# Patient Record
Sex: Male | Born: 1945 | Race: White | Hispanic: No | Marital: Married | Smoking: Current every day smoker
Health system: Southern US, Community
[De-identification: ages and names within clinical notes are randomized; demographics above are authoritative.]

## PROBLEM LIST (undated history)

## (undated) DIAGNOSIS — J309 Allergic rhinitis, unspecified: Secondary | ICD-10-CM

## (undated) DIAGNOSIS — E785 Hyperlipidemia, unspecified: Secondary | ICD-10-CM

## (undated) DIAGNOSIS — I639 Cerebral infarction, unspecified: Secondary | ICD-10-CM

## (undated) DIAGNOSIS — I10 Essential (primary) hypertension: Secondary | ICD-10-CM

## (undated) DIAGNOSIS — J45909 Unspecified asthma, uncomplicated: Secondary | ICD-10-CM

## (undated) DIAGNOSIS — I779 Disorder of arteries and arterioles, unspecified: Secondary | ICD-10-CM

## (undated) DIAGNOSIS — I771 Stricture of artery: Secondary | ICD-10-CM

## (undated) DIAGNOSIS — J449 Chronic obstructive pulmonary disease, unspecified: Secondary | ICD-10-CM

## (undated) HISTORY — PX: APPENDECTOMY: SHX54

---

## 2006-06-18 ENCOUNTER — Ambulatory Visit: Payer: Self-pay | Admitting: Nurse Practitioner

## 2006-07-26 ENCOUNTER — Ambulatory Visit: Payer: Self-pay | Admitting: Anesthesiology

## 2006-08-01 ENCOUNTER — Encounter: Payer: Self-pay | Admitting: Pain Medicine

## 2006-08-15 ENCOUNTER — Ambulatory Visit: Payer: Self-pay | Admitting: Anesthesiology

## 2006-10-13 ENCOUNTER — Ambulatory Visit: Payer: Self-pay | Admitting: Anesthesiology

## 2007-01-11 ENCOUNTER — Ambulatory Visit: Payer: Self-pay | Admitting: Anesthesiology

## 2007-02-06 ENCOUNTER — Ambulatory Visit: Payer: Self-pay | Admitting: Anesthesiology

## 2007-04-06 ENCOUNTER — Ambulatory Visit: Payer: Self-pay | Admitting: Anesthesiology

## 2007-10-23 ENCOUNTER — Ambulatory Visit: Payer: Self-pay | Admitting: Nurse Practitioner

## 2007-11-02 ENCOUNTER — Ambulatory Visit: Payer: Self-pay | Admitting: Nurse Practitioner

## 2007-12-19 ENCOUNTER — Ambulatory Visit: Payer: Self-pay | Admitting: Unknown Physician Specialty

## 2009-06-24 ENCOUNTER — Encounter: Payer: Self-pay | Admitting: Orthopedic Surgery

## 2009-12-31 ENCOUNTER — Ambulatory Visit: Payer: Self-pay | Admitting: Nurse Practitioner

## 2011-04-07 ENCOUNTER — Ambulatory Visit: Payer: Self-pay | Admitting: Nurse Practitioner

## 2012-07-03 ENCOUNTER — Ambulatory Visit: Payer: Self-pay | Admitting: Podiatry

## 2012-07-03 LAB — CBC WITH DIFFERENTIAL/PLATELET
Basophil #: 0.1 10*3/uL (ref 0.0–0.1)
Basophil %: 1 %
Eosinophil %: 1.7 %
HGB: 14.5 g/dL (ref 13.0–18.0)
MCH: 31.2 pg (ref 26.0–34.0)
MCHC: 34.6 g/dL (ref 32.0–36.0)
MCV: 90 fL (ref 80–100)
Neutrophil %: 70 %
RBC: 4.65 10*6/uL (ref 4.40–5.90)

## 2012-07-07 ENCOUNTER — Ambulatory Visit: Payer: Self-pay | Admitting: Podiatry

## 2013-02-02 ENCOUNTER — Ambulatory Visit: Payer: Self-pay | Admitting: Nurse Practitioner

## 2013-04-16 ENCOUNTER — Ambulatory Visit: Payer: Self-pay | Admitting: Gastroenterology

## 2013-04-16 LAB — CBC WITH DIFFERENTIAL/PLATELET
BASOS ABS: 0.1 10*3/uL (ref 0.0–0.1)
Basophil %: 0.7 %
Eosinophil #: 0.2 10*3/uL (ref 0.0–0.7)
Eosinophil %: 1.5 %
HCT: 44.5 % (ref 40.0–52.0)
HGB: 15.3 g/dL (ref 13.0–18.0)
LYMPHS ABS: 1.2 10*3/uL (ref 1.0–3.6)
Lymphocyte %: 11.6 %
MCH: 31.1 pg (ref 26.0–34.0)
MCHC: 34.5 g/dL (ref 32.0–36.0)
MCV: 90 fL (ref 80–100)
MONOS PCT: 8.7 %
Monocyte #: 0.9 x10 3/mm (ref 0.2–1.0)
NEUTROS PCT: 77.5 %
Neutrophil #: 8.3 10*3/uL — ABNORMAL HIGH (ref 1.4–6.5)
PLATELETS: 288 10*3/uL (ref 150–440)
RBC: 4.94 10*6/uL (ref 4.40–5.90)
RDW: 12.9 % (ref 11.5–14.5)
WBC: 10.8 10*3/uL — AB (ref 3.8–10.6)

## 2013-04-16 LAB — PROTIME-INR
INR: 0.9
Prothrombin Time: 12.3 secs (ref 11.5–14.7)

## 2013-04-18 LAB — PATHOLOGY REPORT

## 2014-07-05 NOTE — Op Note (Signed)
PATIENT NAME:  Tommy Knight, Tommy Knight MR#:  [id number] DATE OF BIRTH:  25-Apr-1945  DATE OF PROCEDURE:  07/07/2012  PREOPERATIVE DIAGNOSES: 1.  Hallux valgus deformity, left foot.  2.  Hammertoe deformity, left second toe.   POSTOPERATIVE DIAGNOSES:  1.  Hallux valgus deformity, left foot.  2.  Hammertoe deformity, left second toe.   PROCEDURES: 1.  Austin hallux valgus correction, left foot.  2.  Hammertoe repair, left second toe.   SURGEON: Linus Galas, DPM  ANESTHESIA: LMA.   HEMOSTASIS: Pneumatic tourniquet, left ankle, 250 mmHg.   ESTIMATED BLOOD LOSS: Minimal.   MATERIALS:  1.  One 25 mm 2.2 Medartis screw.  2.  A 0.045 inch K wire.   PATHOLOGY: None.   COMPLICATIONS: None apparent.   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   DESCRIPTION OF PROCEDURE:  The patient was taken to the operating room and placed on the table in the supine position. Following satisfactory sedation, the left foot was anesthetized with 18 mL of 0.5% Sensorcaine plain around the left first and second metatarsals. Pneumatic tourniquet was applied at the level of the left ankle and the foot was prepped and draped in the usual sterile fashion. At this point, the patient was noted to have some significant movement and inability to stay calm so he was switched over to a LMA anesthetic. The foot was exsanguinated and the tourniquet inflated to 250 mmHg.   Attention was then directed to the dorsal aspect of the left foot where an approximate 5 cm linear incision was made coursing proximal to distal, centered over the first metatarsal and metatarsophalangeal joint. The incision was deepened via sharp and blunt dissection down to the level of the joint where a linear capsulotomy was performed. Capsular and periosteal tissue was reflected off of the medial and  dorsal head of the first metatarsal. There was noted to be some degenerative changes along the medial articular cartilage with a dorsal and medial spur. Using a pneumatic saw, the medial and dorsal prominences were resected and removed in toto. A 0.045 inch K wire was then driven from medial to lateral as an axis guide. A V-shaped Austin osteotomy was then performed through the head of the first metatarsal and head freely mobilized.   Attention was then directed to the lateral aspect of the joint where the incision was deepened down to the level of the transverse intermetatarsal ligament, which was incised. A lateral capsulotomy was performed with freeing of the sesamoid apparatus as well as detachment of the adductor tendon. Good release of the contracture on the great toe was noted. Attention was then directed back medially where the head of the metatarsal was transposed laterally and fixated using a 2.2 mm x 25 mm length Medartis screw. Intraoperative FluoroScan views revealed good reduction of the deformity and fixation. The redundant bone edges were removed and rasped smooth and the wound was flushed with copious amounts of sterile saline. The wound was then closed with 4-0 running Vicryl suture for all layers from capsular and deep to superficial subcutaneous and skin closure.   Attention was then directed to the dorsal aspect of the second toe where an approximate 2 cm elliptical incision was made over the proximal interphalangeal joint. The skin wedge was removed and the incision was deepened down to the level of the joint where  a transverse tenotomy was performed. Capsular and periosteal tissue was reflected off the head of the proximal phalanx. The head was then resected in toto using a pneumatic saw. There was noted to be good release of the contracture on the second toe. A 0.045 inch K wire was then driven through the middle and distal phalanx, distally out the tip of the toe, and then retrograded  proximally into the proximal phalanx. Intraoperative FluoroScan views revealed good reduction of the deformity and K wire placement. The wound was then flushed with copious amounts of sterile saline and closed using 4-0 Vicryl simple interrupted suture for tendon reapproximation followed by skin closure using 5-0 nylon simple interrupted sutures. Tincture of benzoin and Steri-Strips were applied to the first metatarsal incision followed by Xeroform and a sterile bandage to the second. The tourniquet was released and blood flow noted to return immediately to the left foot in all digits. A plaster posterior splint was then applied to the left lower extremity with the foot 90 degrees relative to the leg. The patient tolerated the procedure and anesthesia well and was transported to the PAC-U with vital signs stable and in good condition.   ____________________________ Linus Galasodd Ishaq Maffei, DPM tc:sb D: 07/07/2012 12:23:37 ET T: 07/07/2012 13:19:05 ET JOB#: [id number]  cc: Linus Galasodd Burhanuddin Kohlmann, DPM, <Dictator> Danetta Prom DPM ELECTRONICALLY SIGNED 07/20/2012 8:22

## 2015-04-18 ENCOUNTER — Other Ambulatory Visit: Payer: Self-pay | Admitting: Family Medicine

## 2017-04-05 ENCOUNTER — Other Ambulatory Visit: Payer: Self-pay | Admitting: Family Medicine

## 2017-04-05 DIAGNOSIS — R1011 Right upper quadrant pain: Secondary | ICD-10-CM

## 2017-04-12 ENCOUNTER — Ambulatory Visit
Admission: RE | Admit: 2017-04-12 | Discharge: 2017-04-12 | Disposition: A | Discharge status: Home / Self Care | Payer: Medicare HMO | Source: Ambulatory Visit | Attending: Family Medicine | Admitting: Family Medicine

## 2017-04-12 DIAGNOSIS — R1011 Right upper quadrant pain: Secondary | ICD-10-CM | POA: Diagnosis present

## 2017-04-12 DIAGNOSIS — K824 Cholesterolosis of gallbladder: Secondary | ICD-10-CM | POA: Diagnosis not present

## 2017-04-14 ENCOUNTER — Other Ambulatory Visit: Payer: Self-pay | Admitting: General Surgery

## 2017-04-14 DIAGNOSIS — K824 Cholesterolosis of gallbladder: Secondary | ICD-10-CM

## 2017-04-22 ENCOUNTER — Ambulatory Visit
Admission: RE | Admit: 2017-04-22 | Discharge: 2017-04-22 | Disposition: A | Discharge status: Home / Self Care | Payer: Medicare HMO | Source: Ambulatory Visit | Attending: General Surgery | Admitting: General Surgery

## 2017-04-22 DIAGNOSIS — K824 Cholesterolosis of gallbladder: Secondary | ICD-10-CM

## 2017-04-22 DIAGNOSIS — I700 Atherosclerosis of aorta: Secondary | ICD-10-CM | POA: Insufficient documentation

## 2017-04-22 HISTORY — DX: Unspecified asthma, uncomplicated: J45.909

## 2017-04-22 MED ORDER — IOPAMIDOL (ISOVUE-300) INJECTION 61%
100.0000 mL | Freq: Once | INTRAVENOUS | Status: AC | PRN
  Administered 2017-04-22: 100 mL via INTRAVENOUS

## 2017-05-17 ENCOUNTER — Ambulatory Visit: Payer: Self-pay | Admitting: Urology

## 2017-06-15 ENCOUNTER — Ambulatory Visit: Payer: Self-pay | Admitting: Urology

## 2017-06-18 ENCOUNTER — Emergency Department
Admission: EM | Admit: 2017-06-18 | Discharge: 2017-06-18 | Disposition: A | Discharge status: Home / Self Care | Payer: Medicare HMO | Attending: Emergency Medicine | Admitting: Emergency Medicine

## 2017-06-18 ENCOUNTER — Other Ambulatory Visit: Payer: Self-pay

## 2017-06-18 DIAGNOSIS — J45909 Unspecified asthma, uncomplicated: Secondary | ICD-10-CM | POA: Insufficient documentation

## 2017-06-18 DIAGNOSIS — F17200 Nicotine dependence, unspecified, uncomplicated: Secondary | ICD-10-CM | POA: Diagnosis not present

## 2017-06-18 DIAGNOSIS — R1084 Generalized abdominal pain: Secondary | ICD-10-CM | POA: Insufficient documentation

## 2017-06-18 DIAGNOSIS — R1030 Lower abdominal pain, unspecified: Secondary | ICD-10-CM | POA: Diagnosis present

## 2017-06-18 LAB — CBC
HCT: 44.1 % (ref 40.0–52.0)
Hemoglobin: 15 g/dL (ref 13.0–18.0)
MCH: 29.4 pg (ref 26.0–34.0)
MCHC: 33.9 g/dL (ref 32.0–36.0)
MCV: 86.8 fL (ref 80.0–100.0)
PLATELETS: 299 10*3/uL (ref 150–440)
RBC: 5.08 MIL/uL (ref 4.40–5.90)
RDW: 12.8 % (ref 11.5–14.5)
WBC: 8.2 10*3/uL (ref 3.8–10.6)

## 2017-06-18 LAB — LIPASE, BLOOD: Lipase: 27 U/L (ref 11–51)

## 2017-06-18 LAB — URINALYSIS, COMPLETE (UACMP) WITH MICROSCOPIC
Bacteria, UA: NONE SEEN
Bilirubin Urine: NEGATIVE
GLUCOSE, UA: NEGATIVE mg/dL
HGB URINE DIPSTICK: NEGATIVE
Ketones, ur: NEGATIVE mg/dL
Leukocytes, UA: NEGATIVE
Nitrite: NEGATIVE
PROTEIN: NEGATIVE mg/dL
Specific Gravity, Urine: 1.004 — ABNORMAL LOW (ref 1.005–1.030)
pH: 6 (ref 5.0–8.0)

## 2017-06-18 LAB — COMPREHENSIVE METABOLIC PANEL
ALK PHOS: 59 U/L (ref 38–126)
ALT: 25 U/L (ref 17–63)
AST: 28 U/L (ref 15–41)
Albumin: 3.9 g/dL (ref 3.5–5.0)
Anion gap: 7 (ref 5–15)
BILIRUBIN TOTAL: 1 mg/dL (ref 0.3–1.2)
BUN: 16 mg/dL (ref 6–20)
CALCIUM: 8.9 mg/dL (ref 8.9–10.3)
CO2: 26 mmol/L (ref 22–32)
CREATININE: 1.07 mg/dL (ref 0.61–1.24)
Chloride: 102 mmol/L (ref 101–111)
Glucose, Bld: 131 mg/dL — ABNORMAL HIGH (ref 65–99)
Potassium: 4.1 mmol/L (ref 3.5–5.1)
Sodium: 135 mmol/L (ref 135–145)
Total Protein: 7.1 g/dL (ref 6.5–8.1)

## 2017-06-18 MED ORDER — DICYCLOMINE HCL 10 MG PO CAPS
10.0000 mg | ORAL_CAPSULE | Freq: Once | ORAL | Status: AC
  Administered 2017-06-18: 10 mg via ORAL
  Filled 2017-06-18: qty 1

## 2017-06-18 MED ORDER — DICYCLOMINE HCL 20 MG PO TABS: 20.0000 mg | ORAL_TABLET | Freq: Three times a day (TID) | ORAL | 0 refills | Status: DC | PRN

## 2017-06-18 NOTE — ED Triage Notes (Signed)
Pt states abdominal pain, pointing across lower abd. Had CT and US done 3 weeks ago. States his doctor is being treated for ulcers. States pain started on RLQ but is mostly on LLQ. "the last few days it feels like someone is stabbing me in my kidneys." pt states urinary frequency. Denies blood in stool. Denies vomiting. Denies diarrhea. States recent constipation, last BM this AM. "I'm regular every day same time."   Alert, oriented, ambulatory.

## 2017-06-18 NOTE — Discharge Instructions (Addendum)
Please seek medical attention for any high fevers, chest pain, shortness of breath, change in behavior, persistent vomiting, bloody stool or any other new or concerning symptoms.  

## 2017-06-18 NOTE — ED Provider Notes (Signed)
[facility] Emergency Department Provider Note  ____________________________________________   I have reviewed the triage vital signs and the nursing notes.   HISTORY  Chief Complaint Abdominal Pain   History limited by: Not Limited   HPI Tommy Knight is a 72 y.o. male who presents to the emergency department today at the request of his doctor because of concern for continued abdominal pain. Patient states that he has been having abdominal pain for months. Patient says that he initially had the pain in his right upper quadrant. It then moved to the left upper quadrant and is now in his lower abdomen and in his flanks. He has not had any diarrhea. Has been worked up for this with CT scan and RUQ US which he states have not shown the etiology of the pain. Has been put on sucralfate and antacid which he states has not helped.    Per medical record review patient has a history of asthma.   Past Medical History:  Diagnosis Date  . Asthma     There are no active problems to display for this patient.   History reviewed. No pertinent surgical history.  Prior to Admission medications   Not on File    Allergies Patient has no known allergies.  History reviewed. No pertinent family history.  Social History Social History   Tobacco Use  . Smoking status: Current Every Day Smoker  Substance Use Topics  . Alcohol use: Yes    Comment: few beers   . Drug use: Not on file    Review of Systems Constitutional: No fever/chills Eyes: No visual changes. ENT: No sore throat. Cardiovascular: Denies chest pain. Respiratory: Denies shortness of breath. Gastrointestinal: Positive for abdominal pain. Genitourinary: Negative for dysuria. Musculoskeletal: Negative for back pain. Skin: Negative for rash. Neurological: Negative for headaches, focal weakness or numbness.  ____________________________________________   PHYSICAL EXAM:  VITAL SIGNS: ED Triage  Vitals  Enc Vitals Group     BP 06/18/17 1134 106/80     Pulse Rate 06/18/17 1134 81     Resp 06/18/17 1134 20     Temp 06/18/17 1134 98.9 F (37.2 C)     Temp Source 06/18/17 1134 Oral     SpO2 06/18/17 1134 93 %     Weight 06/18/17 1134 181 lb (82.1 kg)     Height 06/18/17 1134 5' 10.5" (1.791 m)     Head Circumference --      Peak Flow --      Pain Score 06/18/17 1138 6   Constitutional: Alert and oriented. Well appearing and in no distress. Eyes: Conjunctivae are normal.  ENT   Head: Normocephalic and atraumatic.   Nose: No congestion/rhinnorhea.   Mouth/Throat: Mucous membranes are moist.   Neck: No stridor. Hematological/Lymphatic/Immunilogical: No cervical lymphadenopathy. Cardiovascular: Normal rate, regular rhythm.  No murmurs, rubs, or gallops. Respiratory: Normal respiratory effort without tachypnea nor retractions. Breath sounds are clear and equal bilaterally. No wheezes/rales/rhonchi. Gastrointestinal: Soft and tender to palpation generalized. No rebound. No guarding.  Genitourinary: Deferred Musculoskeletal: Normal range of motion in all extremities. No lower extremity edema. Neurologic:  Normal speech and language. No gross focal neurologic deficits are appreciated.  Skin:  Skin is warm, dry and intact. No rash noted. Psychiatric: Mood and affect are normal. Speech and behavior are normal. Patient exhibits appropriate insight and judgment.  ____________________________________________    LABS (pertinent positives/negatives)  Lipase 27 CBC wnl CMP wnl except glu 131 UA not consistent with infection.  No blood.  ____________________________________________   EKG  None  ____________________________________________    RADIOLOGY  None  ____________________________________________   PROCEDURES  Procedures  ____________________________________________   INITIAL IMPRESSION / ASSESSMENT AND PLAN / ED COURSE  Pertinent labs &  imaging results that were available during my care of the patient were reviewed by me and considered in my medical decision making (see chart for details).  Patient presented to the emergency department today because of concerns for abdominal pain.  This has been an ongoing issue for the past few months and patient is Tommy Knight undergone CT and right upper quadrant ultrasound.  Blood work and urine today without concerning findings.  Patient afebrile.  At this point I do not feel repeat imaging would be helpful.  Patient was given Bentyl and did feel improvement afterwards.  Discussed with patient importance of continuing to follow-up with GI.  Will give patient prescription for Bentyl.  _________________________________________   FINAL CLINICAL IMPRESSION(S) / ED DIAGNOSES  Final diagnoses:  Generalized abdominal pain     Note: This dictation was prepared with Dragon dictation. Any transcriptional errors that result from this process are unintentional     Phineas Semen, MD 06/18/17 1859

## 2017-07-07 ENCOUNTER — Ambulatory Visit: Payer: Self-pay | Admitting: Urology

## 2017-07-18 ENCOUNTER — Encounter: Payer: Self-pay | Admitting: Anesthesiology

## 2017-07-18 ENCOUNTER — Ambulatory Visit: Admission: RE | Admit: 2017-07-18 | Payer: Medicare HMO | Source: Ambulatory Visit | Admitting: Gastroenterology

## 2017-07-18 ENCOUNTER — Encounter: Admission: RE | Payer: Self-pay | Source: Ambulatory Visit

## 2017-07-18 HISTORY — DX: Hyperlipidemia, unspecified: E78.5

## 2017-07-18 HISTORY — DX: Allergic rhinitis, unspecified: J30.9

## 2017-07-18 HISTORY — DX: Chronic obstructive pulmonary disease, unspecified: J44.9

## 2017-07-18 HISTORY — DX: Essential (primary) hypertension: I10

## 2017-07-18 SURGERY — ESOPHAGOGASTRODUODENOSCOPY (EGD) WITH PROPOFOL
Anesthesia: General

## 2017-08-22 ENCOUNTER — Emergency Department: Payer: Medicare HMO

## 2017-08-22 ENCOUNTER — Encounter: Payer: Self-pay | Admitting: Emergency Medicine

## 2017-08-22 ENCOUNTER — Other Ambulatory Visit: Payer: Self-pay

## 2017-08-22 ENCOUNTER — Observation Stay
Admission: EM | Admit: 2017-08-22 | Discharge: 2017-08-23 | Disposition: A | Discharge status: Home / Self Care | Payer: Medicare HMO | Attending: Internal Medicine | Admitting: Internal Medicine

## 2017-08-22 DIAGNOSIS — E785 Hyperlipidemia, unspecified: Secondary | ICD-10-CM | POA: Insufficient documentation

## 2017-08-22 DIAGNOSIS — R2990 Unspecified symptoms and signs involving the nervous system: Secondary | ICD-10-CM

## 2017-08-22 DIAGNOSIS — Z7982 Long term (current) use of aspirin: Secondary | ICD-10-CM | POA: Diagnosis not present

## 2017-08-22 DIAGNOSIS — I639 Cerebral infarction, unspecified: Secondary | ICD-10-CM | POA: Diagnosis present

## 2017-08-22 DIAGNOSIS — F17200 Nicotine dependence, unspecified, uncomplicated: Secondary | ICD-10-CM | POA: Diagnosis not present

## 2017-08-22 DIAGNOSIS — R29704 NIHSS score 4: Secondary | ICD-10-CM | POA: Diagnosis not present

## 2017-08-22 DIAGNOSIS — Z79899 Other long term (current) drug therapy: Secondary | ICD-10-CM | POA: Diagnosis not present

## 2017-08-22 DIAGNOSIS — I6389 Other cerebral infarction: Principal | ICD-10-CM | POA: Insufficient documentation

## 2017-08-22 DIAGNOSIS — R202 Paresthesia of skin: Secondary | ICD-10-CM | POA: Diagnosis not present

## 2017-08-22 DIAGNOSIS — E875 Hyperkalemia: Secondary | ICD-10-CM | POA: Diagnosis not present

## 2017-08-22 DIAGNOSIS — J449 Chronic obstructive pulmonary disease, unspecified: Secondary | ICD-10-CM | POA: Diagnosis not present

## 2017-08-22 DIAGNOSIS — R200 Anesthesia of skin: Secondary | ICD-10-CM | POA: Diagnosis not present

## 2017-08-22 DIAGNOSIS — Z7951 Long term (current) use of inhaled steroids: Secondary | ICD-10-CM | POA: Insufficient documentation

## 2017-08-22 DIAGNOSIS — I10 Essential (primary) hypertension: Secondary | ICD-10-CM | POA: Insufficient documentation

## 2017-08-22 DIAGNOSIS — F1010 Alcohol abuse, uncomplicated: Secondary | ICD-10-CM | POA: Diagnosis not present

## 2017-08-22 DIAGNOSIS — R531 Weakness: Secondary | ICD-10-CM | POA: Insufficient documentation

## 2017-08-22 LAB — DIFFERENTIAL
BASOS ABS: 0.1 10*3/uL (ref 0–0.1)
Basophils Relative: 1 %
Eosinophils Absolute: 0.3 10*3/uL (ref 0–0.7)
Eosinophils Relative: 3 %
LYMPHS ABS: 1.5 10*3/uL (ref 1.0–3.6)
LYMPHS PCT: 20 %
Monocytes Absolute: 0.8 10*3/uL (ref 0.2–1.0)
Monocytes Relative: 10 %
Neutro Abs: 4.8 10*3/uL (ref 1.4–6.5)
Neutrophils Relative %: 66 %

## 2017-08-22 LAB — CBC
HCT: 45.1 % (ref 40.0–52.0)
HEMOGLOBIN: 15.4 g/dL (ref 13.0–18.0)
MCH: 29.8 pg (ref 26.0–34.0)
MCHC: 34.1 g/dL (ref 32.0–36.0)
MCV: 87.3 fL (ref 80.0–100.0)
Platelets: 314 10*3/uL (ref 150–440)
RBC: 5.17 MIL/uL (ref 4.40–5.90)
RDW: 13 % (ref 11.5–14.5)
WBC: 7.4 10*3/uL (ref 3.8–10.6)

## 2017-08-22 LAB — COMPREHENSIVE METABOLIC PANEL
ALBUMIN: 4.2 g/dL (ref 3.5–5.0)
ALK PHOS: 68 U/L (ref 38–126)
ALT: 23 U/L (ref 17–63)
AST: 25 U/L (ref 15–41)
Anion gap: 9 (ref 5–15)
BILIRUBIN TOTAL: 1 mg/dL (ref 0.3–1.2)
BUN: 21 mg/dL — AB (ref 6–20)
CO2: 25 mmol/L (ref 22–32)
Calcium: 9 mg/dL (ref 8.9–10.3)
Chloride: 101 mmol/L (ref 101–111)
Creatinine, Ser: 1.19 mg/dL (ref 0.61–1.24)
GFR calc Af Amer: >60 mL/min (ref >60)
GFR calc non Af Amer: 59 mL/min — ABNORMAL LOW (ref >60)
GLUCOSE: 112 mg/dL — AB (ref 65–99)
POTASSIUM: 5.3 mmol/L — AB (ref 3.5–5.1)
SODIUM: 135 mmol/L (ref 135–145)
TOTAL PROTEIN: 7.5 g/dL (ref 6.5–8.1)

## 2017-08-22 LAB — PROTIME-INR
INR: 0.92
Prothrombin Time: 12.3 seconds (ref 11.4–15.2)

## 2017-08-22 LAB — TROPONIN I: Troponin I: <0.03 ng/mL (ref <0.03)

## 2017-08-22 LAB — GLUCOSE, CAPILLARY: Glucose-Capillary: 109 mg/dL — ABNORMAL HIGH (ref 65–99)

## 2017-08-22 LAB — APTT: APTT: 46 s — AB (ref 24–36)

## 2017-08-22 MED ORDER — SENNOSIDES-DOCUSATE SODIUM 8.6-50 MG PO TABS: 1.0000 | ORAL_TABLET | Freq: Every evening | ORAL | Status: DC | PRN

## 2017-08-22 MED ORDER — ASPIRIN EC 325 MG PO TBEC
325.0000 mg | DELAYED_RELEASE_TABLET | Freq: Every day | ORAL | Status: DC
  Administered 2017-08-22 – 2017-08-23 (×2): 325 mg via ORAL
  Filled 2017-08-22 (×2): qty 1

## 2017-08-22 MED ORDER — TERAZOSIN HCL 5 MG PO CAPS
5.0000 mg | ORAL_CAPSULE | Freq: Every day | ORAL | Status: DC
  Administered 2017-08-22: 5 mg via ORAL
  Filled 2017-08-22 (×2): qty 1

## 2017-08-22 MED ORDER — ENOXAPARIN SODIUM 40 MG/0.4ML ~~LOC~~ SOLN
40.0000 mg | SUBCUTANEOUS | Status: DC
  Administered 2017-08-22: 40 mg via SUBCUTANEOUS
  Filled 2017-08-22: qty 0.4

## 2017-08-22 MED ORDER — ALBUTEROL SULFATE (2.5 MG/3ML) 0.083% IN NEBU
2.5000 mg | INHALATION_SOLUTION | RESPIRATORY_TRACT | Status: DC | PRN
  Administered 2017-08-22: 20:00:00 2.5 mg via RESPIRATORY_TRACT
  Filled 2017-08-22: qty 3

## 2017-08-22 MED ORDER — ALBUTEROL SULFATE HFA 108 (90 BASE) MCG/ACT IN AERS: 2.0000 | INHALATION_SPRAY | RESPIRATORY_TRACT | Status: DC | PRN

## 2017-08-22 MED ORDER — STROKE: EARLY STAGES OF RECOVERY BOOK
Freq: Once | Status: AC
  Administered 2017-08-22: 19:00:00

## 2017-08-22 MED ORDER — ACETAMINOPHEN 325 MG PO TABS: 650.0000 mg | ORAL_TABLET | ORAL | Status: DC | PRN

## 2017-08-22 MED ORDER — LORAZEPAM 2 MG/ML IJ SOLN: 1.0000 mg | Freq: Four times a day (QID) | INTRAMUSCULAR | Status: DC | PRN

## 2017-08-22 MED ORDER — ACETAMINOPHEN 650 MG RE SUPP: 650.0000 mg | RECTAL | Status: DC | PRN

## 2017-08-22 MED ORDER — MOMETASONE FURO-FORMOTEROL FUM 200-5 MCG/ACT IN AERO
2.0000 | INHALATION_SPRAY | Freq: Two times a day (BID) | RESPIRATORY_TRACT | Status: DC
  Administered 2017-08-22 – 2017-08-23 (×2): 2 via RESPIRATORY_TRACT
  Filled 2017-08-22: qty 8.8

## 2017-08-22 MED ORDER — VITAMIN B-1 100 MG PO TABS
100.0000 mg | ORAL_TABLET | Freq: Every day | ORAL | Status: DC
  Administered 2017-08-22 – 2017-08-23 (×2): 100 mg via ORAL
  Filled 2017-08-22 (×2): qty 1

## 2017-08-22 MED ORDER — SUCRALFATE 1 G PO TABS
1.0000 g | ORAL_TABLET | Freq: Two times a day (BID) | ORAL | Status: DC
  Administered 2017-08-22 – 2017-08-23 (×2): 1 g via ORAL
  Filled 2017-08-22 (×2): qty 1

## 2017-08-22 MED ORDER — THIAMINE HCL 100 MG/ML IJ SOLN
100.0000 mg | Freq: Every day | INTRAMUSCULAR | Status: DC
  Filled 2017-08-22: qty 1

## 2017-08-22 MED ORDER — DICYCLOMINE HCL 20 MG PO TABS
20.0000 mg | ORAL_TABLET | Freq: Three times a day (TID) | ORAL | Status: DC | PRN
  Filled 2017-08-22: qty 1

## 2017-08-22 MED ORDER — MOMETASONE FURO-FORMOTEROL FUM 200-5 MCG/ACT IN AERO
2.0000 | INHALATION_SPRAY | Freq: Two times a day (BID) | RESPIRATORY_TRACT | Status: DC
  Filled 2017-08-22: qty 8.8

## 2017-08-22 MED ORDER — SODIUM POLYSTYRENE SULFONATE 15 GM/60ML PO SUSP
15.0000 g | Freq: Once | ORAL | Status: AC
  Administered 2017-08-22: 15 g via ORAL
  Filled 2017-08-22: qty 60

## 2017-08-22 MED ORDER — PRAVASTATIN SODIUM 40 MG PO TABS
80.0000 mg | ORAL_TABLET | Freq: Every day | ORAL | Status: DC
  Administered 2017-08-22: 23:00:00 80 mg via ORAL
  Filled 2017-08-22 (×2): qty 2
  Filled 2017-08-22: qty 4

## 2017-08-22 MED ORDER — LORAZEPAM 2 MG PO TABS: 0.0000 mg | ORAL_TABLET | Freq: Four times a day (QID) | ORAL | Status: DC

## 2017-08-22 MED ORDER — ACETAMINOPHEN 160 MG/5ML PO SOLN
650.0000 mg | ORAL | Status: DC | PRN
  Filled 2017-08-22: qty 20.3

## 2017-08-22 MED ORDER — ADULT MULTIVITAMIN W/MINERALS CH
1.0000 | ORAL_TABLET | Freq: Every day | ORAL | Status: DC
  Administered 2017-08-22 – 2017-08-23 (×2): 1 via ORAL
  Filled 2017-08-22 (×2): qty 1

## 2017-08-22 MED ORDER — IOPAMIDOL (ISOVUE-370) INJECTION 76%
100.0000 mL | Freq: Once | INTRAVENOUS | Status: AC | PRN
  Administered 2017-08-22: 100 mL via INTRAVENOUS

## 2017-08-22 MED ORDER — LORAZEPAM 1 MG PO TABS: 1.0000 mg | ORAL_TABLET | Freq: Four times a day (QID) | ORAL | Status: DC | PRN

## 2017-08-22 MED ORDER — LORATADINE 10 MG PO TABS
10.0000 mg | ORAL_TABLET | Freq: Every day | ORAL | Status: DC
  Administered 2017-08-22: 10 mg via ORAL
  Filled 2017-08-22: qty 1

## 2017-08-22 MED ORDER — PANTOPRAZOLE SODIUM 40 MG PO TBEC
40.0000 mg | DELAYED_RELEASE_TABLET | Freq: Two times a day (BID) | ORAL | Status: DC
  Administered 2017-08-22 – 2017-08-23 (×2): 40 mg via ORAL
  Filled 2017-08-22 (×2): qty 1

## 2017-08-22 MED ORDER — LORAZEPAM 2 MG PO TABS: 0.0000 mg | ORAL_TABLET | Freq: Two times a day (BID) | ORAL | Status: DC

## 2017-08-22 MED ORDER — NICOTINE 21 MG/24HR TD PT24
21.0000 mg | MEDICATED_PATCH | Freq: Every day | TRANSDERMAL | Status: DC
  Administered 2017-08-22 – 2017-08-23 (×2): 21 mg via TRANSDERMAL
  Filled 2017-08-22 (×2): qty 1

## 2017-08-22 MED ORDER — FOLIC ACID 1 MG PO TABS
1.0000 mg | ORAL_TABLET | Freq: Every day | ORAL | Status: DC
  Administered 2017-08-22 – 2017-08-23 (×2): 1 mg via ORAL
  Filled 2017-08-22 (×2): qty 1

## 2017-08-22 NOTE — ED Triage Notes (Signed)
Intermittent L facial, grip and leg weakness since 6p yesterday.

## 2017-08-22 NOTE — Care Management Note (Signed)
Case Management Note  Patient Details  Name: Tommy LawmanJerry A Cradle MRN: [id number] Date of Birth: 1946/01/06  Subjective/Objective:       Patient admitted to [facility] under observation status for rule out CVA. RNCM consulted on patient to provide MOON letter. Patient lives with spouse [name] [phone] and is able to complete all activities of daily living without assistance. Uses straight cane but denies use for any other DME. Currently reports weakness but has no issues at baseline. No transportation concerns. Utilizes [facility] [place]. PCP is Dr Iran OuchStrader.              Action/Plan:  Patient with acute weakness and eventually may require PT consult. [facility]ided [facility] in case that eventually becomes needed. RNCM will continue to follow. Expected Discharge Date:                  Expected Discharge Plan:     In-House Referral:     Discharge planning Services     Post Acute Care Choice:    Choice offered to:     DME Arranged:    DME Agency:     HH Arranged:    HH Agency:     Status of Service:     If discussed at [employer] Meetings, dates discussed:    Additional Comments:  Virgel ManifoldJosh A Navin Dogan, RN 08/22/2017, 2:54 PM

## 2017-08-22 NOTE — Progress Notes (Signed)
   08/22/17 1100  Clinical Encounter Type  Visited With Family;Patient not available  Visit Type Code  Responded to Code Stroke, saw that family member was with doctor, checked in with unit staff, provided pastoral presence then engaged family.  Spoke with wife and son in Social workerlaw.  Family is awaiting test results.  Chaplain offered emotional support and told family to have nurse page chaplain if needed.

## 2017-08-22 NOTE — Progress Notes (Signed)
Anticoagulation monitoring(Lovenox):  72yo  male ordered Lovenox 30 mg Q24h  Filed Weights   08/22/17 1100  Weight: 178 lb (80.7 kg)   BMI 24.7   Lab Results  Component Value Date   CREATININE 1.19 08/22/2017   CREATININE 1.07 06/18/2017   Estimated Creatinine Clearance: 58.9 mL/min (by C-G formula based on SCr of 1.19 mg/dL). Hemoglobin & Hematocrit     Component Value Date/Time   HGB 15.4 08/22/2017 1124   HGB 15.3 04/16/2013 0852   HCT 45.1 08/22/2017 1124   HCT 44.5 04/16/2013 0852     Per Protocol for Patient with estCrcl > 30 ml/min and BMI < 40, will transition to Lovenox 40 mg Q24h.

## 2017-08-22 NOTE — Care Management Obs Status (Signed)
MEDICARE OBSERVATION STATUS NOTIFICATION   Patient Details  Name: Tommy Knight MRN: [id number] Date of Birth: [birth year]   Medicare Observation Status Notification Given:  Yes  Patient requested wife, Tommy Knight to sign for him  Virgel ManifoldJosh A Sequoia Witz, RN 08/22/2017, 2:48 PM

## 2017-08-22 NOTE — ED Notes (Signed)
Pt is being admitted to 1C bed 107. Report was called and given to Grier MittsBrandi Miller, RN and she verbalized understanding of the report given. Pt will be transported by RN via stretcher.

## 2017-08-22 NOTE — Progress Notes (Addendum)
CODE STROKE- PHARMACY COMMUNICATION   Time CODE STROKE called/page received:1103  Time response to CODE STROKE was made (in person or via phone): 1119  Time Stroke Kit retrieved from [place] (only if needed): N/A. Will call pharmacy if needed  Name of Provider/Nurse contacted:Delicia   Past Medical History:  Diagnosis Date  . Allergic rhinitis   . Asthma   . COPD (chronic obstructive pulmonary disease) ([place])   . Hyperlipidemia   . Hypertension    Prior to Admission medications   Medication Sig Start Date End Date Taking? Authorizing Provider  albuterol (PROVENTIL HFA;VENTOLIN HFA) 108 (90 Base) MCG/ACT inhaler Inhale 2 puffs into the lungs every 4 (four) hours as needed for wheezing or shortness of breath.    Yes [provider]  aspirin EC 81 MG tablet Take 1 tablet by mouth at bedtime.    Yes [provider]  benazepril (LOTENSIN) 40 MG tablet Take 40 mg by mouth at bedtime.    Yes [provider]  Fluticasone-Salmeterol (ADVAIR) 250-50 MCG/DOSE AEPB Inhale 1 puff into the lungs 2 (two) times daily.   Yes [provider]  loratadine (CLARITIN) 10 MG tablet Take 10 mg by mouth at bedtime.    Yes [provider]  pantoprazole (PROTONIX) 40 MG tablet Take 1 tablet by mouth 2 (two) times daily. 05/18/17  Yes [provider]  pravastatin (PRAVACHOL) 80 MG tablet Take 80 mg by mouth at bedtime.    Yes [provider]  sucralfate (CARAFATE) 1 g tablet Take 1 g by mouth 2 (two) times daily.    Yes [provider]  terazosin (HYTRIN) 5 MG capsule Take 5 mg by mouth at bedtime.   Yes [provider]  dicyclomine (BENTYL) 20 MG tablet Take 1 tablet (20 mg total) by mouth 3 (three) times daily as needed (abdominal pain). Patient not taking: Reported on 08/22/2017 06/18/17   Nance Pear, MD  cetirizine (ZYRTEC) 10 MG tablet Take 10 mg by mouth daily.  08/22/17  [provider]    Pernell Dupre  ,PharmD Clinical Pharmacist  08/22/2017  12:26 PM

## 2017-08-22 NOTE — Code Documentation (Signed)
Pt arrives via POV with complaints of left facial droop and left sided weakness, code stroke activated in triage, pt taken to CT for non con head CT, pt cleared for CT by Dr. Derrill KayGoodman at 1105, after non con head CT completed pt taken to room 13, NIHSS 4 with left facial droop, left leg drift, left sided sensory deficit, and unable to state the month, Dr. Thad Rangereynolds at bedside, pt taken back to CT for CTA/CTP, CBG 109, family at bedside, [facility]

## 2017-08-22 NOTE — Progress Notes (Signed)
Pt refused assessment of buttocks and groin area. Pt.  Requested that bed alarm be cut off. Educated pt on rationale of bed alarm and agreed to try keep the bed alarm on the least restrictive setting.

## 2017-08-22 NOTE — Evaluation (Addendum)
Clinical/Bedside Swallow Evaluation Patient Details  Name: Tommy Knight MRN: [id number] Date of Birth: 1945/03/26  Today's Date: 08/22/2017 Time: SLP Start Time (ACUTE ONLY): 1650 SLP Stop Time (ACUTE ONLY): 1750 SLP Time Calculation (min) (ACUTE ONLY): 60 min  Past Medical History:  Past Medical History:  Diagnosis Date  . Allergic rhinitis   . Asthma   . COPD (chronic obstructive pulmonary disease) ([facility])   . Hyperlipidemia   . Hypertension    Past Surgical History:  Past Surgical History:  Procedure Laterality Date  . APPENDECTOMY     HPI:   Pt is a 72 y/o male with a known history of COPD continues to smoke hyperlipidemia, hypertension and allergic rhinitis is presenting to the ED with a chief complaint of left upper and lower extremity weakness associated with some tingling and numbness.  Patient symptoms were started yesterday evening but he did not come to the hospital patient waited until today morning and when the symptoms are getting worse he came into the ED.  Initial CT scan is negative.  During my examination patient is still complaining of left-sided weakness but denies any headache or blurry vision.    Assessment / Plan / Recommendation Clinical Impression  Pt appears to present w/ adequate oropharyngeal phase swallow function w/ reduced risk for aspiration noted w/ thin liquids and solids when following general aspiration precautions. Pt consumed po trials of thin liquids and solid foods w/ no overt s/s of aspiration noted; clear vocal quality b/t trials. [facility]. Pt fed self w/ no ovet deficits or weakness noted. Pt stated he felt "nearly back to my baseline". Pt denied any issues w/ his speech or language; pt was conversing w/ family at conversational level w/ no overt deficits noted. OM exam appeared [facility]. Recommend a regular diet w/ general aspiration precautions. No further skilled ST services indicated at this time. Diet consistency was placed; NSG updated  and will reconsult ST services if needed while admitted. Pt/family agreed.  SLP Visit Diagnosis: (none)    Aspiration Risk  (reduced)    Diet Recommendation  Regular diet w/ thin liquids; general aspiration precautions  Medication Administration: Whole meds with liquid    Other  Recommendations Recommended Consults: (n/a) Oral Care Recommendations: Oral care BID;Patient independent with oral care Other Recommendations: (n/a)   Follow up Recommendations None      Frequency and Duration (n/a)  (n/a)       Prognosis Prognosis for Safe Diet Advancement: Good Barriers to Reach Goals: (none)      Swallow Study   General Date of Onset: 08/22/17 Type of Study: Bedside Swallow Evaluation Previous Swallow Assessment: none reported Diet Prior to this Study: NPO(regular diet at home) Temperature Spikes Noted: No Respiratory Status: Room air History of Recent Intubation: No Behavior/Cognition: Alert;Cooperative;Pleasant mood Oral Cavity Assessment: Within Functional Limits;Dry(min) Oral Care Completed by SLP: Recent completion by staff Oral Cavity - Dentition: Adequate natural dentition Vision: Functional for self-feeding Self-Feeding Abilities: Able to feed self Patient Positioning: Upright in bed Baseline Vocal Quality: Normal Volitional Cough: Strong Volitional Swallow: Able to elicit    Oral/Motor/Sensory Function Overall Oral Motor/Sensory Function: Within functional limits   Ice Chips Ice chips: Not tested   Thin Liquid Thin Liquid: Within functional limits Presentation: Self Fed;Cup;Straw(~4 ozs)    Nectar Thick Nectar Thick Liquid: Not tested   Honey Thick Honey Thick Liquid: Not tested   Puree Puree: Not tested   Solid   GO   Solid: Within functional  limits Presentation: Self Fed;Spoon(5-6 boluses) Other Comments: dinner meal        Jerilynn Som, MS, CCC-SLP Cerria Randhawa 08/22/2017,6:47 PM

## 2017-08-22 NOTE — H&P (Signed)
[facility] [place] [facility]   PATIENT NAME: Tommy Knight    MR#:  [id number]  DATE OF BIRTH:  [birth year]  DATE OF ADMISSION:  08/22/2017  PRIMARY CARE PHYSICIAN: Erasmo DownerStrader, Lindsey F, NP   REQUESTING/REFERRING PHYSICIAN: Derrill KayGoodman  CHIEF COMPLAINT:  Left-sided weakness  HISTORY OF PRESENT ILLNESS:  Tommy RinkJerry Knight  is a 72 y.o. male with a known history of COPD continues to smoke hyperlipidemia, hypertension and allergic rhinitis is presenting to the ED with a chief complaint of left upper and lower extremity weakness associated with some tingling and numbness.  Patient symptoms were started yesterday evening but he did not come to the hospital patient waited until today morning and when the symptoms are getting worse he came into the ED.  Initial CT scan is negative.  During my examination patient is still complaining of left-sided weakness but denies any headache or blurry vision.  No speech deficits.  Passed bedside swallow evaluation  PAST MEDICAL HISTORY:   Past Medical History:  Diagnosis Date  . Allergic rhinitis   . Asthma   . COPD (chronic obstructive pulmonary disease) (HCC)   . Hyperlipidemia   . Hypertension     PAST SURGICAL HISTOIRY:   Past Surgical History:  Procedure Laterality Date  . APPENDECTOMY      SOCIAL HISTORY:   Social History   Tobacco Use  . Smoking status: Current Every Day Smoker  . Smokeless tobacco: Never Used  Substance Use Topics  . Alcohol use: Yes    Comment: few beers     FAMILY HISTORY:  No family history on file.  DRUG ALLERGIES:  No Known Allergies  REVIEW OF SYSTEMS:  CONSTITUTIONAL: No fever, fatigue or weakness.  EYES: No blurred or double vision.  EARS, NOSE, AND THROAT: No tinnitus or ear pain.  RESPIRATORY: No cough, shortness of breath, wheezing or hemoptysis.  CARDIOVASCULAR: No chest pain, orthopnea, edema.  GASTROINTESTINAL: No nausea, vomiting, diarrhea or abdominal pain.   GENITOURINARY: No dysuria, hematuria.  ENDOCRINE: No polyuria, nocturia,  HEMATOLOGY: No anemia, easy bruising or bleeding SKIN: No rash or lesion. MUSCULOSKELETAL: Reporting left-sided weakness no joint pain or arthritis.   NEUROLOGIC: No tingling, numbness PSYCHIATRY: No anxiety or depression.   MEDICATIONS AT HOME:   Prior to Admission medications   Medication Sig Start Date End Date Taking? Authorizing Provider  albuterol (PROVENTIL HFA;VENTOLIN HFA) 108 (90 Base) MCG/ACT inhaler Inhale 2 puffs into the lungs every 4 (four) hours as needed for wheezing or shortness of breath.    Yes [provider]  aspirin EC 81 MG tablet Take 1 tablet by mouth at bedtime.    Yes [provider]  benazepril (LOTENSIN) 40 MG tablet Take 40 mg by mouth at bedtime.    Yes [provider]  Fluticasone-Salmeterol (ADVAIR) 250-50 MCG/DOSE AEPB Inhale 1 puff into the lungs 2 (two) times daily.   Yes [provider]  loratadine (CLARITIN) 10 MG tablet Take 10 mg by mouth at bedtime.    Yes [provider]  pantoprazole (PROTONIX) 40 MG tablet Take 1 tablet by mouth 2 (two) times daily. 05/18/17  Yes [provider]  pravastatin (PRAVACHOL) 80 MG tablet Take 80 mg by mouth at bedtime.    Yes [provider]  sucralfate (CARAFATE) 1 g tablet Take 1 g by mouth 2 (two) times daily.    Yes [provider]  terazosin (HYTRIN) 5 MG capsule Take 5 mg by mouth at bedtime.  Yes [provider]  dicyclomine (BENTYL) 20 MG tablet Take 1 tablet (20 mg total) by mouth 3 (three) times daily as needed (abdominal pain). Patient not taking: Reported on 08/22/2017 06/18/17   Phineas Semen, MD      VITAL SIGNS:  Blood pressure 131/86, pulse (!) 58, temperature 98.4 F (36.9 C), resp. rate 18, height 5\' 11"  (1.803 m), weight 80.7 kg (178 lb), SpO2 97 %.  PHYSICAL EXAMINATION:  GENERAL:  72 y.o.-year-old patient lying in the bed with no acute  distress.  EYES: Pupils equal, round, reactive to light and accommodation. No scleral icterus. Extraocular muscles intact.  HEENT: Head atraumatic, normocephalic. Oropharynx and nasopharynx clear.  NECK:  Supple, no jugular venous distention. No thyroid enlargement, no tenderness.  LUNGS: Normal breath sounds bilaterally, no wheezing, rales,rhonchi or crepitation. No use of accessory muscles of respiration.  CARDIOVASCULAR: S1, S2 normal. No murmurs, rubs, or gallops.  ABDOMEN: Soft, nontender, nondistended. Bowel sounds present. No organomegaly or mass.  EXTREMITIES: No pedal edema, cyanosis, or clubbing.  NEUROLOGIC: Cranial nerves II through XII are intact. Muscle strength 5/5 in all extremities. Sensation intact. Gait not checked.  PSYCHIATRIC: The patient is alert and oriented x 3.  SKIN: No obvious rash, lesion, or ulcer.   LABORATORY PANEL:   CBC Recent Labs  Lab 08/22/17 1124  WBC 7.4  HGB 15.4  HCT 45.1  PLT 314   ------------------------------------------------------------------------------------------------------------------  Chemistries  Recent Labs  Lab 08/22/17 1124  NA 135  K 5.3*  CL 101  CO2 25  GLUCOSE 112*  BUN 21*  CREATININE 1.19  CALCIUM 9.0  AST 25  ALT 23  ALKPHOS 68  BILITOT 1.0   ------------------------------------------------------------------------------------------------------------------  Cardiac Enzymes Recent Labs  Lab 08/22/17 1124  TROPONINI <0.03   ------------------------------------------------------------------------------------------------------------------  RADIOLOGY:  Ct Angio Head W Or Wo Contrast  Result Date: 08/22/2017 CLINICAL DATA:  Blurred vision and left-sided weakness. EXAM: CT ANGIOGRAPHY HEAD AND NECK CT PERFUSION BRAIN TECHNIQUE: Multidetector CT imaging of the head and neck was performed using the standard protocol during bolus administration of intravenous contrast. Multiplanar CT image reconstructions and  MIPs were obtained to evaluate the vascular anatomy. Carotid stenosis measurements (when applicable) are obtained utilizing NASCET criteria, using the distal internal carotid diameter as the denominator. Multiphase CT imaging of the brain was performed following IV bolus contrast injection. Subsequent parametric perfusion maps were calculated using RAPID software. CONTRAST:  ISOVUE-370 IOPAMIDOL (ISOVUE-370) INJECTION 76% COMPARISON:  Noncontrast head CT earlier today. Carotid Doppler ultrasound 02/02/2013. FINDINGS: CTA NECK FINDINGS Aortic arch: Standard 3 vessel aortic arch with moderate atherosclerotic plaque. The brachiocephalic artery is widely patent. Extensive calcified plaque results in moderate proximal right subclavian artery stenosis. Heavily calcified plaque in the left subclavian artery beginning just beyond its origin completely obscures the vessel, which may be occluded through the level of the left vertebral artery origin. The left subclavian artery is patent more distally. Right carotid system: Patent with scattered nonstenotic plaque throughout the common carotid artery. Calcified plaque about the carotid bifurcation results in 55% ICA origin stenosis. Left carotid system: Patent with motion artifact limiting assessment of the common carotid artery origin. Scattered mild nonstenotic plaque throughout the common carotid artery with more extensive, heavily calcified plaque about the carotid bifurcation resulting in 50% proximal ICA stenosis. Tortuous distal cervical ICA. Vertebral arteries: The right vertebral artery is patent with scattered nonstenotic plaque. Above described heavily calcified plaque in the left subclavian artery extends into the proximal left vertebral artery,  which is occluded. There is reconstitution of the right V2 and V3 segments which also demonstrate moderate atherosclerosis. Skeleton: Moderate C1-2 arthropathy. Moderate focal disc degeneration at C5-6. Other neck: No  mass or enlarged lymph nodes. Upper chest: Small calcified granulomas in the left upper lobe of the lung. Review of the MIP images confirms the above findings CTA HEAD FINDINGS Anterior circulation: The internal carotid arteries are patent from skull base to carotid termini with moderately extensive siphon atherosclerosis resulting in mild-to-moderate cavernous stenoses bilaterally. ACAs and MCAs are patent without evidence of proximal branch occlusion or significant A1 or M1 stenosis. Atherosclerotic branch vessel irregularity is noted including mild proximal right M2 stenoses. No aneurysm is identified. Posterior circulation: The intracranial vertebral arteries are patent with the right being dominant. Patent AICA and SCA origins are identified bilaterally. The basilar artery is widely patent. There is a diminutive left posterior communicating artery. The PCAs demonstrate moderate branch vessel atherosclerotic changes without significant P1 or proximal P2 stenosis. No aneurysm is identified. Venous sinuses: Patent. Anatomic variants: None. Delayed phase: Not performed. Review of the MIP images confirms the above findings CT Brain Perfusion Findings: CBF (<30%) Volume: 0 mL Perfusion (Tmax>6.0s) volume: 0 mL Mismatch Volume: n/a Infarction Location: None IMPRESSION: 1. No acute infarct evident on perfusion imaging. 2. 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 3. 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 4. 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 5. 50% proximal left ICA stenosis. 6. Widely patent right vertebral artery. 7. Mild-to-moderate bilateral intracranial ICA stenoses. 8.  Aortic Atherosclerosis (ICD10-I70.0). Electronically Signed   By: Sebastian Ache M.D.   On: 08/22/2017 12:35   Ct Angio Neck W Or Wo Contrast  Result Date: 08/22/2017 CLINICAL DATA:  Blurred vision  and left-sided weakness. EXAM: CT ANGIOGRAPHY HEAD AND NECK CT PERFUSION BRAIN TECHNIQUE: Multidetector CT imaging of the head and neck was performed using the standard protocol during bolus administration of intravenous contrast. Multiplanar CT image reconstructions and MIPs were obtained to evaluate the vascular anatomy. Carotid stenosis measurements (when applicable) are obtained utilizing NASCET criteria, using the distal internal carotid diameter as the denominator. Multiphase CT imaging of the brain was performed following IV bolus contrast injection. Subsequent parametric perfusion maps were calculated using RAPID software. CONTRAST:  ISOVUE-370 IOPAMIDOL (ISOVUE-370) INJECTION 76% COMPARISON:  Noncontrast head CT earlier today. Carotid Doppler ultrasound 02/02/2013. FINDINGS: CTA NECK FINDINGS Aortic arch: Standard 3 vessel aortic arch with moderate atherosclerotic plaque. The brachiocephalic artery is widely patent. Extensive calcified plaque results in moderate proximal right subclavian artery stenosis. Heavily calcified plaque in the left subclavian artery beginning just beyond its origin completely obscures the vessel, which may be occluded through the level of the left vertebral artery origin. The left subclavian artery is patent more distally. Right carotid system: Patent with scattered nonstenotic plaque throughout the common carotid artery. Calcified plaque about the carotid bifurcation results in 55% ICA origin stenosis. Left carotid system: Patent with motion artifact limiting assessment of the common carotid artery origin. Scattered mild nonstenotic plaque throughout the common carotid artery with more extensive, heavily calcified plaque about the carotid bifurcation resulting in 50% proximal ICA stenosis. Tortuous distal cervical ICA. Vertebral arteries: The right vertebral artery is patent with scattered nonstenotic plaque. Above described heavily calcified plaque in the left subclavian  artery extends into the proximal left vertebral artery, which is occluded. There is reconstitution of the right  V2 and V3 segments which also demonstrate moderate atherosclerosis. Skeleton: Moderate C1-2 arthropathy. Moderate focal disc degeneration at C5-6. Other neck: No mass or enlarged lymph nodes. Upper chest: Small calcified granulomas in the left upper lobe of the lung. Review of the MIP images confirms the above findings CTA HEAD FINDINGS Anterior circulation: The internal carotid arteries are patent from skull base to carotid termini with moderately extensive siphon atherosclerosis resulting in mild-to-moderate cavernous stenoses bilaterally. ACAs and MCAs are patent without evidence of proximal branch occlusion or significant A1 or M1 stenosis. Atherosclerotic branch vessel irregularity is noted including mild proximal right M2 stenoses. No aneurysm is identified. Posterior circulation: The intracranial vertebral arteries are patent with the right being dominant. Patent AICA and SCA origins are identified bilaterally. The basilar artery is widely patent. There is a diminutive left posterior communicating artery. The PCAs demonstrate moderate branch vessel atherosclerotic changes without significant P1 or proximal P2 stenosis. No aneurysm is identified. Venous sinuses: Patent. Anatomic variants: None. Delayed phase: Not performed. Review of the MIP images confirms the above findings CT Brain Perfusion Findings: CBF (<30%) Volume: 0 mL Perfusion (Tmax>6.0s) volume: 0 mL Mismatch Volume: n/a Infarction Location: None IMPRESSION: 1. No acute infarct evident on perfusion imaging. 2. 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 3. 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 4. 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 5. 50% proximal left ICA stenosis. 6. Widely patent right  vertebral artery. 7. Mild-to-moderate bilateral intracranial ICA stenoses. 8.  Aortic Atherosclerosis (ICD10-I70.0). Electronically Signed   By: Sebastian Ache M.D.   On: 08/22/2017 12:35   Ct Cerebral Perfusion W Contrast  Result Date: 08/22/2017 CLINICAL DATA:  Blurred vision and left-sided weakness. EXAM: CT ANGIOGRAPHY HEAD AND NECK CT PERFUSION BRAIN TECHNIQUE: Multidetector CT imaging of the head and neck was performed using the standard protocol during bolus administration of intravenous contrast. Multiplanar CT image reconstructions and MIPs were obtained to evaluate the vascular anatomy. Carotid stenosis measurements (when applicable) are obtained utilizing NASCET criteria, using the distal internal carotid diameter as the denominator. Multiphase CT imaging of the brain was performed following IV bolus contrast injection. Subsequent parametric perfusion maps were calculated using RAPID software. CONTRAST:  ISOVUE-370 IOPAMIDOL (ISOVUE-370) INJECTION 76% COMPARISON:  Noncontrast head CT earlier today. Carotid Doppler ultrasound 02/02/2013. FINDINGS: CTA NECK FINDINGS Aortic arch: Standard 3 vessel aortic arch with moderate atherosclerotic plaque. The brachiocephalic artery is widely patent. Extensive calcified plaque results in moderate proximal right subclavian artery stenosis. Heavily calcified plaque in the left subclavian artery beginning just beyond its origin completely obscures the vessel, which may be occluded through the level of the left vertebral artery origin. The left subclavian artery is patent more distally. Right carotid system: Patent with scattered nonstenotic plaque throughout the common carotid artery. Calcified plaque about the carotid bifurcation results in 55% ICA origin stenosis. Left carotid system: Patent with motion artifact limiting assessment of the common carotid artery origin. Scattered mild nonstenotic plaque throughout the common carotid artery with more extensive,  heavily calcified plaque about the carotid bifurcation resulting in 50% proximal ICA stenosis. Tortuous distal cervical ICA. Vertebral arteries: The right vertebral artery is patent with scattered nonstenotic plaque. Above described heavily calcified plaque in the left subclavian artery extends into the proximal left vertebral artery, which is occluded. There is reconstitution of the right V2 and V3 segments which also demonstrate moderate atherosclerosis. Skeleton: Moderate C1-2  arthropathy. Moderate focal disc degeneration at C5-6. Other neck: No mass or enlarged lymph nodes. Upper chest: Small calcified granulomas in the left upper lobe of the lung. Review of the MIP images confirms the above findings CTA HEAD FINDINGS Anterior circulation: The internal carotid arteries are patent from skull base to carotid termini with moderately extensive siphon atherosclerosis resulting in mild-to-moderate cavernous stenoses bilaterally. ACAs and MCAs are patent without evidence of proximal branch occlusion or significant A1 or M1 stenosis. Atherosclerotic branch vessel irregularity is noted including mild proximal right M2 stenoses. No aneurysm is identified. Posterior circulation: The intracranial vertebral arteries are patent with the right being dominant. Patent AICA and SCA origins are identified bilaterally. The basilar artery is widely patent. There is a diminutive left posterior communicating artery. The PCAs demonstrate moderate branch vessel atherosclerotic changes without significant P1 or proximal P2 stenosis. No aneurysm is identified. Venous sinuses: Patent. Anatomic variants: None. Delayed phase: Not performed. Review of the MIP images confirms the above findings CT Brain Perfusion Findings: CBF (<30%) Volume: 0 mL Perfusion (Tmax>6.0s) volume: 0 mL Mismatch Volume: n/a Infarction Location: None IMPRESSION: 1. No acute infarct evident on perfusion imaging. 2. Extensive calcified plaque throughout the proximal  left subclavian artery which is likely occluded (or possibly severely stenotic) with distal reconstitution. 3. 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 4. 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 5. 50% proximal left ICA stenosis. 6. Widely patent right vertebral artery. 7. Mild-to-moderate bilateral intracranial ICA stenoses. 8.  Aortic Atherosclerosis (ICD10-I70.0). Electronically Signed   By: Sebastian Ache M.D.   On: 08/22/2017 12:35   Ct Head Code Stroke Wo Contrast  Result Date: 08/22/2017 CLINICAL DATA:  Code stroke. Blurred vision and left-sided weakness. Symptoms began yesterday. EXAM: CT HEAD WITHOUT CONTRAST TECHNIQUE: Contiguous axial images were obtained from the base of the skull through the vertex without intravenous contrast. COMPARISON:  None. FINDINGS: Brain: No sign of acute infarction. Generalized atrophy. Chronic small-vessel ischemic changes of the cerebral hemispheric white matter. No mass lesion, hemorrhage, hydrocephalus or extra-axial collection. Vascular: There is atherosclerotic calcification of the major vessels at the base of the brain. Skull: Negative Sinuses/Orbits: Some mucosal thickening. No advanced finding. Orbits negative. Other: None ASPECTS (Alberta Stroke Program Early CT Score) - Ganglionic level infarction (caudate, lentiform nuclei, internal capsule, insula, M1-M3 cortex): 7 - Supraganglionic infarction (M4-M6 cortex): 3 Total score (0-10 with 10 being normal): 10 IMPRESSION: 1. No acute finding. Chronic small-vessel ischemic changes of the hemispheric white matter. 2. ASPECTS is 10. 3. These results were called by telephone at the time of interpretation on 08/22/2017 at 11:16 am to Dr. Nita Sickle , who verbally acknowledged these results. Electronically Signed   By: Paulina Fusi M.D.   On: 08/22/2017 11:19    EKG:   Orders placed or performed during the hospital encounter of 08/22/17  . ED EKG  . ED EKG    IMPRESSION AND PLAN:    Tommy Knight  is a 72 y.o. male with a known history of COPD continues to smoke hyperlipidemia, hypertension and allergic rhinitis is presenting to the ED with a chief complaint of left upper and lower extremity weakness associated with some tingling and numbness.  Patient symptoms were started yesterday evening but he did not come to the hospital patient waited until today morning and when the symptoms are getting worse he came into the ED.  Initial CT scan is negative.  #Acute CVA/TIA Admit to MedSurg unit CT angiogram of  the head is negative We will get complete stroke work-up with MRI, carotid Dopplers and 2D echocardiogram Neurology consult placed Neurovascular checks Patient has passed bedside swallow evaluation Check TSH, fasting lipid panel and hemoglobin A1c PT, OT evaluation allow permissive hypertension while ruling out stroke Patient was on aspirin 81 mg enteric-coated at home will increase it to aspirin 325 mg  #Chronic history of COPD no exacerbation at this time Patient was counseled to quit smoking Provide albuterol inhalers as needed  #Essential hypertension Allow permissive hypertension and holding his home medication benazepril    #hyperkalemia Give Kayexalate and hold benazepril  #Hyperlipidemia check fasting lipid panel and patient will be on statin  #Tobacco abuse disorder Counseled patient to quit smoking for 45 minutes.  He verbalized understanding but refusing nicotine patch at this time  #Alcohol abuse disorder CIWA, outpatient alcohol Anonymous    All the records are reviewed and case discussed with ED provider. Management plans discussed with the patient, family and they are in agreement.  CODE STATUS: fc   TOTAL TIME TAKING CARE OF THIS PATIENT: 42 minutes.   Note: This dictation was prepared with Dragon dictation along with smaller phrase technology. Any transcriptional errors that result from this process are unintentional.  Ramonita Lab  M.D on 08/22/2017 at 1:46 PM  Between 7am to 6pm - Pager - [phone]  After 6pm go to [email or web address] - password [email or web address] [facility]  [place] [facility]  Office  [phone]  CC: Primary care physician; Erasmo Downer, NP

## 2017-08-22 NOTE — Progress Notes (Signed)
Family Meeting Note  Advance Directive:yes  Today a meeting took place with the Patient, WIFE at bedside    The following clinical team members were present during this meeting:MD  The following were discussed:Patient's diagnosis: tia/cva, hyperkalemia, tobacco abuse disorder, other comorbidities including COPD, hypertension, hyperlipidemia, alcohol abuse, treatment plan of care discussed in detail with the patient.  He verbalized understanding of the plan.  Wife at bedside.  Agreeable with the current plan., Patient's progosis: Unable to determine and Goals for treatment: Full Code, wife HCPOA  Additional follow-up to be provided: Hospitalist and neurologist  Time spent during discussion:17 MIN  Tommy LabAruna Brelee Renk, MD

## 2017-08-22 NOTE — Consult Note (Signed)
Referring Physician: Derrill Kay    Chief Complaint: Left facial droop  HPI: Tommy Knight is a RH 72 y.o. male who reports that on yesterday he awakened at baseline.  Went to work in his yard.  Stayed out all day.  Was last seen prior to his son leaving and was fine at that time.  When he entered the house at 6p was noted by his wife to not be walking well and had a left facial droop.  Was able to eat dinner.  Prior to going to bed noted that he had difficulty holding things in his left hand.  Refused to come to the ED.  Today was noted to still behaving difficulty and was finally encouraged by his family to come for evaluation.  Initial NIHSS of 4.    Date last known well: Date: 08/21/2017 Time last known well: Time: 16:00 tPA Given: No: Outside time window  Past Medical History:  Diagnosis Date  . Allergic rhinitis   . Asthma   . COPD (chronic obstructive pulmonary disease) (HCC)   . Hyperlipidemia   . Hypertension     Past Surgical History:  Procedure Laterality Date  . APPENDECTOMY      Family history: Father deceased with cancer.  Mother deceased with complications of DM.  Social History:  reports that he has been smoking.  He has never used smokeless tobacco. He reports that he drinks alcohol. His drug history is not on file.  Allergies: No Known Allergies  Medications: I have reviewed the patient's current medications. Prior to Admission:  Prior to Admission medications   Medication Sig Start Date End Date Taking? Authorizing Provider  albuterol (PROVENTIL HFA;VENTOLIN HFA) 108 (90 Base) MCG/ACT inhaler Inhale 2 puffs into the lungs every 4 (four) hours as needed for wheezing or shortness of breath.    Yes [provider]  aspirin EC 81 MG tablet Take 1 tablet by mouth at bedtime.    Yes [provider]  benazepril (LOTENSIN) 40 MG tablet Take 40 mg by mouth at bedtime.    Yes [provider]  Fluticasone-Salmeterol (ADVAIR) 250-50 MCG/DOSE AEPB  Inhale 1 puff into the lungs 2 (two) times daily.   Yes [provider]  loratadine (CLARITIN) 10 MG tablet Take 10 mg by mouth at bedtime.    Yes [provider]  pantoprazole (PROTONIX) 40 MG tablet Take 1 tablet by mouth 2 (two) times daily. 05/18/17  Yes [provider]  pravastatin (PRAVACHOL) 80 MG tablet Take 80 mg by mouth at bedtime.    Yes [provider]  sucralfate (CARAFATE) 1 g tablet Take 1 g by mouth 2 (two) times daily.    Yes [provider]  terazosin (HYTRIN) 5 MG capsule Take 5 mg by mouth at bedtime.   Yes [provider]  dicyclomine (BENTYL) 20 MG tablet Take 1 tablet (20 mg total) by mouth 3 (three) times daily as needed (abdominal pain). Patient not taking: Reported on 08/22/2017 06/18/17   Phineas Semen, MD     ROS: History obtained from the patient  General ROS: negative for - chills, fatigue, fever, night sweats, weight gain or weight loss Psychological ROS: negative for - behavioral disorder, hallucinations, memory difficulties, mood swings or suicidal ideation Ophthalmic ROS: negative for - blurry vision, double vision, eye pain or loss of vision ENT ROS: negative for - epistaxis, nasal discharge, oral lesions, sore throat, tinnitus or vertigo Allergy and Immunology ROS: negative for - hives or itchy/watery eyes  Hematological and Lymphatic ROS: negative for - bleeding problems, bruising or swollen lymph nodes Endocrine ROS: negative for - galactorrhea, hair pattern changes, polydipsia/polyuria or temperature intolerance Respiratory ROS: negative for - cough, hemoptysis, shortness of breath or wheezing Cardiovascular ROS: chest pain Gastrointestinal ROS: negative for - abdominal pain, diarrhea, hematemesis, nausea/vomiting or stool incontinence Genito-Urinary ROS: negative for - dysuria, hematuria, incontinence or urinary frequency/urgency Musculoskeletal ROS: negative for - joint swelling or muscular  weakness Neurological ROS: as noted in HPI Dermatological ROS: negative for rash and skin lesion changes  Physical Examination: Blood pressure 112/76, pulse 67, temperature 98.7 F (37.1 C), temperature source Oral, resp. rate 20, height 5\' 11"  (1.803 m), weight 80.7 kg (178 lb), SpO2 96 %.  HEENT-  Normocephalic, no lesions, without obvious abnormality.  Normal external eye and conjunctiva.  Normal TM's bilaterally.  Normal auditory canals and external ears. Normal external nose, mucus membranes and septum.  Normal pharynx. Cardiovascular- S1, S2 normal, pulses palpable throughout   Lungs- chest clear, no wheezing, rales, normal symmetric air entry Abdomen- soft, non-tender; bowel sounds normal; no masses,  no organomegaly Extremities- no edema Lymph-no adenopathy palpable Musculoskeletal-no joint tenderness, deformity or swelling Skin-warm and dry, no hyperpigmentation, vitiligo, or suspicious lesions  Neurological Examination   Mental Status: Alert, oriented, thought content appropriate.  Speech fluent without evidence of aphasia.  Able to follow 3 step commands without difficulty. Cranial Nerves: II: Discs flat bilaterally; Visual fields grossly normal, pupils equal, round, reactive to light and accommodation III,IV, VI: ptosis not present, extra-ocular motions intact bilaterally V,VII: left facial droop, facial light touch sensation decreased on th left VIII: hearing normal bilaterally IX,X: gag reflex present XI: bilateral shoulder shrug XII: midline tongue extension Motor: Right : Upper extremity   5/5    Left:     Upper extremity   5-/5  Lower extremity   5/5     Lower extremity   4/5 Tone and bulk:normal tone throughout; no atrophy noted Sensory: Pinprick and light touch decreased on the left Deep Tendon Reflexes: 2+ and symmetric throughout Plantars: Right: mute   Left: upgoing Cerebellar: Normal finger-to-nose and normal heel-to-shin testing bilaterally Gait: not  tested due to safety concerns  Laboratory Studies:  Basic Metabolic Panel: Recent Labs  Lab 08/22/17 1124  NA 135  K 5.3*  CL 101  CO2 25  GLUCOSE 112*  BUN 21*  CREATININE 1.19  CALCIUM 9.0    Liver Function Tests: Recent Labs  Lab 08/22/17 1124  AST 25  ALT 23  ALKPHOS 68  BILITOT 1.0  PROT 7.5  ALBUMIN 4.2   No results for input(s): LIPASE, AMYLASE in the last 168 hours. No results for input(s): AMMONIA in the last 168 hours.  CBC: Recent Labs  Lab 08/22/17 1124  WBC 7.4  NEUTROABS 4.8  HGB 15.4  HCT 45.1  MCV 87.3  PLT 314    Cardiac Enzymes: Recent Labs  Lab 08/22/17 1124  TROPONINI <0.03    BNP: Invalid input(s): POCBNP  CBG: Recent Labs  Lab 08/22/17 1122  GLUCAP 109*    Microbiology: No results found for this or any previous visit.  Coagulation Studies: Recent Labs    08/22/17 1124  LABPROT 12.3  INR 0.92    Urinalysis: No results for input(s): COLORURINE, LABSPEC, PHURINE, GLUCOSEU, HGBUR, BILIRUBINUR, KETONESUR, PROTEINUR, UROBILINOGEN, NITRITE, LEUKOCYTESUR in the last 168 hours.  Invalid input(s): APPERANCEUR  Lipid Panel: No results found for: CHOL, TRIG, HDL, CHOLHDL, VLDL, LDLCALC  HgbA1C: No results  found for: HGBA1C  Urine Drug Screen:  No results found for: LABOPIA, COCAINSCRNUR, LABBENZ, AMPHETMU, THCU, LABBARB  Alcohol Level: No results for input(s): ETH in the last 168 hours.  Other results: EKG: normal sinus rhythm at 67 bpm.  Imaging: Ct Head Code Stroke Wo Contrast  Result Date: 08/22/2017 CLINICAL DATA:  Code stroke. Blurred vision and left-sided weakness. Symptoms began yesterday. EXAM: CT HEAD WITHOUT CONTRAST TECHNIQUE: Contiguous axial images were obtained from the base of the skull through the vertex without intravenous contrast. COMPARISON:  None. FINDINGS: Brain: No sign of acute infarction. Generalized atrophy. Chronic small-vessel ischemic changes of the cerebral hemispheric white matter. No  mass lesion, hemorrhage, hydrocephalus or extra-axial collection. Vascular: There is atherosclerotic calcification of the major vessels at the base of the brain. Skull: Negative Sinuses/Orbits: Some mucosal thickening. No advanced finding. Orbits negative. Other: None ASPECTS (Alberta Stroke Program Early CT Score) - Ganglionic level infarction (caudate, lentiform nuclei, internal capsule, insula, M1-M3 cortex): 7 - Supraganglionic infarction (M4-M6 cortex): 3 Total score (0-10 with 10 being normal): 10 IMPRESSION: 1. No acute finding. Chronic small-vessel ischemic changes of the hemispheric white matter. 2. ASPECTS is 10. 3. These results were called by telephone at the time of interpretation on 08/22/2017 at 11:16 am to Dr. Nita SickleAROLINA VERONESE , who verbally acknowledged these results. Electronically Signed   By: Paulina FusiMark  Shogry M.D.   On: 08/22/2017 11:19    Assessment: 72 y.o. male presenting with a left hemiparesis.  Patient is outside the time window for tPA and nearing the window for intervention as well.  On ASA at home.  Head CT reviewed and shows no acute changes.  Further work up recommended.    Stroke Risk Factors - hyperlipidemia, hypertension and smoking  Plan: 1. HgbA1c, fasting lipid panel 2. CTA of head and neck, CTP 3. If above imaging shows no intervention appropriate patient to have MRI of the brain without contrast 3. PT consult, OT consult, Speech consult 4. Echocardiogram 5. Prophylactic therapy-Antiplatelet med: Aspirin - dose 325mg  daily 6. NPO until RN stroke swallow screen 7. Telemetry monitoring 8. Frequent neuro checks 9. Smoking cessation counseling   Case discussed with Dr. Clotilde DieterGoodman   Reina Wilton, MD Neurology [phone] 08/22/2017, 12:10 PM  Addendum: CTA and CTP reviewed.  No evidence of LVO or infarct area.  Further stroke work up recommended as above.    Thana FarrLeslie Akhila Mahnken, MD Neurology [phone]

## 2017-08-22 NOTE — ED Provider Notes (Signed)
[facility] Emergency Department Provider Note   ____________________________________________   I have reviewed the triage vital signs and the nursing notes.   HISTORY  Chief Complaint Code Stroke   History limited by: Not Limited   HPI Tommy Knight is a 72 y.o. male who presents to the emergency department today because of concerns for weakness.  The weakness is on the left face left arm and left leg.  Symptoms started yesterday.  He was sitting down in his shop when they started.  He denies hitting his head or any unusual activity prior in the day.  He feels like his symptoms have somewhat improved since he started.  He states he was having a very hard time speaking when they first started.  Patient denies similar symptoms in the past.  He denies any recent illness.  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  He was told this about 10 years ago.  Patient was called a code stroke and neurology evaluated the patient prior to my evaluation.   Per medical record review patient has a history of COPD, HLD, HTN.  Past Medical History:  Diagnosis Date  . Allergic rhinitis   . Asthma   . COPD (chronic obstructive pulmonary disease) (HCC)   . Hyperlipidemia   . Hypertension     There are no active problems to display for this patient.   Past Surgical History:  Procedure Laterality Date  . APPENDECTOMY      Prior to Admission medications   Medication Sig Start Date End Date Taking? Authorizing Provider  albuterol (PROVENTIL HFA;VENTOLIN HFA) 108 (90 Base) MCG/ACT inhaler Inhale 2 puffs into the lungs every 4 (four) hours as needed for wheezing or shortness of breath.    Yes [provider]  aspirin EC 81 MG tablet Take 1 tablet by mouth at bedtime.    Yes [provider]  benazepril (LOTENSIN) 40 MG tablet Take 40 mg by mouth at bedtime.    Yes [provider]   Fluticasone-Salmeterol (ADVAIR) 250-50 MCG/DOSE AEPB Inhale 1 puff into the lungs 2 (two) times daily.   Yes [provider]  loratadine (CLARITIN) 10 MG tablet Take 10 mg by mouth at bedtime.    Yes [provider]  pantoprazole (PROTONIX) 40 MG tablet Take 1 tablet by mouth 2 (two) times daily. 05/18/17  Yes [provider]  pravastatin (PRAVACHOL) 80 MG tablet Take 80 mg by mouth at bedtime.    Yes [provider]  sucralfate (CARAFATE) 1 g tablet Take 1 g by mouth 2 (two) times daily.    Yes [provider]  terazosin (HYTRIN) 5 MG capsule Take 5 mg by mouth at bedtime.   Yes [provider]  dicyclomine (BENTYL) 20 MG tablet Take 1 tablet (20 mg total) by mouth 3 (three) times daily as needed (abdominal pain). Patient not taking: Reported on 08/22/2017 06/18/17   Phineas Semen, MD    Allergies Patient has no known allergies.  No family history on file.  Social History Social History   Tobacco Use  . Smoking status: Current Every Day Smoker  . Smokeless tobacco: Never Used  Substance Use Topics  . Alcohol use: Yes    Comment: few beers   . Drug use: Not on file    Review of Systems Constitutional: No fever/chills Eyes: No visual changes. ENT: No sore throat. Cardiovascular: Denies chest  pain. Respiratory: Denies shortness of breath. Gastrointestinal: No abdominal pain.  No nausea, no vomiting.  No diarrhea.   Genitourinary: Negative for dysuria. Musculoskeletal: Negative for back pain. Skin: Negative for rash. Neurological: Positive for left facial numbness, left extremity numbness.   ____________________________________________   PHYSICAL EXAM:  VITAL SIGNS: ED Triage Vitals  Enc Vitals Group     BP 08/22/17 1058 112/76     Pulse Rate 08/22/17 1058 67     Resp 08/22/17 1058 20     Temp 08/22/17 1058 98.7 F (37.1 C)     Temp Source 08/22/17 1058 Oral     SpO2 08/22/17 1058 96 %     Weight 08/22/17 1100  178 lb (80.7 kg)     Height 08/22/17 1100 5\' 11"  (1.803 m)     Head Circumference --      Peak Flow --      Pain Score 08/22/17 1100 0    Constitutional: Alert and oriented.  Eyes: Conjunctivae are normal.  ENT      Head: Normocephalic and atraumatic.      Nose: No congestion/rhinnorhea.      Mouth/Throat: Mucous membranes are moist.      Neck: No stridor. Hematological/Lymphatic/Immunilogical: No cervical lymphadenopathy. Cardiovascular: Normal rate, regular rhythm.  No murmurs, rubs, or gallops.  Respiratory: Normal respiratory effort without tachypnea nor retractions. Breath sounds are clear and equal bilaterally. No wheezes/rales/rhonchi. Gastrointestinal: Soft and non tender. No rebound. No guarding.  Genitourinary: Deferred Musculoskeletal: Normal range of motion in all extremities. No lower extremity edema. Neurologic: Slurred speech. Moving all extremities.  Skin:  Skin is warm, dry and intact. No rash noted. Psychiatric: Mood and affect are normal. Speech and behavior are normal. Patient exhibits appropriate insight and judgment.  ____________________________________________    LABS (pertinent positives/negatives)  Inr 0.92 CBC wnl CMP na 135, k 5.3, glu 112, cr 1.19 Trop <0.03 ____________________________________________   EKG  I, Phineas SemenGraydon Sandor Arboleda, attending physician, personally viewed and interpreted this EKG  EKG Time: 1054 Rate: 67 Rhythm: normal sinus rhythm Axis: normal Intervals: qtc 405 QRS: narrow ST changes: no st elevation Impression: normal ekg   ____________________________________________    RADIOLOGY  CT head No acute findings  ____________________________________________   PROCEDURES  Procedures  ____________________________________________   INITIAL IMPRESSION / ASSESSMENT AND PLAN / ED COURSE  Pertinent labs & imaging results that were available during my care of the patient were reviewed by me and considered in my  medical decision making (see chart for details).   Presents to the emergency department today with left-sided deficits.  He was called a code stroke.  Dr. Thad Rangereynolds with neurology did evaluate.  He was outside the window for TPA.  CT angios and perfusion studies were performed without evidence of large vessel occlusion.  Patient will be admitted to the hospital service.   ____________________________________________   FINAL CLINICAL IMPRESSION(S) / ED DIAGNOSES  Final diagnoses:  Stroke-like symptoms     Note: This dictation was prepared with Dragon dictation. Any transcriptional errors that result from this process are unintentional     Phineas SemenGoodman, Kayin Osment, MD 08/22/17 1255

## 2017-08-22 NOTE — ED Notes (Signed)
Family at bedside. 

## 2017-08-22 NOTE — ED Notes (Signed)
Called code stroke to [phone]

## 2017-08-23 ENCOUNTER — Observation Stay (HOSPITAL_BASED_OUTPATIENT_CLINIC_OR_DEPARTMENT_OTHER)
Admit: 2017-08-23 | Discharge: 2017-08-23 | Disposition: A | Discharge status: Home / Self Care | Payer: Medicare HMO | Attending: Neurology | Admitting: Neurology

## 2017-08-23 ENCOUNTER — Observation Stay: Payer: Medicare HMO

## 2017-08-23 DIAGNOSIS — I340 Nonrheumatic mitral (valve) insufficiency: Secondary | ICD-10-CM

## 2017-08-23 DIAGNOSIS — I351 Nonrheumatic aortic (valve) insufficiency: Secondary | ICD-10-CM | POA: Diagnosis not present

## 2017-08-23 DIAGNOSIS — I639 Cerebral infarction, unspecified: Secondary | ICD-10-CM | POA: Diagnosis not present

## 2017-08-23 LAB — CBC
HCT: 39.9 % — ABNORMAL LOW (ref 40.0–52.0)
Hemoglobin: 14 g/dL (ref 13.0–18.0)
MCH: 30.5 pg (ref 26.0–34.0)
MCHC: 35 g/dL (ref 32.0–36.0)
MCV: 87.2 fL (ref 80.0–100.0)
PLATELETS: 296 10*3/uL (ref 150–440)
RBC: 4.58 MIL/uL (ref 4.40–5.90)
RDW: 12.9 % (ref 11.5–14.5)
WBC: 7.1 10*3/uL (ref 3.8–10.6)

## 2017-08-23 LAB — HEMOGLOBIN A1C
HEMOGLOBIN A1C: 5.6 % (ref 4.8–5.6)
Mean Plasma Glucose: 114.02 mg/dL

## 2017-08-23 LAB — LIPID PANEL
CHOL/HDL RATIO: 3.6 ratio
Cholesterol: 131 mg/dL (ref 0–200)
HDL: 36 mg/dL — AB (ref >40)
LDL Cholesterol: 80 mg/dL (ref 0–99)
Triglycerides: 75 mg/dL (ref <150)
VLDL: 15 mg/dL (ref 0–40)

## 2017-08-23 LAB — TSH: TSH: 2.943 u[IU]/mL (ref 0.350–4.500)

## 2017-08-23 MED ORDER — CLOPIDOGREL BISULFATE 75 MG PO TABS
75.0000 mg | ORAL_TABLET | Freq: Every day | ORAL | Status: DC
  Administered 2017-08-23: 12:00:00 75 mg via ORAL
  Filled 2017-08-23: qty 1

## 2017-08-23 MED ORDER — ASPIRIN EC 81 MG PO TBEC: 81.0000 mg | DELAYED_RELEASE_TABLET | Freq: Every day | ORAL | Status: DC

## 2017-08-23 MED ORDER — CLOPIDOGREL BISULFATE 75 MG PO TABS: 75.0000 mg | ORAL_TABLET | Freq: Every day | ORAL | 2 refills | Status: DC

## 2017-08-23 NOTE — Progress Notes (Signed)
*  PRELIMINARY RESULTS* Echocardiogram 2D Echocardiogram has been performed.  Tommy GulaJoan M Odin Mariani 08/23/2017, 11:53 AM

## 2017-08-23 NOTE — Discharge Instructions (Signed)
-  Advised smoking cessation

## 2017-08-23 NOTE — Progress Notes (Signed)
PT Plan of Care/CERT  [facility]  Physical Therapy Certification  Patient Details  Name: Tommy LawmanJerry A Gilden MRN: [id number] Date of Birth: July 12, 1945 Medical Diagnosis: Active Problems:   CVA (cerebral vascular accident) [facility](HCC)  Visit Diagnosis: PT Visit Diagnosis: Difficulty in walking, not elsewhere classified (R26.2), Hemiplegia and hemiparesis Hemiplegia - Right/Left: Left Hemiplegia - dominant/non-dominant: Non-dominant Hemiplegia - caused by: Cerebral infarction PT Problem: Decreased strength, Decreased activity tolerance, Decreased balance, Decreased mobility, Decreased coordination, Decreased cognition, Decreased knowledge of use of DME, Decreased safety awareness, Decreased knowledge of precautions, Impaired sensation  Goals: Pt will go Supine/Side to Sit: Independently(for pulmonary hygiene and self-repositioning) Pt will Transfer Bed to Chair/Chair to Bed: with modified independence(LRAD, for access to seating surfaces in home environment) Pt will Ambulate: > 125 feet, with modified independence, with least restrictive assistive device(for household mobilization upon discharge) Pt will Go Up / Down Stairs: 3-5 stairs, with supervision, with least restrictive assistive device, with rail(s)(for entry/exit of home upon discharge)  Duration: Services will be provided through the following date: 09/06/17  Frequency: 7X/week  Amount: one treatment session per day unless otherwise indicated.    Certification Start Date: 08/23/2017 Certification End Date: 09/06/17   PT Treatments/Interventions: DME instruction, Gait training, Stair training, Functional mobility training, Therapeutic activities, Therapeutic exercise, Balance training, Neuromuscular re-education, Patient/family education  Arlis Yale H. Manson PasseyBrown, PT, DPT, NCS 08/23/17, 3:00 PM [phone]

## 2017-08-23 NOTE — Progress Notes (Signed)
Discussed discharge instructions and medications with patient. IV removed. All questions addressed. Patient to be transported home via car by his wife.  Orson Apeanielle Amaranta Mehl, RN

## 2017-08-23 NOTE — Evaluation (Signed)
Physical Therapy Evaluation Patient Details Name: Tommy LawmanJerry A Knight MRN: [id number] DOB: [birth year] Today's Date: 08/23/2017   History of Present Illness  presented to ER secondary to L UE/LE weakness, facial droop; admitted with acute TIA/CVA.  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  Clinical Impression  Upon evaluation, patient alert and oriented; follows commands.  Eager for OOB mobility efforts and discharge when medically appropriate.  Mild L UE > LE coordination and fine motor control deficits noted (increased time/effort required to complete isolated tasks), strength grossly 4/5 throughout.  Denies sensory deficits.  Able to complete bed mobility with mod indep; sit/stand, basic transfers and gait (350') with SPC, cga/close sup; stairs (up/down 4 with single rail), cga/close sup.  Reciprocal stepping pattern with fair/good L LE stance time, control and overall stability.  Maintains static stance, romberg x18-20 seconds with minimal sway, no LOB; fair/good awareness of higher level deficits and fair/good use of appropriate safety/compensatory strategies as needed. Would benefit from skilled PT to address above deficits and promote optimal return to PLOF; recommend transition to home with outpatient follow up as appropriate.    Follow Up Recommendations Outpatient PT    Equipment Recommendations  (has SPC)    Recommendations for Other Services       Precautions / Restrictions Precautions Precautions: Fall Restrictions Weight Bearing Restrictions: No      Mobility  Bed Mobility Overal bed mobility: Modified Independent                Transfers Overall transfer level: Needs assistance Equipment used: None Transfers: Sit to/from Stand Sit to Stand: Supervision         General transfer comment: fair/good LE strength and control  Ambulation/Gait Ambulation/Gait assistance: Min guard;Supervision Ambulation Distance (Feet): 350  Feet Assistive device: Straight cane   Gait velocity: 10' walk time, 6 seconds   General Gait Details: reciprocal stepping pattern with good weight shift, good step height/length bilat.  Steady, consistent cadence without buckling or LOB.  Able to complete head turns, changes of direction without difficulty.  Stairs Stairs: Yes Stairs assistance: Supervision;Min guard Stair Management: One rail Left Number of Stairs: 6 General stair comments: reciprocal stepping pattern, good LE strength and control; no buckling or LOB  Wheelchair Mobility    Modified Rankin (Stroke Patients Only)       Balance Overall balance assessment: Needs assistance Sitting-balance support: No upper extremity supported;Feet supported Sitting balance-Leahy Scale: Good     Standing balance support: No upper extremity supported Standing balance-Leahy Scale: Fair           Rhomberg - Eyes Opened: 20 Rhomberg - Eyes Closed: 18                 Pertinent Vitals/Pain Pain Assessment: No/denies pain    Home Living Family/patient expects to be discharged to:: Private residence Living Arrangements: Spouse/significant other Available Help at Discharge: Family Type of Home: House Home Access: Stairs to enter Entrance Stairs-Rails: Doctor, general practiceight;Left Entrance Stairs-Number of Steps: 5 Home Layout: One level Home Equipment: Cane - single point      Prior Function Level of Independence: Independent with assistive device(s)         Comments: Mod indep with SPC for ADLs, household and community mobility; 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  Denies fall history.     Hand Dominance        Extremity/Trunk Assessment   Upper Extremity Assessment Upper Extremity Assessment: (L UE grossly 4+/5 throughout,  mild decrease in coordination (mild ataxia, dysmetria) and fine motor control, denies sensory deficit; R UE grossly WFL)    Lower Extremity Assessment Lower Extremity Assessment: (L  LE grossly 4/5, mild delay in activation and coordination, denies sensory deficit; R LE grossly WFL)       Communication   Communication: No difficulties  Cognition Arousal/Alertness: Awake/alert Behavior During Therapy: WFL for tasks assessed/performed Overall Cognitive Status: Within Functional Limits for tasks assessed                                        General Comments      Exercises Other Exercises Other Exercises: Upper and lower body dressing, set up/sup-increased time for L UE/LE coordination and fine motor control   Assessment/Plan    PT Assessment Patient needs continued PT services  PT Problem List Decreased strength;Decreased activity tolerance;Decreased balance;Decreased mobility;Decreased coordination;Decreased cognition;Decreased knowledge of use of DME;Decreased safety awareness;Decreased knowledge of precautions;Impaired sensation       PT Treatment Interventions DME instruction;Gait training;Stair training;Functional mobility training;Therapeutic activities;Therapeutic exercise;Balance training;Neuromuscular re-education;Patient/family education    PT Goals (Current goals can be found in the Care Plan section)  Acute Rehab PT Goals Patient Stated Goal: to go home today PT Goal Formulation: With patient/family Time For Goal Achievement: 09/06/17 Potential to Achieve Goals: Good    Frequency 7X/week   Barriers to discharge        Co-evaluation               AM-PAC PT "6 Clicks" Daily Activity  Outcome Measure Difficulty turning over in bed (including adjusting bedclothes, sheets and blankets)?: None Difficulty moving from lying on back to sitting on the side of the bed? : None Difficulty sitting down on and standing up from a chair with arms (e.g., wheelchair, bedside commode, etc,.)?: None Help needed moving to and from a bed to chair (including a wheelchair)?: A Little Help needed walking in hospital room?: A Little Help  needed climbing 3-5 steps with a railing? : A Little 6 Click Score: 21    End of Session Equipment Utilized During Treatment: Gait belt Activity Tolerance: Patient tolerated treatment well Patient left: in chair;with call bell/phone within reach;with family/visitor present(patient refused chair alarm; RN informed/aware) Nurse Communication: Mobility status PT Visit Diagnosis: Difficulty in walking, not elsewhere classified (R26.2);Hemiplegia and hemiparesis Hemiplegia - Right/Left: Left Hemiplegia - dominant/non-dominant: Non-dominant Hemiplegia - caused by: Cerebral infarction    Time: 0941-1020 PT Time Calculation (min) (ACUTE ONLY): 39 min   Charges:   PT Evaluation $PT Eval Moderate Complexity: 1 Mod PT Treatments $Gait Training: 8-22 mins $Therapeutic Activity: 8-22 mins   PT G Codes:        Evaluation/treatment session was lead and completed by Iris Pert, SPT; directly observed, guided and documented by Cephus Slater, PT.  Tommy Rainwater. Manson Passey, PT, DPT, NCS 08/23/17, 10:57 AM [phone]

## 2017-08-23 NOTE — Progress Notes (Signed)
Subjective: Patient reports that he is ambulating better today.  No new neurological complaints.    Objective: Current vital signs: BP (!) 164/73   Pulse 62   Temp 98.2 F (36.8 C) (Oral)   Resp 20   Ht 5\' 11"  (1.803 m)   Wt 80.7 kg (178 lb)   SpO2 97%   BMI 24.83 kg/m  Vital signs in last 24 hours: Temp:  [98.1 F (36.7 C)-98.7 F (37.1 C)] 98.2 F (36.8 C) (06/11 1610) Pulse Rate:  [55-79] 62 (06/11 0937) Resp:  [12-21] 20 (06/11 0555) BP: (81-164)/(52-96) 164/73 (06/11 0937) SpO2:  [94 %-100 %] 97 % (06/11 0937) Weight:  [80.7 kg (178 lb)] 80.7 kg (178 lb) (06/10 1100)  Intake/Output from previous day: 06/10 0701 - 06/11 0700 In: -  Out: 300 [Urine:300] Intake/Output this shift: Total I/O In: 240 [P.O.:240] Out: -  Nutritional status:  Diet Order           Diet Heart Room service appropriate? Yes; Fluid consistency: Thin  Diet effective now          Neurologic Exam: Mental Status: Alert, oriented, thought content appropriate.  Speech fluent without evidence of aphasia.  Able to follow 3 step commands without difficulty. Cranial Nerves: II: Discs flat bilaterally; Visual fields grossly normal, pupils equal, round, reactive to light and accommodation III,IV, VI: ptosis not present, extra-ocular motions intact bilaterally V,VII: left facial droop, facial light touch sensation decreased on th left VIII: hearing normal bilaterally IX,X: gag reflex present XI: bilateral shoulder shrug XII: midline tongue extension Motor: Right :  Upper extremity   5/5                                      Left:     Upper extremity   5-/5             Lower extremity   5/5                                                  Lower extremity   4/5 Tone and bulk:normal tone throughout; no atrophy noted Sensory: Pinprick and light touch decreased on the left   Lab Results: Basic Metabolic Panel: Recent Labs  Lab 08/22/17 1124  NA 135  K 5.3*  CL 101  CO2 25  GLUCOSE 112*  BUN  21*  CREATININE 1.19  CALCIUM 9.0    Liver Function Tests: Recent Labs  Lab 08/22/17 1124  AST 25  ALT 23  ALKPHOS 68  BILITOT 1.0  PROT 7.5  ALBUMIN 4.2   No results for input(s): LIPASE, AMYLASE in the last 168 hours. No results for input(s): AMMONIA in the last 168 hours.  CBC: Recent Labs  Lab 08/22/17 1124 08/23/17 0438  WBC 7.4 7.1  NEUTROABS 4.8  --   HGB 15.4 14.0  HCT 45.1 39.9*  MCV 87.3 87.2  PLT 314 296    Cardiac Enzymes: Recent Labs  Lab 08/22/17 1124  TROPONINI <0.03    Lipid Panel: Recent Labs  Lab 08/23/17 0438  CHOL 131  TRIG 75  HDL 36*  CHOLHDL 3.6  VLDL 15  LDLCALC 80    CBG: Recent Labs  Lab 08/22/17 1122  GLUCAP 109*    Microbiology: No  results found for this or any previous visit.  Coagulation Studies: Recent Labs    08/22/17 1124  LABPROT 12.3  INR 0.92    Imaging: Ct Angio Head W Or Wo Contrast  Result Date: 08/22/2017 CLINICAL DATA:  Blurred vision and left-sided weakness. EXAM: CT ANGIOGRAPHY HEAD AND NECK CT PERFUSION BRAIN TECHNIQUE: Multidetector CT imaging of the head and neck was performed using the standard protocol during bolus administration of intravenous contrast. Multiplanar CT image reconstructions and MIPs were obtained to evaluate the vascular anatomy. Carotid stenosis measurements (when applicable) are obtained utilizing NASCET criteria, using the distal internal carotid diameter as the denominator. Multiphase CT imaging of the brain was performed following IV bolus contrast injection. Subsequent parametric perfusion maps were calculated using RAPID software. CONTRAST:  ISOVUE-370 IOPAMIDOL (ISOVUE-370) INJECTION 76% COMPARISON:  Noncontrast head CT earlier today. Carotid Doppler ultrasound 02/02/2013. FINDINGS: CTA NECK FINDINGS Aortic arch: Standard 3 vessel aortic arch with moderate atherosclerotic plaque. The brachiocephalic artery is widely patent. Extensive calcified plaque results in  moderate proximal right subclavian artery stenosis. Heavily calcified plaque in the left subclavian artery beginning just beyond its origin completely obscures the vessel, which may be occluded through the level of the left vertebral artery origin. The left subclavian artery is patent more distally. Right carotid system: Patent with scattered nonstenotic plaque throughout the common carotid artery. Calcified plaque about the carotid bifurcation results in 55% ICA origin stenosis. Left carotid system: Patent with motion artifact limiting assessment of the common carotid artery origin. Scattered mild nonstenotic plaque throughout the common carotid artery with more extensive, heavily calcified plaque about the carotid bifurcation resulting in 50% proximal ICA stenosis. Tortuous distal cervical ICA. Vertebral arteries: The right vertebral artery is patent with scattered nonstenotic plaque. Above described heavily calcified plaque in the left subclavian artery extends into the proximal left vertebral artery, which is occluded. There is reconstitution of the right V2 and V3 segments which also demonstrate moderate atherosclerosis. Skeleton: Moderate C1-2 arthropathy. Moderate focal disc degeneration at C5-6. Other neck: No mass or enlarged lymph nodes. Upper chest: Small calcified granulomas in the left upper lobe of the lung. Review of the MIP images confirms the above findings CTA HEAD FINDINGS Anterior circulation: The internal carotid arteries are patent from skull base to carotid termini with moderately extensive siphon atherosclerosis resulting in mild-to-moderate cavernous stenoses bilaterally. ACAs and MCAs are patent without evidence of proximal branch occlusion or significant A1 or M1 stenosis. Atherosclerotic branch vessel irregularity is noted including mild proximal right M2 stenoses. No aneurysm is identified. Posterior circulation: The intracranial vertebral arteries are patent with the right being  dominant. Patent AICA and SCA origins are identified bilaterally. The basilar artery is widely patent. There is a diminutive left posterior communicating artery. The PCAs demonstrate moderate branch vessel atherosclerotic changes without significant P1 or proximal P2 stenosis. No aneurysm is identified. Venous sinuses: Patent. Anatomic variants: None. Delayed phase: Not performed. Review of the MIP images confirms the above findings CT Brain Perfusion Findings: CBF (<30%) Volume: 0 mL Perfusion (Tmax>6.0s) volume: 0 mL Mismatch Volume: n/a Infarction Location: None IMPRESSION: 1. No acute infarct evident on perfusion imaging. 2. 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 3. 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 4. 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 5. 50% proximal left ICA stenosis. 6. Widely patent right vertebral artery. 7. Mild-to-moderate bilateral intracranial ICA stenoses. 8.  Aortic  Atherosclerosis (ICD10-I70.0). Electronically Signed   By: Sebastian AcheAllen  Grady M.D.   On: 08/22/2017 12:35   Ct Angio Neck W Or Wo Contrast  Result Date: 08/22/2017 CLINICAL DATA:  Blurred vision and left-sided weakness. EXAM: CT ANGIOGRAPHY HEAD AND NECK CT PERFUSION BRAIN TECHNIQUE: Multidetector CT imaging of the head and neck was performed using the standard protocol during bolus administration of intravenous contrast. Multiplanar CT image reconstructions and MIPs were obtained to evaluate the vascular anatomy. Carotid stenosis measurements (when applicable) are obtained utilizing NASCET criteria, using the distal internal carotid diameter as the denominator. Multiphase CT imaging of the brain was performed following IV bolus contrast injection. Subsequent parametric perfusion maps were calculated using RAPID software. CONTRAST:  100mL ISOVUE-370 IOPAMIDOL (ISOVUE-370) INJECTION 76%  COMPARISON:  Noncontrast head CT earlier today. Carotid Doppler ultrasound 02/02/2013. FINDINGS: CTA NECK FINDINGS Aortic arch: Standard 3 vessel aortic arch with moderate atherosclerotic plaque. The brachiocephalic artery is widely patent. Extensive calcified plaque results in moderate proximal right subclavian artery stenosis. Heavily calcified plaque in the left subclavian artery beginning just beyond its origin completely obscures the vessel, which may be occluded through the level of the left vertebral artery origin. The left subclavian artery is patent more distally. Right carotid system: Patent with scattered nonstenotic plaque throughout the common carotid artery. Calcified plaque about the carotid bifurcation results in 55% ICA origin stenosis. Left carotid system: Patent with motion artifact limiting assessment of the common carotid artery origin. Scattered mild nonstenotic plaque throughout the common carotid artery with more extensive, heavily calcified plaque about the carotid bifurcation resulting in 50% proximal ICA stenosis. Tortuous distal cervical ICA. Vertebral arteries: The right vertebral artery is patent with scattered nonstenotic plaque. Above described heavily calcified plaque in the left subclavian artery extends into the proximal left vertebral artery, which is occluded. There is reconstitution of the right V2 and V3 segments which also demonstrate moderate atherosclerosis. Skeleton: Moderate C1-2 arthropathy. Moderate focal disc degeneration at C5-6. Other neck: No mass or enlarged lymph nodes. Upper chest: Small calcified granulomas in the left upper lobe of the lung. Review of the MIP images confirms the above findings CTA HEAD FINDINGS Anterior circulation: The internal carotid arteries are patent from skull base to carotid termini with moderately extensive siphon atherosclerosis resulting in mild-to-moderate cavernous stenoses bilaterally. ACAs and MCAs are patent without evidence of  proximal branch occlusion or significant A1 or M1 stenosis. Atherosclerotic branch vessel irregularity is noted including mild proximal right M2 stenoses. No aneurysm is identified. Posterior circulation: The intracranial vertebral arteries are patent with the right being dominant. Patent AICA and SCA origins are identified bilaterally. The basilar artery is widely patent. There is a diminutive left posterior communicating artery. The PCAs demonstrate moderate branch vessel atherosclerotic changes without significant P1 or proximal P2 stenosis. No aneurysm is identified. Venous sinuses: Patent. Anatomic variants: None. Delayed phase: Not performed. Review of the MIP images confirms the above findings CT Brain Perfusion Findings: CBF (<30%) Volume: 0 mL Perfusion (Tmax>6.0s) volume: 0 mL Mismatch Volume: n/a Infarction Location: None IMPRESSION: 1. No acute infarct evident on perfusion imaging. 2. 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 3. 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 4. 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 5. 50% proximal left ICA stenosis. 6. Widely patent right vertebral artery. 7. Mild-to-moderate bilateral intracranial ICA stenoses. 8.  Aortic Atherosclerosis (ICD10-I70.0). Electronically Signed   By: Sebastian AcheAllen  Grady  M.D.   On: 08/22/2017 12:35   Mr Brain Wo Contrast  Result Date: 08/23/2017 CLINICAL DATA:  Initial evaluation for acute left-sided weakness with numbness and tingling. EXAM: MRI HEAD WITHOUT CONTRAST TECHNIQUE: Multiplanar, multiecho pulse sequences of the brain and surrounding structures were obtained without intravenous contrast. COMPARISON:  Prior CTA from 08/22/2017 FINDINGS: Brain: Generalized age-related cerebral atrophy with moderate chronic small vessel ischemic disease. 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 No associated mass effect. No other evidence for acute or subacute ischemia. No other is of remote cortical infarction. No acute or chronic intracranial hemorrhage. No mass lesion, midline shift or mass effect. No hydrocephalus. No extra-axial fluid collection. Pituitary gland within normal limits. Vascular: Major intracranial vascular flow voids maintained. Skull and upper cervical spine: Craniocervical junction within normal limits. Bone marrow signal intensity normal. No scalp soft tissue abnormality. Sinuses/Orbits: Globes and orbital soft tissues within normal limits. Scattered mucosal thickening throughout the paranasal sinuses. Small left mastoid effusion, likely benign/sterile. Inner ear structures normal. Other: None. IMPRESSION: 1. 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 2. Moderate chronic microvascular ischemic disease. Electronically Signed   By: Rise Mu M.D.   On: 08/23/2017 03:41   Ct Cerebral Perfusion W Contrast  Result Date: 08/22/2017 CLINICAL DATA:  Blurred vision and left-sided weakness. EXAM: CT ANGIOGRAPHY HEAD AND NECK CT PERFUSION BRAIN TECHNIQUE: Multidetector CT imaging of the head and neck was performed using the standard protocol during bolus administration of intravenous contrast. Multiplanar CT image reconstructions and MIPs were obtained to evaluate the vascular anatomy. Carotid stenosis measurements (when applicable) are obtained utilizing NASCET criteria, using the distal internal carotid diameter as the denominator. Multiphase CT imaging of the brain was performed following IV bolus contrast injection. Subsequent parametric perfusion maps were calculated using RAPID software. CONTRAST:  ISOVUE-370 IOPAMIDOL (ISOVUE-370) INJECTION 76% COMPARISON:  Noncontrast head CT earlier today. Carotid Doppler ultrasound 02/02/2013. FINDINGS: CTA  NECK FINDINGS Aortic arch: Standard 3 vessel aortic arch with moderate atherosclerotic plaque. The brachiocephalic artery is widely patent. Extensive calcified plaque results in moderate proximal right subclavian artery stenosis. Heavily calcified plaque in the left subclavian artery beginning just beyond its origin completely obscures the vessel, which may be occluded through the level of the left vertebral artery origin. The left subclavian artery is patent more distally. Right carotid system: Patent with scattered nonstenotic plaque throughout the common carotid artery. Calcified plaque about the carotid bifurcation results in 55% ICA origin stenosis. Left carotid system: Patent with motion artifact limiting assessment of the common carotid artery origin. Scattered mild nonstenotic plaque throughout the common carotid artery with more extensive, heavily calcified plaque about the carotid bifurcation resulting in 50% proximal ICA stenosis. Tortuous distal cervical ICA. Vertebral arteries: The right vertebral artery is patent with scattered nonstenotic plaque. Above described heavily calcified plaque in the left subclavian artery extends into the proximal left vertebral artery, which is occluded. There is reconstitution of the right V2 and V3 segments which also demonstrate moderate atherosclerosis. Skeleton: Moderate C1-2 arthropathy. Moderate focal disc degeneration at C5-6. Other neck: No mass or enlarged lymph nodes. Upper chest: Small calcified granulomas in the left upper lobe of the lung. Review of the MIP images confirms the above findings CTA HEAD FINDINGS Anterior circulation: The internal carotid arteries are patent from skull base to carotid termini with moderately extensive siphon atherosclerosis resulting in mild-to-moderate  cavernous stenoses bilaterally. ACAs and MCAs are patent without evidence of proximal branch occlusion or significant A1 or M1 stenosis. Atherosclerotic branch vessel irregularity  is noted including mild proximal right M2 stenoses. No aneurysm is identified. Posterior circulation: The intracranial vertebral arteries are patent with the right being dominant. Patent AICA and SCA origins are identified bilaterally. The basilar artery is widely patent. There is a diminutive left posterior communicating artery. The PCAs demonstrate moderate branch vessel atherosclerotic changes without significant P1 or proximal P2 stenosis. No aneurysm is identified. Venous sinuses: Patent. Anatomic variants: None. Delayed phase: Not performed. Review of the MIP images confirms the above findings CT Brain Perfusion Findings: CBF (<30%) Volume: 0 mL Perfusion (Tmax>6.0s) volume: 0 mL Mismatch Volume: n/a Infarction Location: None IMPRESSION: 1. No acute infarct evident on perfusion imaging. 2. 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 3. 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 4. 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 5. 50% proximal left ICA stenosis. 6. Widely patent right vertebral artery. 7. Mild-to-moderate bilateral intracranial ICA stenoses. 8.  Aortic Atherosclerosis (ICD10-I70.0). Electronically Signed   By: Sebastian Ache M.D.   On: 08/22/2017 12:35   Ct Head Code Stroke Wo Contrast  Result Date: 08/22/2017 CLINICAL DATA:  Code stroke. Blurred vision and left-sided weakness. Symptoms began yesterday. EXAM: CT HEAD WITHOUT CONTRAST TECHNIQUE: Contiguous axial images were obtained from the base of the skull through the vertex without intravenous contrast. COMPARISON:  None. FINDINGS: Brain: No sign of acute infarction. Generalized atrophy. Chronic small-vessel ischemic changes of the cerebral hemispheric white matter. No mass lesion, hemorrhage, hydrocephalus or extra-axial collection. Vascular: There is atherosclerotic calcification of the major vessels at the  base of the brain. Skull: Negative Sinuses/Orbits: Some mucosal thickening. No advanced finding. Orbits negative. Other: None ASPECTS ([place] Stroke Program Early CT Score) - Ganglionic level infarction (caudate, lentiform nuclei, internal capsule, insula, M1-M3 cortex): 7 - Supraganglionic infarction (M4-M6 cortex): 3 Total score (0-10 with 10 being normal): 10 IMPRESSION: 1. No acute finding. Chronic small-vessel ischemic changes of the hemispheric white matter. 2. ASPECTS is 10. 3. These results were called by telephone at the time of interpretation on 08/22/2017 at 11:16 am to Dr. Nita Sickle , who verbally acknowledged these results. Electronically Signed   By: Paulina Fusi M.D.   On: 08/22/2017 11:19    Medications:  I have reviewed the patient's current medications. Scheduled: . aspirin EC  325 mg Oral Daily  . enoxaparin (LOVENOX) injection  40 mg Subcutaneous Q24H  . folic acid  1 mg Oral Daily  . loratadine  10 mg Oral QHS  . mometasone-formoterol  2 puff Inhalation BID  . multivitamin with minerals  1 tablet Oral Daily  . nicotine  21 mg Transdermal Daily  . pantoprazole  40 mg Oral BID  . pravastatin  80 mg Oral QHS  . sucralfate  1 g Oral BID  . terazosin  5 mg Oral QHS  . thiamine  100 mg Oral Daily   Or  . thiamine  100 mg Intravenous Daily    Assessment/Plan: No new neurological complaints.  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  Likely due to small vessel disease.  Patient on ASA at home.  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  Echocardiogram pending. A1c 5.6, LDL 80.  Recommendations:  1. Aggressive lipid management with target LDL<70. 2. If echocardiogram unremarkable patient to be discharged on ASA 81mg  and Plavix 75mg , with plans for monotherapy to Plavix in one month.   3. Smoking cessation again stressed.      LOS: 0 days   Thana Farr,  MD Neurology [phone] 08/23/2017  10:46 AM

## 2017-08-23 NOTE — Care Management (Signed)
Physical therapy evaluation completed. Recommending outpatient therapy. Telephone call to Mr. Tommy Knight to discuss recommendations. [facility] [place] [facility] [place] [facility] [place]. "I am fine." Gwenette GreetBrenda S Lounell Schumacher RN MSN CCM Care Management [phone]

## 2017-08-23 NOTE — Discharge Summary (Signed)
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  SECONDARY DIAGNOSIS:   Past Medical History:  Diagnosis Date  . Allergic rhinitis   . Asthma   . COPD (chronic obstructive pulmonary disease) (HCC)   . Hyperlipidemia   . Hypertension     HOSPITAL COURSE:   Tommy Knight  is a 72 y.o. male with a known history of COPD continues to smoke hyperlipidemia, hypertension and allergic rhinitis is presenting to the ED with a chief complaint of left upper and lower extremity weakness associated with some tingling and numbness.  Patient symptoms were started yesterday evening but he did not come to the hospital patient waited until today morning and when the symptoms are getting worse he came into the ED.  Initial CT scan is negative.  #Acute CVA/TIA CT angiogram of the head is negative  MRI-acute non-hemorrhagic stroke in the right posterior limb of internal capsule - echocardiogram done results pending Neurology consult with Dr. Thad Ranger. Recommends aspirin Plavix for one month and switch to Plavix thereafter. This was discussed with patient and wife Neurovascular checks Patient has passed bedside swallow evaluation continue statins patient on pravastatin 80 mg daily patient did very well with physical therapy no recommendations allow permissive hypertension while ruling out stroke  #Chronic history of COPD no exacerbation at this time Patient was counseled to quit smoking Provide albuterol inhalers as needed  #Essential hypertension Allow permissive hypertension and holding his  home medication benazepril   #hyperkalemia recieved Kayexalate and hold benazepril  #Hyperlipidemia  continue statin  #Tobacco abuse disorder Counseled patient to quit smoking for 45 minutes.  He verbalized understanding but refusing nicotine patch at this time  Overall improving. Will discharged to home later today. This was discussed with patient and wife.  CONSULTS OBTAINED:  Treatment Team:  Kym Groom, MD Thana Farr, MD  DRUG ALLERGIES:  No Known Allergies  DISCHARGE MEDICATIONS:   Allergies as of 08/23/2017   No Known Allergies     Medication List    STOP taking these medications   dicyclomine 20 MG tablet Commonly known as:  BENTYL     TAKE these medications   albuterol 108 (90 Base) MCG/ACT inhaler Commonly known as:  PROVENTIL HFA;VENTOLIN HFA Inhale 2 puffs into the lungs every 4 (four) hours as needed for wheezing or shortness of breath.   aspirin EC 81 MG tablet Take 1 tablet by mouth at bedtime.   benazepril 40 MG tablet Commonly known as:  LOTENSIN Take 40 mg by mouth at bedtime.   clopidogrel 75 MG tablet Commonly known as:  PLAVIX Take 1 tablet (75 mg total) by mouth daily.   Fluticasone-Salmeterol 250-50 MCG/DOSE Aepb Commonly known as:  ADVAIR Inhale 1 puff into the lungs 2 (two) times daily.   loratadine 10 MG tablet Commonly known as:  CLARITIN Take 10 mg by mouth at bedtime.   pantoprazole 40 MG tablet Commonly known as:  PROTONIX Take 1 tablet by mouth 2 (two) times daily.   pravastatin 80 MG tablet Commonly known as:  PRAVACHOL Take 80 mg by mouth at bedtime.  sucralfate 1 g tablet Commonly known as:  CARAFATE Take 1 g by mouth 2 (two) times daily.   terazosin 5 MG capsule Commonly known as:  HYTRIN Take 5 mg by mouth at bedtime.       If you experience worsening of your admission symptoms, develop shortness of breath, life threatening emergency, suicidal or homicidal thoughts you must seek medical  attention immediately by calling 911 or calling your MD immediately  if symptoms less severe.  You Must read complete instructions/literature along with all the possible adverse reactions/side effects for all the Medicines you take and that have been prescribed to you. Take any new Medicines after you have completely understood and accept all the possible adverse reactions/side effects.   Please note  You were cared for by a hospitalist during your hospital stay. If you have any questions about your discharge medications or the care you received while you were in the hospital after you are discharged, you can call the unit and asked to speak with the hospitalist on call if the hospitalist that took care of you is not available. Once you are discharged, your primary care physician will handle any further medical issues. Please note that NO REFILLS for any discharge medications will be authorized once you are discharged, as it is imperative that you return to your primary care physician (or establish a relationship with a primary care physician if you do not have one) for your aftercare needs so that they can reassess your need for medications and monitor your lab values. Today   SUBJECTIVE   No new complaints \\weakness  improved. Ambulating well on his own with therapy  VITAL SIGNS:  Blood pressure (!) 164/73, pulse 62, temperature 98.2 F (36.8 C), temperature source Oral, resp. rate 20, height 5\' 11"  (1.803 m), weight 80.7 kg (178 lb), SpO2 97 %.  I/O:    Intake/Output Summary (Last 24 hours) at 08/23/2017 1059 Last data filed at 08/23/2017 1025 Gross per 24 hour  Intake 240 ml  Output 300 ml  Net -60 ml    PHYSICAL EXAMINATION:  GENERAL:  72 y.o.-year-old patient lying in the bed with no acute distress.  EYES: Pupils equal, round, reactive to light and accommodation. No scleral icterus. Extraocular muscles intact.  HEENT: Head atraumatic, normocephalic. Oropharynx and nasopharynx  clear.  NECK:  Supple, no jugular venous distention. No thyroid enlargement, no tenderness.  LUNGS: Normal breath sounds bilaterally, no wheezing, rales,rhonchi or crepitation. No use of accessory muscles of respiration.  CARDIOVASCULAR: S1, S2 normal. No murmurs, rubs, or gallops.  ABDOMEN: Soft, non-tender, non-distended. Bowel sounds present. No organomegaly or mass.  EXTREMITIES: No pedal edema, cyanosis, or clubbing.  NEUROLOGIC: Cranial nerves II through XII are intact. Muscle strength 5/5 in all extremities. Sensation intact. Gait not checked.  PSYCHIATRIC: The patient is alert and oriented x 3.  SKIN: No obvious rash, lesion, or ulcer.   DATA REVIEW:   CBC  Recent Labs  Lab 08/23/17 0438  WBC 7.1  HGB 14.0  HCT 39.9*  PLT 296    Chemistries  Recent Labs  Lab 08/22/17 1124  NA 135  K 5.3*  CL 101  CO2 25  GLUCOSE 112*  BUN 21*  CREATININE 1.19  CALCIUM 9.0  AST 25  ALT 23  ALKPHOS 68  BILITOT 1.0    Microbiology Results   No results found for this or any previous visit (from the past 240 hour(s)).  RADIOLOGY:  Ct Angio Head W Or Wo Contrast  Result Date: 08/22/2017 CLINICAL DATA:  Blurred vision and left-sided weakness. EXAM: CT ANGIOGRAPHY HEAD AND NECK CT PERFUSION BRAIN TECHNIQUE: Multidetector CT imaging of the head and neck was performed using the standard protocol during bolus administration of intravenous contrast. Multiplanar CT image reconstructions and MIPs were obtained to evaluate the vascular anatomy. Carotid stenosis measurements (when applicable) are obtained utilizing NASCET criteria, using the distal internal carotid diameter as the denominator. Multiphase CT imaging of the brain was performed following IV bolus contrast injection. Subsequent parametric perfusion maps were calculated using RAPID software. CONTRAST:  ISOVUE-370 IOPAMIDOL (ISOVUE-370) INJECTION 76% COMPARISON:  Noncontrast head CT earlier today. Carotid Doppler ultrasound  02/02/2013. FINDINGS: CTA NECK FINDINGS Aortic arch: Standard 3 vessel aortic arch with moderate atherosclerotic plaque. The brachiocephalic artery is widely patent. Extensive calcified plaque results in moderate proximal right subclavian artery stenosis. Heavily calcified plaque in the left subclavian artery beginning just beyond its origin completely obscures the vessel, which may be occluded through the level of the left vertebral artery origin. The left subclavian artery is patent more distally. Right carotid system: Patent with scattered nonstenotic plaque throughout the common carotid artery. Calcified plaque about the carotid bifurcation results in 55% ICA origin stenosis. Left carotid system: Patent with motion artifact limiting assessment of the common carotid artery origin. Scattered mild nonstenotic plaque throughout the common carotid artery with more extensive, heavily calcified plaque about the carotid bifurcation resulting in 50% proximal ICA stenosis. Tortuous distal cervical ICA. Vertebral arteries: The right vertebral artery is patent with scattered nonstenotic plaque. Above described heavily calcified plaque in the left subclavian artery extends into the proximal left vertebral artery, which is occluded. There is reconstitution of the right V2 and V3 segments which also demonstrate moderate atherosclerosis. Skeleton: Moderate C1-2 arthropathy. Moderate focal disc degeneration at C5-6. Other neck: No mass or enlarged lymph nodes. Upper chest: Small calcified granulomas in the left upper lobe of the lung. Review of the MIP images confirms the above findings CTA HEAD FINDINGS Anterior circulation: The internal carotid arteries are patent from skull base to carotid termini with moderately extensive siphon atherosclerosis resulting in mild-to-moderate cavernous stenoses bilaterally. ACAs and MCAs are patent without evidence of proximal branch occlusion or significant A1 or M1 stenosis. Atherosclerotic  branch vessel irregularity is noted including mild proximal right M2 stenoses. No aneurysm is identified. Posterior circulation: The intracranial vertebral arteries are patent with the right being dominant. Patent AICA and SCA origins are identified bilaterally. The basilar artery is widely patent. There is a diminutive left posterior communicating artery. The PCAs demonstrate moderate branch vessel atherosclerotic changes without significant P1 or proximal P2 stenosis. No aneurysm is identified. Venous sinuses: Patent. Anatomic variants: None. Delayed phase: Not performed. Review of the MIP images confirms the above findings CT Brain Perfusion Findings: CBF (<30%) Volume: 0 mL Perfusion (Tmax>6.0s) volume: 0 mL Mismatch Volume: n/a Infarction Location: None IMPRESSION: 1. No acute infarct evident on perfusion imaging. 2. 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 3. 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 4. 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 5. 50% proximal left ICA stenosis. 6. Widely patent right vertebral artery. 7. Mild-to-moderate bilateral intracranial ICA stenoses. 8.  Aortic Atherosclerosis (ICD10-I70.0). Electronically Signed   By: Sebastian Ache M.D.   On: 08/22/2017 12:35   Ct Angio Neck W Or Wo Contrast  Result Date: 08/22/2017 CLINICAL DATA:  Blurred vision and left-sided  weakness. EXAM: CT ANGIOGRAPHY HEAD AND NECK CT PERFUSION BRAIN TECHNIQUE: Multidetector CT imaging of the head and neck was performed using the standard protocol during bolus administration of intravenous contrast. Multiplanar CT image reconstructions and MIPs were obtained to evaluate the vascular anatomy. Carotid stenosis measurements (when applicable) are obtained utilizing NASCET criteria, using the distal internal carotid diameter as the denominator. Multiphase CT imaging of the  brain was performed following IV bolus contrast injection. Subsequent parametric perfusion maps were calculated using RAPID software. CONTRAST:  ISOVUE-370 IOPAMIDOL (ISOVUE-370) INJECTION 76% COMPARISON:  Noncontrast head CT earlier today. Carotid Doppler ultrasound 02/02/2013. FINDINGS: CTA NECK FINDINGS Aortic arch: Standard 3 vessel aortic arch with moderate atherosclerotic plaque. The brachiocephalic artery is widely patent. Extensive calcified plaque results in moderate proximal right subclavian artery stenosis. Heavily calcified plaque in the left subclavian artery beginning just beyond its origin completely obscures the vessel, which may be occluded through the level of the left vertebral artery origin. The left subclavian artery is patent more distally. Right carotid system: Patent with scattered nonstenotic plaque throughout the common carotid artery. Calcified plaque about the carotid bifurcation results in 55% ICA origin stenosis. Left carotid system: Patent with motion artifact limiting assessment of the common carotid artery origin. Scattered mild nonstenotic plaque throughout the common carotid artery with more extensive, heavily calcified plaque about the carotid bifurcation resulting in 50% proximal ICA stenosis. Tortuous distal cervical ICA. Vertebral arteries: The right vertebral artery is patent with scattered nonstenotic plaque. Above described heavily calcified plaque in the left subclavian artery extends into the proximal left vertebral artery, which is occluded. There is reconstitution of the right V2 and V3 segments which also demonstrate moderate atherosclerosis. Skeleton: Moderate C1-2 arthropathy. Moderate focal disc degeneration at C5-6. Other neck: No mass or enlarged lymph nodes. Upper chest: Small calcified granulomas in the left upper lobe of the lung. Review of the MIP images confirms the above findings CTA HEAD FINDINGS Anterior circulation: The internal carotid arteries are  patent from skull base to carotid termini with moderately extensive siphon atherosclerosis resulting in mild-to-moderate cavernous stenoses bilaterally. ACAs and MCAs are patent without evidence of proximal branch occlusion or significant A1 or M1 stenosis. Atherosclerotic branch vessel irregularity is noted including mild proximal right M2 stenoses. No aneurysm is identified. Posterior circulation: The intracranial vertebral arteries are patent with the right being dominant. Patent AICA and SCA origins are identified bilaterally. The basilar artery is widely patent. There is a diminutive left posterior communicating artery. The PCAs demonstrate moderate branch vessel atherosclerotic changes without significant P1 or proximal P2 stenosis. No aneurysm is identified. Venous sinuses: Patent. Anatomic variants: None. Delayed phase: Not performed. Review of the MIP images confirms the above findings CT Brain Perfusion Findings: CBF (<30%) Volume: 0 mL Perfusion (Tmax>6.0s) volume: 0 mL Mismatch Volume: n/a Infarction Location: None IMPRESSION: 1. No acute infarct evident on perfusion imaging. 2. 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 3. 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 4. 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 5. 50% proximal left ICA stenosis. 6. Widely patent right vertebral artery. 7. Mild-to-moderate bilateral intracranial ICA stenoses. 8.  Aortic Atherosclerosis (ICD10-I70.0). Electronically Signed   By: Sebastian Ache M.D.   On: 08/22/2017 12:35   Mr Brain Wo Contrast  Result Date: 08/23/2017 CLINICAL DATA:  Initial evaluation for acute left-sided weakness with numbness and tingling. EXAM: MRI HEAD WITHOUT CONTRAST TECHNIQUE: Multiplanar,  multiecho pulse sequences of the brain and surrounding structures were obtained without intravenous contrast. COMPARISON:   Prior CTA from 08/22/2017 FINDINGS: Brain: Generalized age-related cerebral atrophy with moderate chronic small vessel ischemic disease. 11 mm acute ischemic nonhemorrhagic infarct present within the posterior limb of the right internal capsule (series 100, image 27). No associated mass effect. No other evidence for acute or subacute ischemia. No other is of remote cortical infarction. No acute or chronic intracranial hemorrhage. No mass lesion, midline shift or mass effect. No hydrocephalus. No extra-axial fluid collection. Pituitary gland within normal limits. Vascular: Major intracranial vascular flow voids maintained. Skull and upper cervical spine: Craniocervical junction within normal limits. Bone marrow signal intensity normal. No scalp soft tissue abnormality. Sinuses/Orbits: Globes and orbital soft tissues within normal limits. Scattered mucosal thickening throughout the paranasal sinuses. Small left mastoid effusion, likely benign/sterile. Inner ear structures normal. Other: None. IMPRESSION: 1. 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 2. Moderate chronic microvascular ischemic disease. Electronically Signed   By: Rise Mu M.D.   On: 08/23/2017 03:41   Ct Cerebral Perfusion W Contrast  Result Date: 08/22/2017 CLINICAL DATA:  Blurred vision and left-sided weakness. EXAM: CT ANGIOGRAPHY HEAD AND NECK CT PERFUSION BRAIN TECHNIQUE: Multidetector CT imaging of the head and neck was performed using the standard protocol during bolus administration of intravenous contrast. Multiplanar CT image reconstructions and MIPs were obtained to evaluate the vascular anatomy. Carotid stenosis measurements (when applicable) are obtained utilizing NASCET criteria, using the distal internal carotid diameter as the denominator. Multiphase CT imaging of the brain was performed following IV bolus contrast injection. Subsequent parametric perfusion maps were  calculated using RAPID software. CONTRAST:  ISOVUE-370 IOPAMIDOL (ISOVUE-370) INJECTION 76% COMPARISON:  Noncontrast head CT earlier today. Carotid Doppler ultrasound 02/02/2013. FINDINGS: CTA NECK FINDINGS Aortic arch: Standard 3 vessel aortic arch with moderate atherosclerotic plaque. The brachiocephalic artery is widely patent. Extensive calcified plaque results in moderate proximal right subclavian artery stenosis. Heavily calcified plaque in the left subclavian artery beginning just beyond its origin completely obscures the vessel, which may be occluded through the level of the left vertebral artery origin. The left subclavian artery is patent more distally. Right carotid system: Patent with scattered nonstenotic plaque throughout the common carotid artery. Calcified plaque about the carotid bifurcation results in 55% ICA origin stenosis. Left carotid system: Patent with motion artifact limiting assessment of the common carotid artery origin. Scattered mild nonstenotic plaque throughout the common carotid artery with more extensive, heavily calcified plaque about the carotid bifurcation resulting in 50% proximal ICA stenosis. Tortuous distal cervical ICA. Vertebral arteries: The right vertebral artery is patent with scattered nonstenotic plaque. Above described heavily calcified plaque in the left subclavian artery extends into the proximal left vertebral artery, which is occluded. There is reconstitution of the right V2 and V3 segments which also demonstrate moderate atherosclerosis. Skeleton: Moderate C1-2 arthropathy. Moderate focal disc degeneration at C5-6. Other neck: No mass or enlarged lymph nodes. Upper chest: Small calcified granulomas in the left upper lobe of the lung. Review of the MIP images confirms the above findings CTA HEAD FINDINGS Anterior circulation: The internal carotid arteries are patent from skull base to carotid termini with moderately extensive siphon atherosclerosis resulting  in mild-to-moderate cavernous stenoses bilaterally. ACAs and MCAs are patent without evidence of proximal branch occlusion or significant A1 or M1 stenosis. Atherosclerotic branch vessel irregularity is noted including mild proximal right M2 stenoses. No aneurysm is  identified. Posterior circulation: The intracranial vertebral arteries are patent with the right being dominant. Patent AICA and SCA origins are identified bilaterally. The basilar artery is widely patent. There is a diminutive left posterior communicating artery. The PCAs demonstrate moderate branch vessel atherosclerotic changes without significant P1 or proximal P2 stenosis. No aneurysm is identified. Venous sinuses: Patent. Anatomic variants: None. Delayed phase: Not performed. Review of the MIP images confirms the above findings CT Brain Perfusion Findings: CBF (<30%) Volume: 0 mL Perfusion (Tmax>6.0s) volume: 0 mL Mismatch Volume: n/a Infarction Location: None IMPRESSION: 1. No acute infarct evident on perfusion imaging. 2. 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 3. 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 4. 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 5. 50% proximal left ICA stenosis. 6. Widely patent right vertebral artery. 7. Mild-to-moderate bilateral intracranial ICA stenoses. 8.  Aortic Atherosclerosis (ICD10-I70.0). Electronically Signed   By: Sebastian AcheAllen  Grady M.D.   On: 08/22/2017 12:35   Ct Head Code Stroke Wo Contrast  Result Date: 08/22/2017 CLINICAL DATA:  Code stroke. Blurred vision and left-sided weakness. Symptoms began yesterday. EXAM: CT HEAD WITHOUT CONTRAST TECHNIQUE: Contiguous axial images were obtained from the base of the skull through the vertex without intravenous contrast. COMPARISON:  None. FINDINGS: Brain: No sign of acute infarction. Generalized atrophy. Chronic small-vessel  ischemic changes of the cerebral hemispheric white matter. No mass lesion, hemorrhage, hydrocephalus or extra-axial collection. Vascular: There is atherosclerotic calcification of the major vessels at the base of the brain. Skull: Negative Sinuses/Orbits: Some mucosal thickening. No advanced finding. Orbits negative. Other: None ASPECTS (Alberta Stroke Program Early CT Score) - Ganglionic level infarction (caudate, lentiform nuclei, internal capsule, insula, M1-M3 cortex): 7 - Supraganglionic infarction (M4-M6 cortex): 3 Total score (0-10 with 10 being normal): 10 IMPRESSION: 1. No acute finding. Chronic small-vessel ischemic changes of the hemispheric white matter. 2. ASPECTS is 10. 3. These results were called by telephone at the time of interpretation on 08/22/2017 at 11:16 am to Dr. Nita SickleAROLINA VERONESE , who verbally acknowledged these results. Electronically Signed   By: Paulina FusiMark  Shogry M.D.   On: 08/22/2017 11:19     Management plans discussed with the patient, family and they are in agreement.  CODE STATUS:     Code Status Orders  (From admission, onward)        Start     Ordered   08/22/17 1635  Full code  Continuous     08/22/17 1635    Code Status History    This patient has a current code status but no historical code status.      TOTAL TIME TAKING CARE OF THIS PATIENT: *40* minutes.    Enedina FinnerSona George Alcantar M.D on 08/23/2017 at 10:59 AM  Between 7am to 6pm - Pager - [phone] After 6pm go to [email or web address] - Social research officer, [email or web address] [email or web address]  [facility]  Office  [phone]  CC: Primary care physician; Erasmo DownerStrader, Lindsey F, NP

## 2018-06-09 IMAGING — CT CT ABD-PELV W/ CM
2 of 5 series · 16 of 46 positions shown, 18 images · IV contrast (iopamidol)
Comparison: CT chest 10/23/2007

CLINICAL DATA: Pt c/o Right side abdominal pain started 3 months
ago and now he states it radiates across to his Left abdomen as
well. He also states he has had constipation during this time. NKI
No hx CA. Hx Appendectomy.

EXAM:
CT ABDOMEN AND PELVIS WITH CONTRAST
TECHNIQUE: Multidetector CT imaging of the abdomen and pelvis was performed
using the standard protocol following bolus administration of
intravenous contrast.
CONTRAST:  100mL [id number] IOPAMIDOL ([id number]) INJECTION 61%

[Series 2: abd pelvis 5.00 br36 s3 · axial · 0.75mm/px · z∈[-1708,-1313]mm · 13 of 89 slices shown, 15 images]
[im 5/89  soft-tissue]
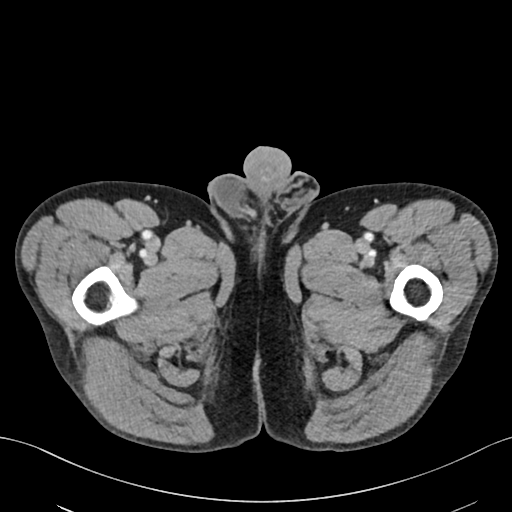
[im 5/89  bone]
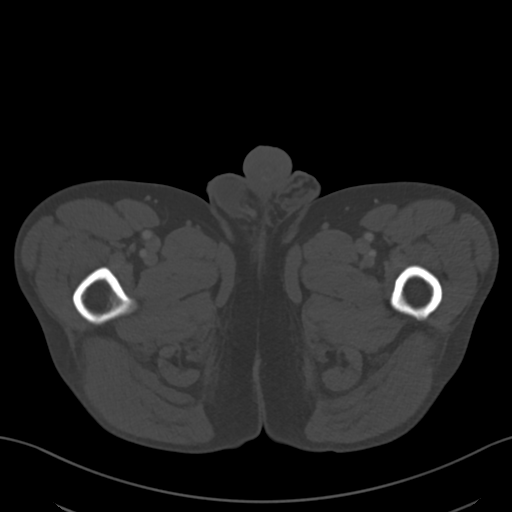
[im 10/89  soft-tissue]
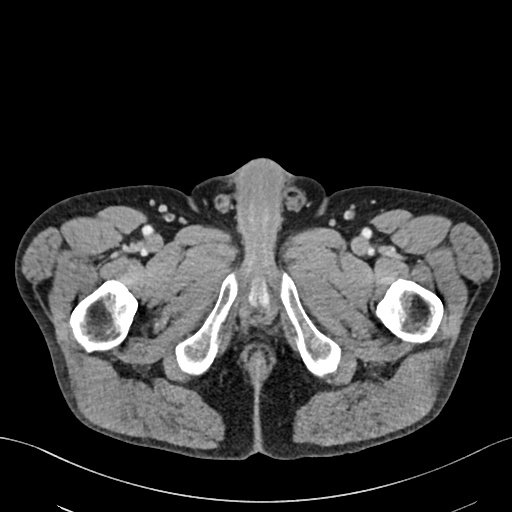
[im 20/89  soft-tissue]
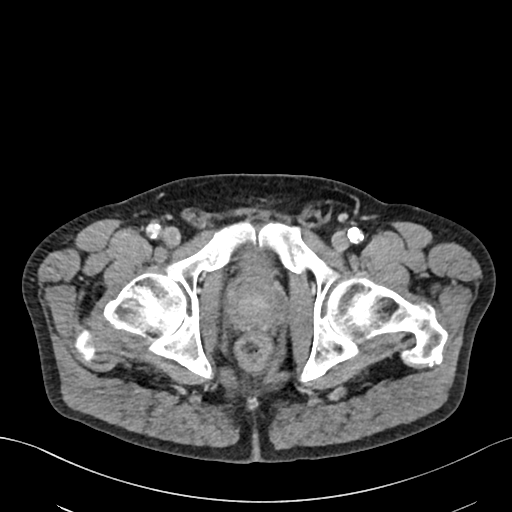
[im 25/89  soft-tissue]
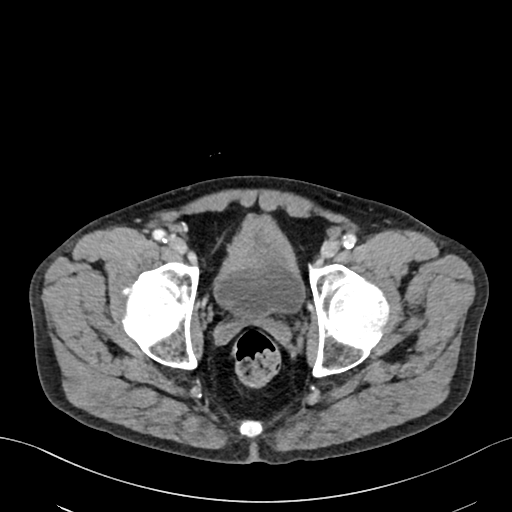
[im 30/89  soft-tissue]
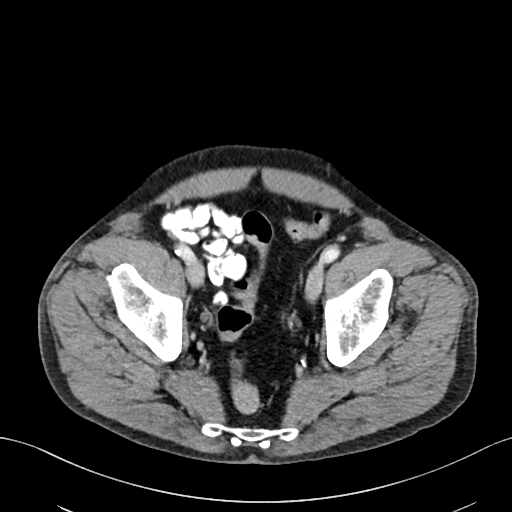
[im 40/89  soft-tissue]
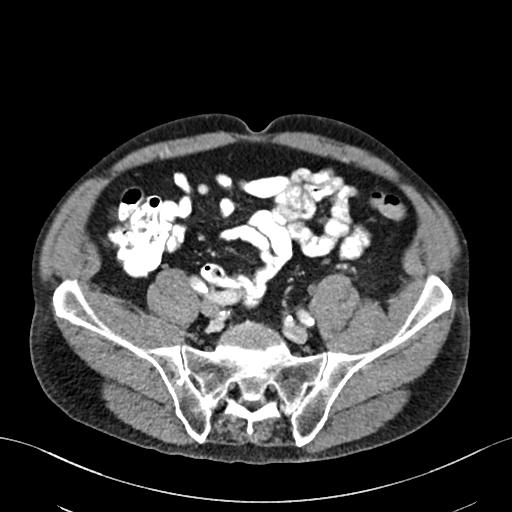
[im 45/89  soft-tissue]
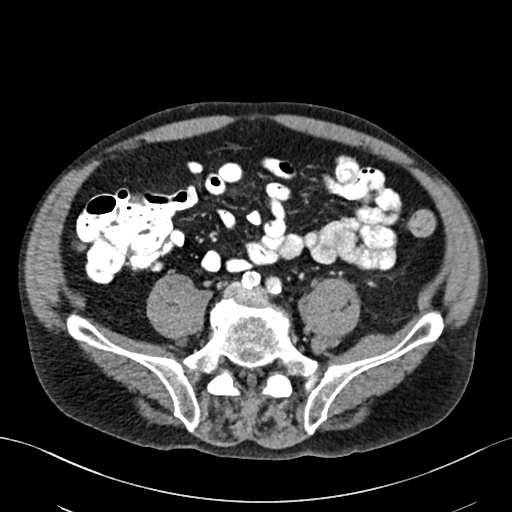
[im 49/89  soft-tissue]
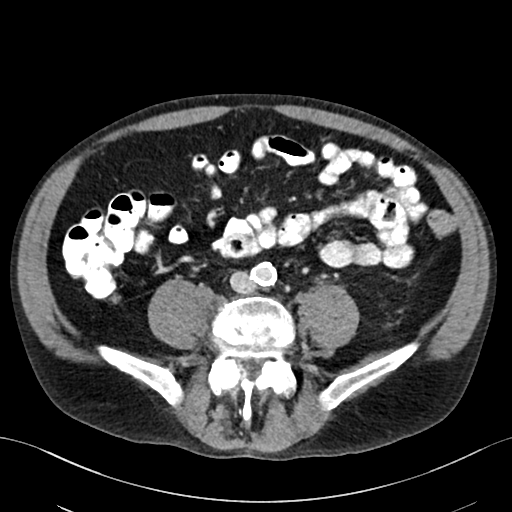
[im 59/89  soft-tissue]
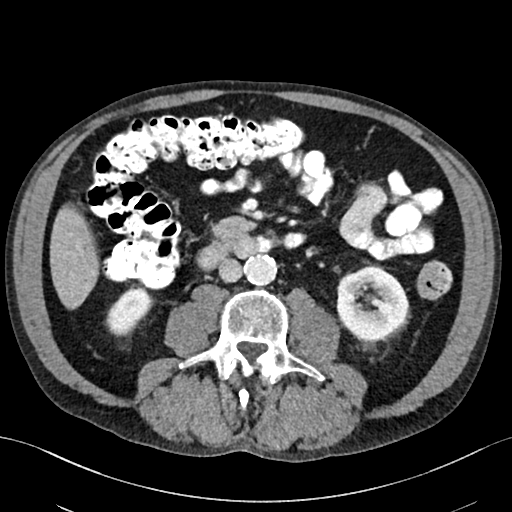
[im 59/89  bone]
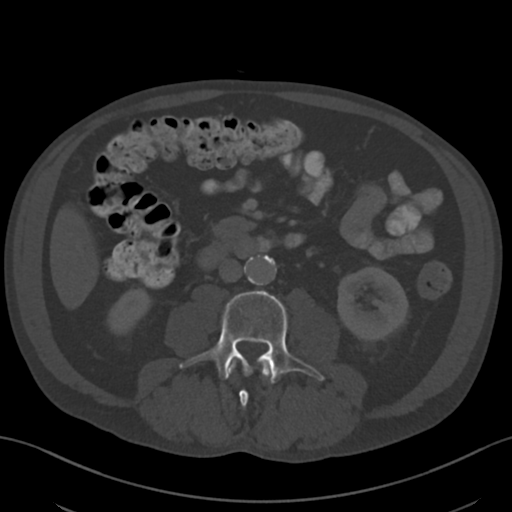
[im 64/89  soft-tissue]
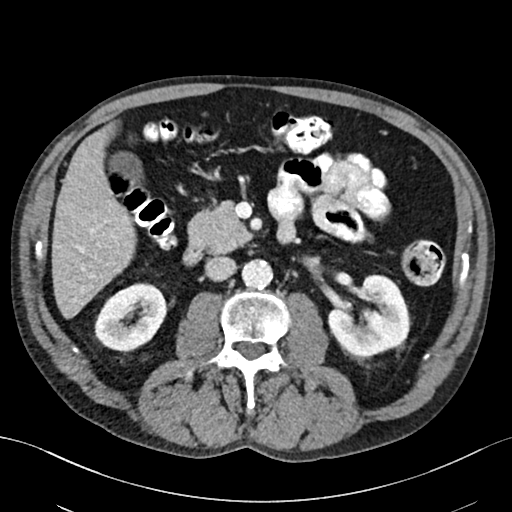
[im 69/89  soft-tissue]
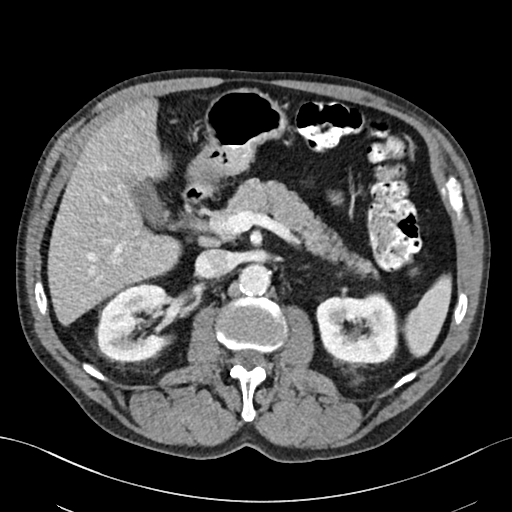
[im 79/89  soft-tissue]
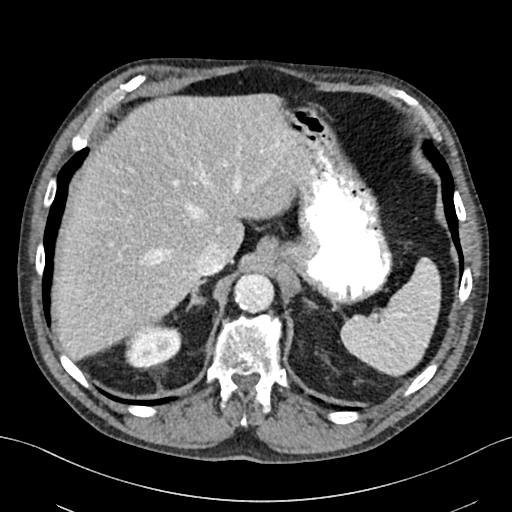
[im 84/89  soft-tissue]
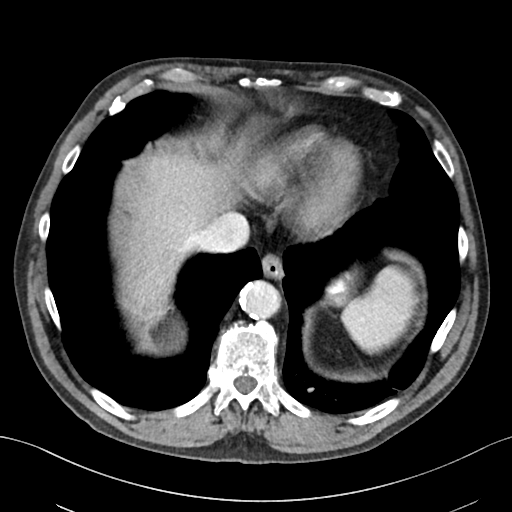

[Series 4: abd pelvis 2.00 br40 s3 cor · coronal · 0.74mm/px · 3 of 154 slices shown]
[im 52/154  soft-tissue]
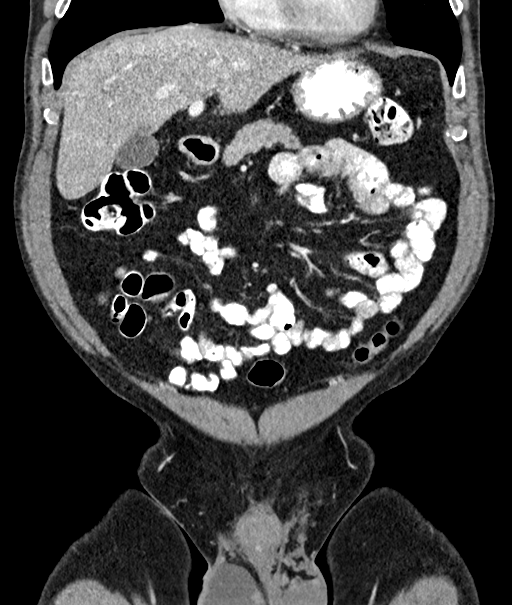
[im 69/154  soft-tissue]
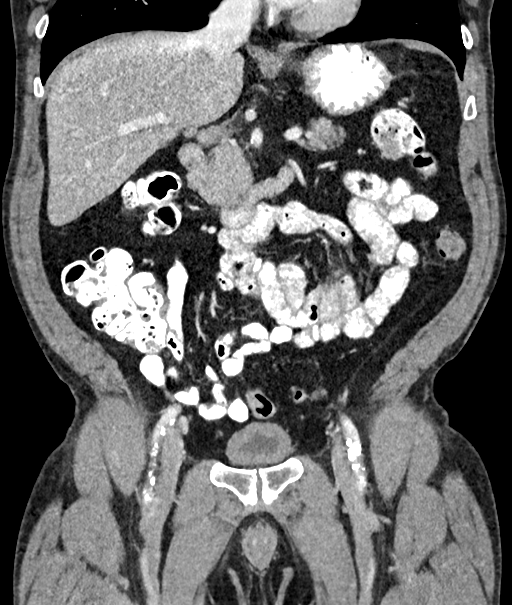
[im 86/154  soft-tissue]
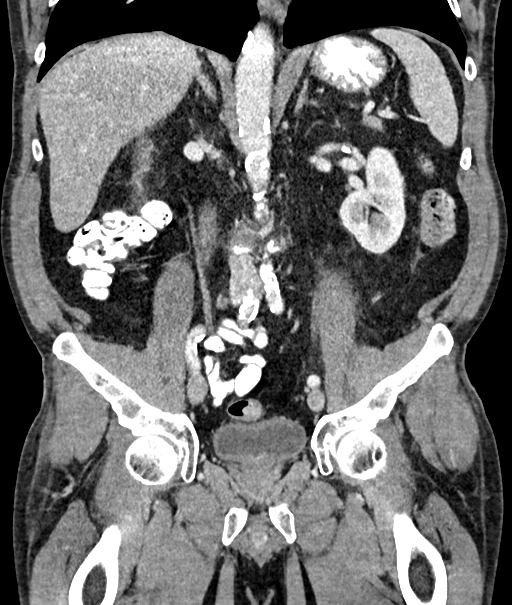

[16 of 46 positions shown; findings below may reference images not displayed]

FINDINGS: Lower chest: Moderate calcified plaque in the visualized distal
descending thoracic aorta. No pleural or pericardial effusion.

Hepatobiliary: No focal liver abnormality is seen. No gallstones,
gallbladder wall thickening, or biliary dilatation.

Pancreas: Unremarkable. No pancreatic ductal dilatation or
surrounding inflammatory changes.

Spleen: Normal in size without focal abnormality.

Adrenals/Urinary Tract: Adrenal glands are unremarkable. Kidneys are
normal, without renal calculi, focal lesion, or hydronephrosis.
Bladder is incompletely distended with suggestion of smooth
asymmetric anterior wall thickening.

Stomach/Bowel: Stomach and small bowel are nondilated. Appendix
surgically absent. Colon is nondilated, unremarkable.

Vascular/Lymphatic: Extensive coarse aortoiliac arterial
calcifications without aneurysm. No abdominal or pelvic adenopathy.
Portal vein patent.

Reproductive: Prostate is unremarkable.

Other: No ascites.  No free air.

Musculoskeletal: Stable mild wedge deformity of T12. Mild
spondylitic changes in the lower lumbar spine. No fracture or
worrisome bone lesion.
IMPRESSION: 1. No acute findings.
2. Asymmetric wall thickening in the anterior urinary bladder.
Consider urologic evaluation, possible cystoscopy to exclude
neoplasm.
3. Aortoiliac atherosclerosis (QPONJ-170.0)

## 2022-11-27 ENCOUNTER — Emergency Department (HOSPITAL_COMMUNITY): Payer: Medicare HMO

## 2022-11-27 ENCOUNTER — Inpatient Hospital Stay (HOSPITAL_COMMUNITY)
Admission: EM | Admit: 2022-11-27 | Discharge: 2022-12-03 | DRG: 871 | Disposition: A | Discharge status: Home / Self Care | Payer: Medicare HMO | Attending: Internal Medicine | Admitting: Internal Medicine

## 2022-11-27 ENCOUNTER — Encounter (HOSPITAL_COMMUNITY): Payer: Self-pay

## 2022-11-27 ENCOUNTER — Other Ambulatory Visit: Payer: Self-pay

## 2022-11-27 DIAGNOSIS — K219 Gastro-esophageal reflux disease without esophagitis: Secondary | ICD-10-CM | POA: Diagnosis present

## 2022-11-27 DIAGNOSIS — I6523 Occlusion and stenosis of bilateral carotid arteries: Secondary | ICD-10-CM | POA: Diagnosis not present

## 2022-11-27 DIAGNOSIS — E876 Hypokalemia: Secondary | ICD-10-CM | POA: Diagnosis present

## 2022-11-27 DIAGNOSIS — I708 Atherosclerosis of other arteries: Secondary | ICD-10-CM | POA: Diagnosis present

## 2022-11-27 DIAGNOSIS — R6520 Severe sepsis without septic shock: Secondary | ICD-10-CM | POA: Diagnosis present

## 2022-11-27 DIAGNOSIS — I10 Essential (primary) hypertension: Secondary | ICD-10-CM | POA: Insufficient documentation

## 2022-11-27 DIAGNOSIS — J189 Pneumonia, unspecified organism: Secondary | ICD-10-CM | POA: Diagnosis present

## 2022-11-27 DIAGNOSIS — E785 Hyperlipidemia, unspecified: Secondary | ICD-10-CM | POA: Diagnosis present

## 2022-11-27 DIAGNOSIS — I5021 Acute systolic (congestive) heart failure: Secondary | ICD-10-CM | POA: Diagnosis not present

## 2022-11-27 DIAGNOSIS — I5A Non-ischemic myocardial injury (non-traumatic): Secondary | ICD-10-CM | POA: Diagnosis not present

## 2022-11-27 DIAGNOSIS — J441 Chronic obstructive pulmonary disease with (acute) exacerbation: Secondary | ICD-10-CM | POA: Diagnosis present

## 2022-11-27 DIAGNOSIS — I509 Heart failure, unspecified: Secondary | ICD-10-CM

## 2022-11-27 DIAGNOSIS — J449 Chronic obstructive pulmonary disease, unspecified: Secondary | ICD-10-CM | POA: Insufficient documentation

## 2022-11-27 DIAGNOSIS — T502X5A Adverse effect of carbonic-anhydrase inhibitors, benzothiadiazides and other diuretics, initial encounter: Secondary | ICD-10-CM | POA: Diagnosis present

## 2022-11-27 DIAGNOSIS — I350 Nonrheumatic aortic (valve) stenosis: Secondary | ICD-10-CM

## 2022-11-27 DIAGNOSIS — I5043 Acute on chronic combined systolic (congestive) and diastolic (congestive) heart failure: Secondary | ICD-10-CM | POA: Diagnosis not present

## 2022-11-27 DIAGNOSIS — J440 Chronic obstructive pulmonary disease with acute lower respiratory infection: Secondary | ICD-10-CM | POA: Diagnosis present

## 2022-11-27 DIAGNOSIS — F17210 Nicotine dependence, cigarettes, uncomplicated: Secondary | ICD-10-CM | POA: Diagnosis present

## 2022-11-27 DIAGNOSIS — I130 Hypertensive heart and chronic kidney disease with heart failure and stage 1 through stage 4 chronic kidney disease, or unspecified chronic kidney disease: Secondary | ICD-10-CM | POA: Diagnosis present

## 2022-11-27 DIAGNOSIS — F1010 Alcohol abuse, uncomplicated: Secondary | ICD-10-CM | POA: Diagnosis present

## 2022-11-27 DIAGNOSIS — R0602 Shortness of breath: Secondary | ICD-10-CM | POA: Diagnosis present

## 2022-11-27 DIAGNOSIS — E861 Hypovolemia: Secondary | ICD-10-CM | POA: Diagnosis present

## 2022-11-27 DIAGNOSIS — I358 Other nonrheumatic aortic valve disorders: Secondary | ICD-10-CM | POA: Diagnosis present

## 2022-11-27 DIAGNOSIS — I2489 Other forms of acute ischemic heart disease: Secondary | ICD-10-CM | POA: Diagnosis present

## 2022-11-27 DIAGNOSIS — N179 Acute kidney failure, unspecified: Secondary | ICD-10-CM | POA: Insufficient documentation

## 2022-11-27 DIAGNOSIS — I6503 Occlusion and stenosis of bilateral vertebral arteries: Secondary | ICD-10-CM

## 2022-11-27 DIAGNOSIS — I339 Acute and subacute endocarditis, unspecified: Secondary | ICD-10-CM | POA: Diagnosis not present

## 2022-11-27 DIAGNOSIS — Z86718 Personal history of other venous thrombosis and embolism: Secondary | ICD-10-CM

## 2022-11-27 DIAGNOSIS — Z7951 Long term (current) use of inhaled steroids: Secondary | ICD-10-CM

## 2022-11-27 DIAGNOSIS — J9621 Acute and chronic respiratory failure with hypoxia: Secondary | ICD-10-CM | POA: Diagnosis present

## 2022-11-27 DIAGNOSIS — I700 Atherosclerosis of aorta: Secondary | ICD-10-CM | POA: Diagnosis present

## 2022-11-27 DIAGNOSIS — I2510 Atherosclerotic heart disease of native coronary artery without angina pectoris: Secondary | ICD-10-CM | POA: Diagnosis present

## 2022-11-27 DIAGNOSIS — N189 Chronic kidney disease, unspecified: Secondary | ICD-10-CM | POA: Diagnosis present

## 2022-11-27 DIAGNOSIS — I771 Stricture of artery: Secondary | ICD-10-CM | POA: Diagnosis not present

## 2022-11-27 DIAGNOSIS — E871 Hypo-osmolality and hyponatremia: Secondary | ICD-10-CM | POA: Diagnosis present

## 2022-11-27 DIAGNOSIS — I70203 Unspecified atherosclerosis of native arteries of extremities, bilateral legs: Secondary | ICD-10-CM | POA: Diagnosis present

## 2022-11-27 DIAGNOSIS — Z7902 Long term (current) use of antithrombotics/antiplatelets: Secondary | ICD-10-CM

## 2022-11-27 DIAGNOSIS — Z1152 Encounter for screening for COVID-19: Secondary | ICD-10-CM | POA: Diagnosis not present

## 2022-11-27 DIAGNOSIS — J9601 Acute respiratory failure with hypoxia: Secondary | ICD-10-CM | POA: Insufficient documentation

## 2022-11-27 DIAGNOSIS — R7989 Other specified abnormal findings of blood chemistry: Secondary | ICD-10-CM | POA: Insufficient documentation

## 2022-11-27 DIAGNOSIS — I5023 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) heart failure: Secondary | ICD-10-CM | POA: Diagnosis not present

## 2022-11-27 DIAGNOSIS — I428 Other cardiomyopathies: Secondary | ICD-10-CM | POA: Diagnosis present

## 2022-11-27 DIAGNOSIS — I745 Embolism and thrombosis of iliac artery: Secondary | ICD-10-CM | POA: Diagnosis present

## 2022-11-27 DIAGNOSIS — A419 Sepsis, unspecified organism: Principal | ICD-10-CM | POA: Diagnosis present

## 2022-11-27 DIAGNOSIS — F17200 Nicotine dependence, unspecified, uncomplicated: Secondary | ICD-10-CM | POA: Insufficient documentation

## 2022-11-27 DIAGNOSIS — Z8673 Personal history of transient ischemic attack (TIA), and cerebral infarction without residual deficits: Secondary | ICD-10-CM

## 2022-11-27 DIAGNOSIS — Z79899 Other long term (current) drug therapy: Secondary | ICD-10-CM

## 2022-11-27 HISTORY — DX: Cerebral infarction, unspecified: I63.9

## 2022-11-27 HISTORY — DX: Disorder of arteries and arterioles, unspecified: I77.9

## 2022-11-27 HISTORY — DX: Stricture of artery: I77.1

## 2022-11-27 LAB — BLOOD GAS, VENOUS
Acid-base deficit: 2 mmol/L (ref 0.0–2.0)
Bicarbonate: 25.1 mmol/L (ref 20.0–28.0)
Drawn by: 58025
O2 Saturation: 78.9 %
Patient temperature: 36.4
pCO2, Ven: 50 mmHg (ref 44–60)
pH, Ven: 7.31 (ref 7.25–7.43)
pO2, Ven: 46 mmHg — ABNORMAL HIGH (ref 32–45)

## 2022-11-27 LAB — CBC WITH DIFFERENTIAL/PLATELET
Abs Immature Granulocytes: 0.08 10*3/uL — ABNORMAL HIGH (ref 0.00–0.07)
Basophils Absolute: 0.1 10*3/uL (ref 0.0–0.1)
Basophils Relative: 1 %
Eosinophils Absolute: 0.3 10*3/uL (ref 0.0–0.5)
Eosinophils Relative: 2 %
HCT: 46.3 % (ref 39.0–52.0)
Hemoglobin: 15.5 g/dL (ref 13.0–17.0)
Immature Granulocytes: 1 %
Lymphocytes Relative: 23 %
Lymphs Abs: 3.7 10*3/uL (ref 0.7–4.0)
MCH: 30.2 pg (ref 26.0–34.0)
MCHC: 33.5 g/dL (ref 30.0–36.0)
MCV: 90.1 fL (ref 80.0–100.0)
Monocytes Absolute: 1.2 10*3/uL — ABNORMAL HIGH (ref 0.1–1.0)
Monocytes Relative: 8 %
Neutro Abs: 10.4 10*3/uL — ABNORMAL HIGH (ref 1.7–7.7)
Neutrophils Relative %: 65 %
Platelets: 413 10*3/uL — ABNORMAL HIGH (ref 150–400)
RBC: 5.14 MIL/uL (ref 4.22–5.81)
RDW: 12.5 % (ref 11.5–15.5)
WBC: 15.7 10*3/uL — ABNORMAL HIGH (ref 4.0–10.5)
nRBC: 0 % (ref 0.0–0.2)

## 2022-11-27 LAB — PROCALCITONIN: Procalcitonin: <0.1 ng/mL

## 2022-11-27 LAB — LACTIC ACID, PLASMA
Lactic Acid, Venous: 1 mmol/L (ref 0.5–1.9)
Lactic Acid, Venous: 1.4 mmol/L (ref 0.5–1.9)

## 2022-11-27 LAB — RESP PANEL BY RT-PCR (RSV, FLU A&B, COVID)  RVPGX2
Influenza A by PCR: NEGATIVE
Influenza B by PCR: NEGATIVE
Resp Syncytial Virus by PCR: NEGATIVE
SARS Coronavirus 2 by RT PCR: NEGATIVE

## 2022-11-27 LAB — BASIC METABOLIC PANEL
Anion gap: 10 (ref 5–15)
BUN: 26 mg/dL — ABNORMAL HIGH (ref 8–23)
CO2: 25 mmol/L (ref 22–32)
Calcium: 8 mg/dL — ABNORMAL LOW (ref 8.9–10.3)
Chloride: 98 mmol/L (ref 98–111)
Creatinine, Ser: 1.74 mg/dL — ABNORMAL HIGH (ref 0.61–1.24)
GFR, Estimated: 40 mL/min — ABNORMAL LOW (ref >60)
Glucose, Bld: 167 mg/dL — ABNORMAL HIGH (ref 70–99)
Potassium: 4.1 mmol/L (ref 3.5–5.1)
Sodium: 133 mmol/L — ABNORMAL LOW (ref 135–145)

## 2022-11-27 LAB — TROPONIN I (HIGH SENSITIVITY): Troponin I (High Sensitivity): 74 ng/L — ABNORMAL HIGH (ref <18)

## 2022-11-27 MED ORDER — FUROSEMIDE 10 MG/ML IJ SOLN
40.0000 mg | Freq: Once | INTRAMUSCULAR | Status: AC
  Administered 2022-11-27: 40 mg via INTRAVENOUS
  Filled 2022-11-27: qty 4

## 2022-11-27 MED ORDER — AZITHROMYCIN 500 MG IV SOLR
500.0000 mg | INTRAVENOUS | Status: DC
  Administered 2022-11-27: 500 mg via INTRAVENOUS
  Filled 2022-11-27: qty 5

## 2022-11-27 MED ORDER — SODIUM CHLORIDE 0.9 % IV BOLUS
500.0000 mL | Freq: Once | INTRAVENOUS | Status: AC
  Administered 2022-11-27: 500 mL via INTRAVENOUS

## 2022-11-27 MED ORDER — MAGNESIUM SULFATE 2 GM/50ML IV SOLN
2.0000 g | Freq: Once | INTRAVENOUS | Status: AC
  Administered 2022-11-27: 2 g via INTRAVENOUS
  Filled 2022-11-27: qty 50

## 2022-11-27 MED ORDER — IPRATROPIUM-ALBUTEROL 0.5-2.5 (3) MG/3ML IN SOLN
3.0000 mL | Freq: Four times a day (QID) | RESPIRATORY_TRACT | Status: DC
  Administered 2022-11-28 – 2022-12-03 (×21): 3 mL via RESPIRATORY_TRACT
  Filled 2022-11-27 (×20): qty 3

## 2022-11-27 MED ORDER — ALBUTEROL SULFATE (2.5 MG/3ML) 0.083% IN NEBU
INHALATION_SOLUTION | RESPIRATORY_TRACT | Status: AC
  Administered 2022-11-27: 2.5 mg
  Filled 2022-11-27: qty 3

## 2022-11-27 MED ORDER — METHYLPREDNISOLONE SODIUM SUCC 125 MG IJ SOLR
125.0000 mg | Freq: Every day | INTRAMUSCULAR | Status: AC
  Administered 2022-11-28: 125 mg via INTRAVENOUS
  Filled 2022-11-27: qty 2

## 2022-11-27 MED ORDER — MOMETASONE FURO-FORMOTEROL FUM 200-5 MCG/ACT IN AERO
2.0000 | INHALATION_SPRAY | Freq: Two times a day (BID) | RESPIRATORY_TRACT | Status: DC
  Administered 2022-11-28 – 2022-12-03 (×9): 2 via RESPIRATORY_TRACT
  Filled 2022-11-27 (×2): qty 8.8

## 2022-11-27 MED ORDER — NICOTINE 21 MG/24HR TD PT24
21.0000 mg | MEDICATED_PATCH | Freq: Every day | TRANSDERMAL | Status: DC
  Administered 2022-11-28 – 2022-12-03 (×6): 21 mg via TRANSDERMAL
  Filled 2022-11-27 (×6): qty 1

## 2022-11-27 MED ORDER — GUAIFENESIN ER 600 MG PO TB12
600.0000 mg | ORAL_TABLET | Freq: Two times a day (BID) | ORAL | Status: DC
  Administered 2022-11-27 – 2022-12-03 (×11): 600 mg via ORAL
  Filled 2022-11-27 (×11): qty 1

## 2022-11-27 MED ORDER — ATORVASTATIN CALCIUM 40 MG PO TABS
40.0000 mg | ORAL_TABLET | Freq: Every day | ORAL | Status: DC
  Administered 2022-11-28 – 2022-12-03 (×6): 40 mg via ORAL
  Filled 2022-11-27 (×6): qty 1

## 2022-11-27 MED ORDER — PANTOPRAZOLE SODIUM 40 MG PO TBEC
40.0000 mg | DELAYED_RELEASE_TABLET | Freq: Two times a day (BID) | ORAL | Status: DC
  Administered 2022-11-27 – 2022-12-03 (×11): 40 mg via ORAL
  Filled 2022-11-27 (×11): qty 1

## 2022-11-27 MED ORDER — ASPIRIN 81 MG PO TBEC
81.0000 mg | DELAYED_RELEASE_TABLET | Freq: Every day | ORAL | Status: DC
  Administered 2022-11-27 – 2022-12-01 (×5): 81 mg via ORAL
  Filled 2022-11-27 (×6): qty 1

## 2022-11-27 MED ORDER — ACETAMINOPHEN 650 MG RE SUPP: 650.0000 mg | Freq: Four times a day (QID) | RECTAL | Status: DC | PRN

## 2022-11-27 MED ORDER — PREDNISONE 20 MG PO TABS
40.0000 mg | ORAL_TABLET | Freq: Every day | ORAL | Status: DC
  Administered 2022-11-29 – 2022-11-30 (×2): 40 mg via ORAL
  Filled 2022-11-27 (×2): qty 2

## 2022-11-27 MED ORDER — ACETAMINOPHEN 325 MG PO TABS: 650.0000 mg | ORAL_TABLET | Freq: Four times a day (QID) | ORAL | Status: DC | PRN

## 2022-11-27 MED ORDER — MORPHINE SULFATE (PF) 2 MG/ML IV SOLN: 2.0000 mg | INTRAVENOUS | Status: DC | PRN

## 2022-11-27 MED ORDER — CLOPIDOGREL BISULFATE 75 MG PO TABS
75.0000 mg | ORAL_TABLET | Freq: Every day | ORAL | Status: DC
  Administered 2022-11-28 – 2022-12-03 (×6): 75 mg via ORAL
  Filled 2022-11-27 (×6): qty 1

## 2022-11-27 MED ORDER — SODIUM CHLORIDE 0.9 % IV SOLN
2.0000 g | INTRAVENOUS | Status: DC
  Administered 2022-11-27: 2 g via INTRAVENOUS
  Filled 2022-11-27: qty 20

## 2022-11-27 MED ORDER — IPRATROPIUM-ALBUTEROL 0.5-2.5 (3) MG/3ML IN SOLN
RESPIRATORY_TRACT | Status: AC
  Administered 2022-11-27: 3 mL
  Filled 2022-11-27: qty 3

## 2022-11-27 MED ORDER — METHYLPREDNISOLONE SODIUM SUCC 125 MG IJ SOLR
125.0000 mg | Freq: Once | INTRAMUSCULAR | Status: AC
  Administered 2022-11-27: 125 mg via INTRAVENOUS
  Filled 2022-11-27: qty 2

## 2022-11-27 MED ORDER — OXYCODONE HCL 5 MG PO TABS: 5.0000 mg | ORAL_TABLET | ORAL | Status: DC | PRN

## 2022-11-27 MED ORDER — PRAVASTATIN SODIUM 40 MG PO TABS: 80.0000 mg | ORAL_TABLET | Freq: Every day | ORAL | Status: DC

## 2022-11-27 MED ORDER — MONTELUKAST SODIUM 10 MG PO TABS
10.0000 mg | ORAL_TABLET | Freq: Every day | ORAL | Status: DC
  Administered 2022-11-28 – 2022-11-30 (×3): 10 mg via ORAL
  Filled 2022-11-27 (×3): qty 1

## 2022-11-27 MED ORDER — NOREPINEPHRINE 4 MG/250ML-% IV SOLN
0.0000 ug/min | INTRAVENOUS | Status: DC
  Administered 2022-11-27: 2 ug/min via INTRAVENOUS
  Filled 2022-11-27: qty 250

## 2022-11-27 MED ORDER — LACTATED RINGERS IV SOLN: INTRAVENOUS | Status: AC

## 2022-11-27 MED ORDER — HEPARIN SODIUM (PORCINE) 5000 UNIT/ML IJ SOLN
5000.0000 [IU] | Freq: Three times a day (TID) | INTRAMUSCULAR | Status: DC
  Administered 2022-11-27 – 2022-12-03 (×13): 5000 [IU] via SUBCUTANEOUS
  Filled 2022-11-27 (×16): qty 1

## 2022-11-27 MED ORDER — ALBUTEROL SULFATE (2.5 MG/3ML) 0.083% IN NEBU
2.5000 mg | INHALATION_SOLUTION | RESPIRATORY_TRACT | Status: DC | PRN
  Filled 2022-11-27: qty 3

## 2022-11-27 NOTE — Sepsis Progress Note (Addendum)
Per chart review, antibiotics were given before blood cultures were collected. This was done in prior shift. Nothing noted about the rationale.

## 2022-11-27 NOTE — ED Notes (Signed)
ED TO INPATIENT HANDOFF REPORT  ED Nurse Name and Phone #: Penni Homans Name/Age/Gender Rande Lawman 77 y.o. male Room/Bed: APA09/APA09  Code Status   Code Status: Full Code  Home/SNF/Other Home Patient oriented to: self, place, time, and situation Is this baseline? Yes   Triage Complete: Triage complete  Chief Complaint Severe sepsis (HCC) [A41.9, R65.20]  Triage Note Two days ago began with SOB, significantly worse today. Wife drove him to the EMS. Did self-administer 3 Duo Nebs at home before going to EMS. Hx of PPD smoker.    Allergies No Active Allergies  Level of Care/Admitting Diagnosis ED Disposition     ED Disposition  Admit   Condition  --   Comment  Hospital Area: [facility] [[id number]]  Level of Care: ICU [6]  Covid Evaluation: Asymptomatic - no recent exposure (last 10 days) testing not required  Diagnosis: Severe sepsis [facility]) [[id number]]  Admitting Physician: Lilyan Gilford [[id number]]  Attending Physician: Lilyan Gilford [[id number]]  Certification:: I certify this patient will need inpatient services for at least 2 midnights  Expected Medical Readiness: 11/29/2022          B Medical/Surgery History Past Medical History:  Diagnosis Date   Allergic rhinitis    Asthma    COPD (chronic obstructive pulmonary disease) (HCC)    Hyperlipidemia    Hypertension    Past Surgical History:  Procedure Laterality Date   APPENDECTOMY       A IV Location/Drains/Wounds Patient Lines/Drains/Airways Status     Active Line/Drains/Airways     Name Placement date Placement time Site Days   Peripheral IV 11/27/22 20 G 1" Left Antecubital 11/27/22  1739  Antecubital  less than 1   Peripheral IV 11/27/22 18 G 1.16" Anterior;Right Forearm 11/27/22  1748  Forearm  less than 1   Peripheral IV 11/27/22 20 G 1" Anterior;Left Forearm 11/27/22  1905  Forearm  less than 1            Intake/Output Last 24 hours No intake or output data in the  24 hours ending 11/27/22 2028  Labs/Imaging Results for orders placed or performed during the hospital encounter of 11/27/22 (from the past 48 hour(s))  CBC with Differential     Status: Abnormal   Collection Time: 11/27/22  5:57 PM  Result Value Ref Range   WBC 15.7 (H) 4.0 - 10.5 K/uL   RBC 5.14 4.22 - 5.81 MIL/uL   Hemoglobin 15.5 13.0 - 17.0 g/dL   HCT 57.8 46.9 - 62.9 %   MCV 90.1 80.0 - 100.0 fL   MCH 30.2 26.0 - 34.0 pg   MCHC 33.5 30.0 - 36.0 g/dL   RDW 52.8 41.3 - 24.4 %   Platelets 413 (H) 150 - 400 K/uL   nRBC 0.0 0.0 - 0.2 %   Neutrophils Relative % 65 %   Neutro Abs 10.4 (H) 1.7 - 7.7 K/uL   Lymphocytes Relative 23 %   Lymphs Abs 3.7 0.7 - 4.0 K/uL   Monocytes Relative 8 %   Monocytes Absolute 1.2 (H) 0.1 - 1.0 K/uL   Eosinophils Relative 2 %   Eosinophils Absolute 0.3 0.0 - 0.5 K/uL   Basophils Relative 1 %   Basophils Absolute 0.1 0.0 - 0.1 K/uL   Immature Granulocytes 1 %   Abs Immature Granulocytes 0.08 (H) 0.00 - 0.07 K/uL    Comment: Performed at [facility], [address] [place], [place] [address]  Basic metabolic  panel     Status: Abnormal   Collection Time: 11/27/22  5:57 PM  Result Value Ref Range   Sodium 133 (L) 135 - 145 mmol/L   Potassium 4.1 3.5 - 5.1 mmol/L   Chloride 98 98 - 111 mmol/L   CO2 25 22 - 32 mmol/L   Glucose, Bld 167 (H) 70 - 99 mg/dL    Comment: Glucose reference range applies only to samples taken after fasting for at least 8 hours.   BUN 26 (H) 8 - 23 mg/dL   Creatinine, Ser 2.53 (H) 0.61 - 1.24 mg/dL   Calcium 8.0 (L) 8.9 - 10.3 mg/dL   GFR, Estimated 40 (L) >60 mL/min    Comment: (NOTE) Calculated using the CKD-EPI Creatinine Equation (2021)    Anion gap 10 5 - 15    Comment: Performed at [facility], [address], [place], [place] [address]  Troponin I (High Sensitivity)     Status: Abnormal   Collection Time: 11/27/22  5:57 PM  Result Value Ref Range   Troponin I (High Sensitivity) 74 (H) <18 ng/L    Comment:  (NOTE) Elevated high sensitivity troponin I (hsTnI) values and significant  changes across serial measurements may suggest ACS but many other  chronic and acute conditions are known to elevate hsTnI results.  Refer to the "Links" section for chest pain algorithms and additional  guidance. Performed at [facility], [address], [place], [place] [address]   Resp panel by RT-PCR (RSV, Flu A&B, Covid) Anterior Nasal Swab     Status: None   Collection Time: 11/27/22  6:02 PM   Specimen: Anterior Nasal Swab  Result Value Ref Range   SARS Coronavirus 2 by RT PCR NEGATIVE NEGATIVE    Comment: (NOTE) SARS-CoV-2 target nucleic acids are NOT DETECTED.  The SARS-CoV-2 RNA is generally detectable in upper respiratory specimens during the acute phase of infection. The lowest concentration of SARS-CoV-2 viral copies this assay can detect is 138 copies/mL. A negative result does not preclude SARS-Cov-2 infection and should not be used as the sole basis for treatment or other patient management decisions. A negative result may occur with  improper specimen collection/handling, submission of specimen other than nasopharyngeal swab, presence of viral mutation(s) within the areas targeted by this assay, and inadequate number of viral copies(<138 copies/mL). A negative result must be combined with clinical observations, patient history, and epidemiological information. The expected result is Negative.  Fact Sheet for Patients:  BloggerCourse.com  Fact Sheet for Healthcare Providers:  SeriousBroker.it  This test is no t yet approved or cleared by the [place] FDA and  has been authorized for detection and/or diagnosis of SARS-CoV-2 by FDA under an Emergency Use Authorization (EUA). This EUA will remain  in effect (meaning this test can be used) for the duration of the COVID-19 declaration under Section 564(b)(1) of the Act, 21 U.S.C.section  360bbb-3(b)(1), unless the authorization is terminated  or revoked sooner.       Influenza A by PCR NEGATIVE NEGATIVE   Influenza B by PCR NEGATIVE NEGATIVE    Comment: (NOTE) The Xpert Xpress SARS-CoV-2/FLU/RSV plus assay is intended as an aid in the diagnosis of influenza from Nasopharyngeal swab specimens and should not be used as a sole basis for treatment. Nasal washings and aspirates are unacceptable for Xpert Xpress SARS-CoV-2/FLU/RSV testing.  Fact Sheet for Patients: BloggerCourse.com  Fact Sheet for Healthcare Providers: SeriousBroker.it  This test is not yet approved or cleared by the [place]  FDA and has been authorized for detection and/or diagnosis of SARS-CoV-2 by FDA under an Emergency Use Authorization (EUA). This EUA will remain in effect (meaning this test can be used) for the duration of the COVID-19 declaration under Section 564(b)(1) of the Act, 21 U.S.C. section 360bbb-3(b)(1), unless the authorization is terminated or revoked.     Resp Syncytial Virus by PCR NEGATIVE NEGATIVE    Comment: (NOTE) Fact Sheet for Patients: BloggerCourse.com  Fact Sheet for Healthcare Providers: SeriousBroker.it  This test is not yet approved or cleared by the [place] FDA and has been authorized for detection and/or diagnosis of SARS-CoV-2 by FDA under an Emergency Use Authorization (EUA). This EUA will remain in effect (meaning this test can be used) for the duration of the COVID-19 declaration under Section 564(b)(1) of the Act, 21 U.S.C. section 360bbb-3(b)(1), unless the authorization is terminated or revoked.  Performed at [facility], [address] [place], [place] [address]   Culture, blood (single)     Status: None (Preliminary result)   Collection Time: 11/27/22  6:49 PM   Specimen: BLOOD  Result Value Ref Range   Specimen Description BLOOD  BOTTLES DRAWN AEROBIC AND ANAEROBIC    Special Requests      BLOOD RIGHT ARM Blood Culture results may not be optimal due to an excessive volume of blood received in culture bottles Performed at [facility], [address], [place], [place] [address]    Culture PENDING    Report Status PENDING   Lactic acid, plasma     Status: None   Collection Time: 11/27/22  6:49 PM  Result Value Ref Range   Lactic Acid, Venous 1.0 0.5 - 1.9 mmol/L    Comment: Performed at [facility], [address], [place], [place] [address]   DG Chest Portable 1 View  Result Date: 11/27/2022 CLINICAL DATA:  Shortness of Breath EXAM: PORTABLE CHEST - 1 VIEW COMPARISON:  04/07/2011. FINDINGS: Cardiac silhouette is prominent. There is pulmonary interstitial prominence with vascular congestion. Alveolar consolidation in the left base consistent with pneumonia and left-sided moderate pleural effusion. Small pleural effusion on the right. No pneumothorax. Aorta is calcified. There are thoracic degenerative changes. IMPRESSION: Findings suggest CHF. Left basilar consolidation and effusion consistent with pneumonia. Electronically Signed   By: Layla Maw M.D.   On: 11/27/2022 18:03    Pending Labs Unresulted Labs (From admission, onward)     Start     Ordered   11/27/22 1914  Lactic acid, plasma  (Lactic Acid)  STAT Now then every 3 hours,   R (with STAT occurrences)      11/27/22 1914   11/27/22 1914  Blood gas, venous  Once,   R        11/27/22 1914   Signed and Held  Legionella Pneumophila Serogp 1 Ur Ag  (COPD / Pneumonia / Cellulitis / Lower Extremity Wound)  Once,   R        Signed and Held   Signed and Held  Strep pneumoniae urinary antigen  (COPD / Pneumonia / Cellulitis / Lower Extremity Wound)  Once,   R        Signed and Held   Signed and Held  Expectorated Sputum Assessment w Gram Stain, Rflx to Resp Cult  (COPD / Pneumonia / Cellulitis / Lower Extremity Wound)  Once,   R        Signed and Held   Signed  and Held  Comprehensive metabolic panel  Tomorrow morning,  R        Signed and Held   Signed and Held  Magnesium  Tomorrow morning,   R        Signed and Held   Signed and Held  CBC with Differential/Platelet  Tomorrow morning,   R        Signed and Held   Signed and Held  Procalcitonin  Add-on,   R       References:    Procalcitonin Lower Respiratory Tract Infection AND Sepsis Procalcitonin Algorithm   Signed and Held            Vitals/Pain Today's Vitals   11/27/22 1909 11/27/22 1930 11/27/22 1945 11/27/22 2014  BP: (!) 89/68 103/83 103/81   Pulse: 92 94 93   Resp: (!) 27 (!) 22 (!) 33   Temp:      TempSrc:      SpO2: 93% 95% 96% 97%  Weight:      Height:    5\' 6"  (1.676 m)    Isolation Precautions No active isolations  Medications Medications  lactated ringers infusion (0 mLs Intravenous Hold 11/27/22 1938)  cefTRIAXone (ROCEPHIN) 2 g in sodium chloride 0.9 % 100 mL IVPB (0 g Intravenous Stopped 11/27/22 1910)  azithromycin (ZITHROMAX) 500 mg in sodium chloride 0.9 % 250 mL IVPB (500 mg Intravenous New Bag/Given 11/27/22 1840)  norepinephrine (LEVOPHED) 4mg  in (0.016 mg/mL) premix infusion (2 mcg/min Intravenous New Bag/Given 11/27/22 1849)  ipratropium-albuterol (DUONEB) 0.5-2.5 (3) MG/3ML nebulizer solution (3 mLs  Given 11/27/22 1738)  albuterol (PROVENTIL) (2.5 MG/3ML) 0.083% nebulizer solution (2.5 mg  Given 11/27/22 1738)  methylPREDNISolone sodium succinate (SOLU-MEDROL) 125 mg/2 mL injection 125 mg (125 mg Intravenous Given 11/27/22 1808)  magnesium sulfate IVPB 2 g 50 mL (0 g Intravenous Stopped 11/27/22 1937)  furosemide (LASIX) injection 40 mg (40 mg Intravenous Given 11/27/22 1940)  sodium chloride 0.9 % bolus 500 mL (500 mLs Intravenous New Bag/Given 11/27/22 1910)  ipratropium-albuterol (DUONEB) 0.5-2.5 (3) MG/3ML nebulizer solution (3 mLs  Given 11/27/22 2013)  albuterol (PROVENTIL) (2.5 MG/3ML) 0.083% nebulizer solution (2.5 mg  Given 11/27/22 2013)     Mobility Walks at baseline. Dyspnea with exertion on hospital arrival     Focused Assessments Pulmonary Assessment Handoff:  Lung sounds: Bilateral Breath Sounds: Expiratory wheezes L Breath Sounds: Diminished, Expiratory wheezes, Inspiratory wheezes R Breath Sounds: Diminished, Expiratory wheezes, Inspiratory wheezes O2 Device: Bi-PAP      R Recommendations: See Admitting Provider Note  Report given to:   Additional Notes: Family c/o shob worsening over last couple days. Hx of COPD. Used several home duonebs with no improvement. Wife called 911 but ended up driving to paramedic station for transport.

## 2022-11-27 NOTE — ED Provider Notes (Signed)
[place] [facility] DEPARTMENT AT [facility] Provider Note   CSN: [id number] Arrival date & time: 11/27/22  1715     History  Chief Complaint  Patient presents with   Shortness of Breath    JEANNE GAUNTLETT is a 77 y.o. male.  Patient is a 77 year old male with past medical history of hypertension, hyperlipidemia, COPD, and active smoker presenting in respiratory distress.  EMS was called to the house for shortness of breath.  Patient admits to new cough, shortness of breath, and chest tightness x 2 days.  He was reported to have taken his albuterol inhaler with no improvement at the house.  He was found to be hypoxic in the 80s on EMS arrival.  He received 2 breathing treatments in route.  Respiratory therapy administered 5mg  Albuterol and 0.5mg  of Atrovent trough the NIV.  Denies fevers or chills.  Patient full code.  The history is provided by the patient. No language interpreter was used.  Shortness of Breath Associated symptoms: cough and wheezing   Associated symptoms: no abdominal pain, no chest pain, no ear pain, no fever, no rash, no sore throat and no vomiting        Home Medications Prior to Admission medications   Medication Sig Start Date End Date Taking? Authorizing Provider  albuterol (PROVENTIL HFA;VENTOLIN HFA) 108 (90 Base) MCG/ACT inhaler Inhale 2 puffs into the lungs every 4 (four) hours as needed for wheezing or shortness of breath.     [provider]  aspirin EC 81 MG tablet Take 1 tablet by mouth at bedtime.     [provider]  benazepril (LOTENSIN) 40 MG tablet Take 40 mg by mouth at bedtime.     [provider]  clopidogrel (PLAVIX) 75 MG tablet Take 1 tablet (75 mg total) by mouth daily. 08/23/17   Enedina Finner, MD  Fluticasone-Salmeterol (ADVAIR) 250-50 MCG/DOSE AEPB Inhale 1 puff into the lungs 2 (two) times daily.    [provider]  loratadine (CLARITIN) 10 MG tablet Take 10 mg by mouth at bedtime.      [provider]  pantoprazole (PROTONIX) 40 MG tablet Take 1 tablet by mouth 2 (two) times daily. 05/18/17   [provider]  pravastatin (PRAVACHOL) 80 MG tablet Take 80 mg by mouth at bedtime.     [provider]  sucralfate (CARAFATE) 1 g tablet Take 1 g by mouth 2 (two) times daily.     [provider]  terazosin (HYTRIN) 5 MG capsule Take 5 mg by mouth at bedtime.    [provider]      Allergies    Patient has no known allergies.    Review of Systems   Review of Systems  Constitutional:  Negative for chills and fever.  HENT:  Negative for ear pain and sore throat.   Eyes:  Negative for pain and visual disturbance.  Respiratory:  Positive for cough, chest tightness, shortness of breath and wheezing.   Cardiovascular:  Negative for chest pain and palpitations.  Gastrointestinal:  Negative for abdominal pain and vomiting.  Genitourinary:  Negative for dysuria and hematuria.  Musculoskeletal:  Negative for arthralgias and back pain.  Skin:  Negative for color change and rash.  Neurological:  Negative for seizures and syncope.  All other systems reviewed and are negative.   Physical Exam Updated Vital Signs BP (!) 130/106   Pulse (!) 118   Temp 97.6 F (36.4 C) (Axillary)   Resp (!) 26  Ht 5\' 6"  (1.676 m)   Wt 74.8 kg   SpO2 96%   BMI 26.63 kg/m  Physical Exam Vitals and nursing note reviewed.  Constitutional:      General: He is not in acute distress.    Appearance: He is well-developed.  HENT:     Head: Normocephalic and atraumatic.  Eyes:     Conjunctiva/sclera: Conjunctivae normal.  Cardiovascular:     Rate and Rhythm: Normal rate and regular rhythm.     Heart sounds: No murmur heard. Pulmonary:     Effort: Tachypnea, accessory muscle usage and respiratory distress present.     Breath sounds: Decreased breath sounds and wheezing present.  Abdominal:     Palpations: Abdomen is soft.     Tenderness: There is no  abdominal tenderness.  Musculoskeletal:        General: No swelling.     Cervical back: Neck supple.  Skin:    General: Skin is warm and dry.     Capillary Refill: Capillary refill takes less than 2 seconds.  Neurological:     Mental Status: He is alert.  Psychiatric:        Mood and Affect: Mood normal.     ED Results / Procedures / Treatments   Labs (all labs ordered are listed, but only abnormal results are displayed) Labs Reviewed  CBC WITH DIFFERENTIAL/PLATELET - Abnormal; Notable for the following components:      Result Value   WBC 15.7 (*)    Platelets 413 (*)    Neutro Abs 10.4 (*)    Monocytes Absolute 1.2 (*)    Abs Immature Granulocytes 0.08 (*)    All other components within normal limits  BASIC METABOLIC PANEL - Abnormal; Notable for the following components:   Sodium 133 (*)    Glucose, Bld 167 (*)    BUN 26 (*)    Creatinine, Ser 1.74 (*)    Calcium 8.0 (*)    GFR, Estimated 40 (*)    All other components within normal limits  RESP PANEL BY RT-PCR (RSV, FLU A&B, COVID)  [id number]  CULTURE, BLOOD (SINGLE)    EKG None  Radiology DG Chest Portable 1 View  Result Date: 11/27/2022 CLINICAL DATA:  Shortness of Breath EXAM: PORTABLE CHEST - 1 VIEW COMPARISON:  04/07/2011. FINDINGS: Cardiac silhouette is prominent. There is pulmonary interstitial prominence with vascular congestion. Alveolar consolidation in the left base consistent with pneumonia and left-sided moderate pleural effusion. Small pleural effusion on the right. No pneumothorax. Aorta is calcified. There are thoracic degenerative changes. IMPRESSION: Findings suggest CHF. Left basilar consolidation and effusion consistent with pneumonia. Electronically Signed   By: Layla Maw M.D.   On: 11/27/2022 18:03    Procedures .Critical Care  Performed by: Franne Forts, DO Authorized by: Franne Forts, DO   Critical care provider statement:    Critical care time (minutes):  101   Critical care was  necessary to treat or prevent imminent or life-threatening deterioration of the following conditions:  Respiratory failure   Critical care was time spent personally by me on the following activities:  Development of treatment plan with patient or surrogate, discussions with consultants, evaluation of patient's response to treatment, examination of patient, ordering and review of laboratory studies, ordering and review of radiographic studies, ordering and performing treatments and interventions, pulse oximetry, re-evaluation of patient's condition and review of old charts   Care discussed with: admitting provider       Medications Ordered  in ED Medications  magnesium sulfate IVPB 2 g 50 mL (2 g Intravenous New Bag/Given 11/27/22 1811)  lactated ringers infusion (has no administration in time range)  cefTRIAXone (ROCEPHIN) 2 g in sodium chloride 0.9 % 100 mL IVPB (has no administration in time range)  azithromycin (ZITHROMAX) 500 mg in sodium chloride 0.9 % 250 mL IVPB (has no administration in time range)  furosemide (LASIX) injection 40 mg (has no administration in time range)  ipratropium-albuterol (DUONEB) 0.5-2.5 (3) MG/3ML nebulizer solution (3 mLs  Given 11/27/22 1738)  albuterol (PROVENTIL) (2.5 MG/3ML) 0.083% nebulizer solution (2.5 mg  Given 11/27/22 1738)  methylPREDNISolone sodium succinate (SOLU-MEDROL) 125 mg/2 mL injection 125 mg (125 mg Intravenous Given 11/27/22 1808)    ED Course/ Medical Decision Making/ A&P                                 Medical Decision Making Amount and/or Complexity of Data Reviewed Labs: ordered. Radiology: ordered.  Risk Prescription drug management. Decision regarding hospitalization.   29:60 PM  77 year old male with past medical history of hypertension, hyperlipidemia, COPD, and active smoker presenting in respiratory distress.  Patient was found hypoxic by EMS.  Please read HPI for further details.  My exam patient has tachypnea,  tachycardia, accessory muscle use, wheezing, and diminished breath sounds.  Solu-Medrol 125 given.  Will repeat albuterol.  Patient qualifying for sepsis protocol with tachycardia, tachypnea, and leukocytosis of 15.7.  Blood cultures and lactic acid sent.  Chest x-ray suggestive of left basilar consolidation with effusion consistent with pneumonia.  Patient given Rocephin and azithromycin.  Patient also has findings suggestive of CHF.  Will hold off on fluid boluses at this time.  No hypotension.  Blood pressure 130/106.  Will order maintenance fluid at low rate of 100 mL/h.  Lasix 40 mg IV given.  Patient recommended for admission at this time for acute respiratory failure with hypoxia secondary to COPD exacerbation from community-acquired pneumonia.  I spoke with hospitalist who agrees to accept the patient.        Final Clinical Impression(s) / ED Diagnoses Final diagnoses:  COPD exacerbation (HCC)  Acute respiratory failure with hypoxia (HCC)  Sepsis, due to unspecified organism, unspecified whether acute organ dysfunction present [facility])  Community acquired pneumonia, unspecified laterality  Chronic congestive heart failure, unspecified heart failure type (HCC)  AKI (acute kidney injury) [facility])    Rx / DC Orders ED Discharge Orders     None         Franne Forts, DO 11/27/22 2049

## 2022-11-27 NOTE — Plan of Care (Signed)
  Problem: Education: Goal: Knowledge of General Education information will improve Description: Including pain rating scale, medication(s)/side effects and non-pharmacologic comfort measures Outcome: Progressing   Problem: Health Behavior/Discharge Planning: Goal: Ability to manage health-related needs will improve Outcome: Progressing   Problem: Clinical Measurements: Goal: Ability to maintain clinical measurements within normal limits will improve Outcome: Progressing Goal: Will remain free from infection Outcome: Progressing Goal: Diagnostic test results will improve Outcome: Progressing Goal: Respiratory complications will improve Outcome: Progressing Goal: Cardiovascular complication will be avoided Outcome: Progressing   Problem: Activity: Goal: Risk for activity intolerance will decrease Outcome: Progressing   Problem: Nutrition: Goal: Adequate nutrition will be maintained Outcome: Progressing   Problem: Coping: Goal: Level of anxiety will decrease Outcome: Progressing   Problem: Elimination: Goal: Will not experience complications related to bowel motility Outcome: Progressing Goal: Will not experience complications related to urinary retention Outcome: Progressing   Problem: Pain Managment: Goal: General experience of comfort will improve Outcome: Progressing   Problem: Safety: Goal: Ability to remain free from injury will improve Outcome: Progressing   Problem: Education: Goal: Knowledge of disease or condition will improve Outcome: Progressing Goal: Knowledge of the prescribed therapeutic regimen will improve Outcome: Progressing Goal: Individualized Educational Video(s) Outcome: Progressing   Problem: Activity: Goal: Ability to tolerate increased activity will improve Outcome: Progressing Goal: Will verbalize the importance of balancing activity with adequate rest periods Outcome: Progressing   Problem: Respiratory: Goal: Ability to maintain  a clear airway will improve Outcome: Progressing Goal: Levels of oxygenation will improve Outcome: Progressing Goal: Ability to maintain adequate ventilation will improve Outcome: Progressing   Problem: Activity: Goal: Ability to tolerate increased activity will improve Outcome: Progressing   Problem: Clinical Measurements: Goal: Ability to maintain a body temperature in the normal range will improve Outcome: Progressing   Problem: Respiratory: Goal: Ability to maintain adequate ventilation will improve Outcome: Progressing Goal: Ability to maintain a clear airway will improve Outcome: Progressing

## 2022-11-27 NOTE — Progress Notes (Signed)
eLink Physician-Brief Progress Note Patient Name: FINNEGAN CARKHUFF DOB: 1946-01-11 MRN: [id number]   Date of Service  11/27/2022  HPI/Events of Note  77/M with history of COPD, who presents with shortness of breath. He was assessed with COPD exacerbation, pneumonia and has been started on bronchodilators, steroids, antibiotics.  He had required bipap and levophed in the ED and was subsequently admitted to the ICU  eICU Interventions  Acute on chronic respiratory failure secondary to COPD exacerbation Pneumonia, community acquired - Will continue scheduled bronchodilators - Agree with short course of systemic steroids - Had been on BIPAP support earlier, but now weaned to [facility]. Will continue to monitor respiratory status closely.  - Started on empiric antibiotics for  pneumonia - Will follow cultures, deescalate as warranted.  - Given initial bolus. Started on levophed infusion - Will titrate levophed to maintain MAP >65 - Trend WBC, lactate, temperature curve.           Brenae Lasecki M DELA CRUZ 11/27/2022, 9:57 PM

## 2022-11-27 NOTE — ED Triage Notes (Signed)
Two days ago began with SOB, significantly worse today. Wife drove him to the EMS. Did self-administer 3 Duo Nebs at home before going to EMS. Hx of PPD smoker.

## 2022-11-27 NOTE — Progress Notes (Signed)
Transported  to ICU on BiPAP ,Patient appears improved taken off BiPAP placed on 4 lpm / [place].

## 2022-11-27 NOTE — Sepsis Progress Note (Signed)
Elink following code sepsis °

## 2022-11-27 NOTE — ED Notes (Signed)
Patient placed on NIV immediately. Patient was in acute respiratory Distress. Patient was given 2 nebs in route and I administered 5mg  Albuterol and 0.5mg  of Atrovent trough the NIV. NIV settings include. PS of 10, Peep of 5, back up RR 10, FIO2 of 40%. VTe 525, Ve 18.9, RR 39 total, leak of 34, PIP 10. Patient is wearing a Medium mask and is tolerating somewhat well. Alarms are set appropriately.

## 2022-11-27 NOTE — ED Notes (Signed)
Per MD Pt does not need the foley. Pt will use urinal to measure I/O

## 2022-11-28 ENCOUNTER — Other Ambulatory Visit: Payer: Self-pay

## 2022-11-28 ENCOUNTER — Inpatient Hospital Stay (HOSPITAL_COMMUNITY): Payer: Medicare HMO

## 2022-11-28 DIAGNOSIS — A419 Sepsis, unspecified organism: Secondary | ICD-10-CM | POA: Diagnosis not present

## 2022-11-28 DIAGNOSIS — R6520 Severe sepsis without septic shock: Secondary | ICD-10-CM | POA: Diagnosis not present

## 2022-11-28 DIAGNOSIS — F17200 Nicotine dependence, unspecified, uncomplicated: Secondary | ICD-10-CM | POA: Insufficient documentation

## 2022-11-28 DIAGNOSIS — I10 Essential (primary) hypertension: Secondary | ICD-10-CM | POA: Insufficient documentation

## 2022-11-28 DIAGNOSIS — R7989 Other specified abnormal findings of blood chemistry: Secondary | ICD-10-CM | POA: Insufficient documentation

## 2022-11-28 DIAGNOSIS — I509 Heart failure, unspecified: Secondary | ICD-10-CM

## 2022-11-28 DIAGNOSIS — N179 Acute kidney failure, unspecified: Secondary | ICD-10-CM | POA: Insufficient documentation

## 2022-11-28 DIAGNOSIS — J449 Chronic obstructive pulmonary disease, unspecified: Secondary | ICD-10-CM | POA: Insufficient documentation

## 2022-11-28 DIAGNOSIS — E785 Hyperlipidemia, unspecified: Secondary | ICD-10-CM | POA: Insufficient documentation

## 2022-11-28 DIAGNOSIS — J9601 Acute respiratory failure with hypoxia: Secondary | ICD-10-CM | POA: Insufficient documentation

## 2022-11-28 LAB — BLOOD GAS, VENOUS
Acid-Base Excess: 1.3 mmol/L (ref 0.0–2.0)
Bicarbonate: 25.9 mmol/L (ref 20.0–28.0)
Drawn by: 7049
O2 Saturation: 97.1 %
Patient temperature: 36.9
pCO2, Ven: 40 mmHg — ABNORMAL LOW (ref 44–60)
pH, Ven: 7.42 (ref 7.25–7.43)
pO2, Ven: 78 mmHg — ABNORMAL HIGH (ref 32–45)

## 2022-11-28 LAB — CBC WITH DIFFERENTIAL/PLATELET
Abs Immature Granulocytes: 0.02 10*3/uL (ref 0.00–0.07)
Basophils Absolute: 0 10*3/uL (ref 0.0–0.1)
Basophils Relative: 0 %
Eosinophils Absolute: 0 10*3/uL (ref 0.0–0.5)
Eosinophils Relative: 0 %
HCT: 41.4 % (ref 39.0–52.0)
Hemoglobin: 13.9 g/dL (ref 13.0–17.0)
Immature Granulocytes: 0 %
Lymphocytes Relative: 6 %
Lymphs Abs: 0.4 10*3/uL — ABNORMAL LOW (ref 0.7–4.0)
MCH: 29.6 pg (ref 26.0–34.0)
MCHC: 33.6 g/dL (ref 30.0–36.0)
MCV: 88.1 fL (ref 80.0–100.0)
Monocytes Absolute: 0.1 10*3/uL (ref 0.1–1.0)
Monocytes Relative: 2 %
Neutro Abs: 5.6 10*3/uL (ref 1.7–7.7)
Neutrophils Relative %: 92 %
Platelets: 275 10*3/uL (ref 150–400)
RBC: 4.7 MIL/uL (ref 4.22–5.81)
RDW: 12.3 % (ref 11.5–15.5)
WBC: 6.1 10*3/uL (ref 4.0–10.5)
nRBC: 0 % (ref 0.0–0.2)

## 2022-11-28 LAB — COMPREHENSIVE METABOLIC PANEL
ALT: 25 U/L (ref 0–44)
AST: 31 U/L (ref 15–41)
Albumin: 2.9 g/dL — ABNORMAL LOW (ref 3.5–5.0)
Alkaline Phosphatase: 54 U/L (ref 38–126)
Anion gap: 9 (ref 5–15)
BUN: 30 mg/dL — ABNORMAL HIGH (ref 8–23)
CO2: 22 mmol/L (ref 22–32)
Calcium: 7.7 mg/dL — ABNORMAL LOW (ref 8.9–10.3)
Chloride: 101 mmol/L (ref 98–111)
Creatinine, Ser: 1.74 mg/dL — ABNORMAL HIGH (ref 0.61–1.24)
GFR, Estimated: 40 mL/min — ABNORMAL LOW (ref >60)
Glucose, Bld: 156 mg/dL — ABNORMAL HIGH (ref 70–99)
Potassium: 4.2 mmol/L (ref 3.5–5.1)
Sodium: 132 mmol/L — ABNORMAL LOW (ref 135–145)
Total Bilirubin: 0.6 mg/dL (ref 0.3–1.2)
Total Protein: 5.9 g/dL — ABNORMAL LOW (ref 6.5–8.1)

## 2022-11-28 LAB — MAGNESIUM: Magnesium: 1.8 mg/dL (ref 1.7–2.4)

## 2022-11-28 LAB — TROPONIN I (HIGH SENSITIVITY)
Troponin I (High Sensitivity): 183 ng/L (ref <18)
Troponin I (High Sensitivity): 149 ng/L (ref <18)

## 2022-11-28 LAB — MRSA NEXT GEN BY PCR, NASAL: MRSA by PCR Next Gen: NOT DETECTED

## 2022-11-28 LAB — STREP PNEUMONIAE URINARY ANTIGEN: Strep Pneumo Urinary Antigen: NEGATIVE

## 2022-11-28 MED ORDER — METHOCARBAMOL 500 MG PO TABS
500.0000 mg | ORAL_TABLET | Freq: Three times a day (TID) | ORAL | Status: DC | PRN
  Administered 2022-11-28: 500 mg via ORAL
  Filled 2022-11-28: qty 1

## 2022-11-28 MED ORDER — LACTATED RINGERS IV SOLN: INTRAVENOUS | Status: DC

## 2022-11-28 MED ORDER — AZITHROMYCIN 250 MG PO TABS
500.0000 mg | ORAL_TABLET | Freq: Every day | ORAL | Status: DC
  Administered 2022-11-28 – 2022-12-01 (×4): 500 mg via ORAL
  Filled 2022-11-28 (×4): qty 2

## 2022-11-28 MED ORDER — CHLORHEXIDINE GLUCONATE CLOTH 2 % EX PADS
6.0000 | MEDICATED_PAD | Freq: Every day | CUTANEOUS | Status: DC
  Administered 2022-11-28 – 2022-11-29 (×2): 6 via TOPICAL

## 2022-11-28 MED ORDER — MORPHINE SULFATE (PF) 2 MG/ML IV SOLN: 1.0000 mg | INTRAVENOUS | Status: DC | PRN

## 2022-11-28 NOTE — Assessment & Plan Note (Addendum)
-   Creatinine at baseline 1.19 - Creatinine today 1.74 Lab Results  Component Value Date   CREATININE 1.74 (H) 11/28/2022   CREATININE 1.74 (H) 11/27/2022   CREATININE 1.19 08/22/2017    - Likely due to hypovolemia - Holding benazepril and hydrochlorothiazide - Continue IV hydration - Trending

## 2022-11-28 NOTE — Assessment & Plan Note (Addendum)
Hypotensive - Holding Norvasc, benazepril, hydrochlorothiazide in the setting of - hypotension>>>  requiring Levophed -11/29/2022 discontinuing Levophed, starting midodrine -11/30/2022 - blood pressure stabilized, will discontinue midodrine

## 2022-11-28 NOTE — Progress Notes (Signed)
PROGRESS NOTE    Patient: Tommy Knight                            PCP: Erasmo Downer, NP                    DOB: Dec 31, 1945            DOA: 11/27/2022 ZOX:[id number]             DOS: 11/28/2022, 11:41 AM   LOS: 1 day   Date of Service: The patient was seen and examined on 11/28/2022  Subjective:   The patient was seen and examined this morning, awake alert, cooperative Blood pressure 99/75 on Levophed Respiratory rate 27, on 3 L of oxygen, satting 93%  Brief Narrative:   MARQUEE WAVRA is a 77 y.o. male with medical history significant of CVA, CHF, tobacco use disorder, hyperlipidemia, GERD, COPD, and more presents the ED with a chief complaint of dyspnea.  Patient reports that he had dyspnea that started 2-3 days ago.  He reports he wears no oxygen at baseline.  His dyspnea has been worse since it started.  He has nebulizer at home.  He uses it in the a.m. and the p.m.  He reports it has not been helping at all with his dyspnea.  He did use it 1 extra time but is still did not help.  He has had a cough productive of yellow sputum.  He has had subjective fever and chills with diaphoresis.  Patient has had a decrease in appetite as well, but no nausea, vomiting, abdominal pain.  Patient denies any chest pain.  Patient has no other complaints at this time.   Patient does smoke a pack per day.  He is willing to use a nicotine patch to try to quit.  He drinks alcohol every day.  He said 1-3 beers per day.  He reports is not more than that.  Discussed with RN to watch for signs of withdrawal.  ED:  Temp 97.6, heart rate 118, respiratory rate 26, blood pressure 130/106, satting normally on 4 L nasal cannula Leukocytosis at 15.7, hemoglobin 15.5, platelets 413 Chemistry reveals a slight hyponatremia 133, elevated creatinine at 1.74 Negative COVID, flu, RSV Chest x-ray shows CHF and left basilar effusion with pneumonia EKG shows sinus rhythm with a rate in the 90s BPs were soft in the ED.   It was discussed with PCCM and due to patient's history of CHF full fluid bolus was not given.  A 500 mL bolus was given patient was reassessed without signs of fluid overload so he started on IV maintenance fluids Patient was given Zithromax, Rocephin, Lasix, mag, Solu-Medrol, Levophed in the ED Admission requested due to sepsis    Assessment & Plan:   Principal Problem:   Severe sepsis (HCC) Active Problems:   Tobacco use disorder   AKI (acute kidney injury) (HCC)   Acute respiratory failure with hypoxia (HCC)   CHF (congestive heart failure) (HCC)   Hyperlipidemia   Essential hypertension   Elevated troponin   COPD (chronic obstructive pulmonary disease) (HCC)     Assessment and Plan: * Severe sepsis (HCC) - POA: Met sepsis criteria with Tachycardic, Tachypneic, leukocytosis, hypotensive and acute respiratory failure - Chest x-ray shows left basilar pneumonia - BP was soft on Levophed,  (Avoided bolus fluid due to history of CHF)  - PCCM was consulted by ED and recommended  500 mL bolus at the time of call, reassess patient and if not showing signs of fluid overload, continue IV fluids - LR running at 100 mL/h - Lactic acid 1.4 - Procalcitonin is undetectable, so possibly infection is viral in nature - Flu and COVID-negative - Will de-escalate from IV antibiotics of azithromycin and Rocephin-to just p.o. azithromycin - Monitoring closely in ICU  COPD (chronic obstructive pulmonary disease) (HCC) - Currently on 3 L of oxygen, satting 93% ABG    Component Value Date/Time   HCO3 25.9 11/28/2022 0443   ACIDBASEDEF 2.0 11/27/2022 1947   O2SAT 97.1 11/28/2022 0443   - Continue Advair, Singulair - Continue Steroids - Continue as needed albuterol and scheduled DuoNeb   Elevated troponin Troponin 74>> 184, 149 - Likely due to demand ischemia given hypotension, hypoxia and SIRS - Denies of having any chest pain -No EKG changes - Monitor on telemetry  Essential  hypertension - Holding Norvasc, benazepril, hydrochlorothiazide in the setting of - hypotension>>>  requiring Levophed  Hyperlipidemia - Continue statin  CHF (congestive heart failure) (HCC) - History of CHF  Last echo 2019 ED JF 50-55%, grade 1 diastolic dysfunction, moderate aortic valve calcification,  repeating 2D echocardiogram  - Chest x-ray shows signs of CHF - Given that patient appeared clinically hypovolemic, and had an AKI fluids were started in the ED as per PCCM advice to ED physician - Continue gentle hydration - Repeat chest x-ray in the a.m. to assess for changes  Acute respiratory failure with hypoxia (HCC)  - On admission required up to 4 L of oxygen currently on 3 L of oxygen, satting 93% - No oxygen at baseline - Chest x-ray shows CHF and left basilar pneumonia - Patient also has a history of COPD - Troponin elevated at 74 >> 183, 149 No changes in EKG  - Procalcitonin is undetectable - COVID flu RSV negative - Weaning off O2 as tolerated >>> maintaining O2 sat > 92% - Continue nebs - Continue IV Solu-Medrol - Continue to monitor  AKI (acute kidney injury) (HCC) - Creatinine at baseline 1.19 - Creatinine today 1.74 Lab Results  Component Value Date   CREATININE 1.74 (H) 11/28/2022   CREATININE 1.74 (H) 11/27/2022   CREATININE 1.19 08/22/2017    - Likely due to hypovolemia - Holding benazepril and hydrochlorothiazide - Continue IV hydration - Trending   Tobacco use disorder - Smokes a pack per day - Counseled on importance of cessation - Nicotine patch started    ----------------------------------------------------------------------------------------------------------------------------------------------- Nutritional status:  The patient's BMI is: Body mass index is 26.97 kg/m. I agree with the assessment and plan as outlined  Cultures; Blood Cultures x 2 >> NGT Urine Culture  >>>  Sputum Culture >>     ------------------------------------------------------------------------------------------------------------------------------------------------  DVT prophylaxis:  heparin injection 5,000 Units Start: 11/27/22 2215 SCDs Start: 11/27/22 2129   Code Status:   Code Status: Full Code  Family Communication: Daughter present at bedside-updated -Advance care planning has been discussed.   Admission status:   Status is: Inpatient Remains inpatient appropriate because: Needing ICU admission, IV pressors for hypotension, IV fluids, breathing treatments, respiratory support   Disposition: From  - home             Planning for discharge in 1-2 days Procedures:   No admission procedures for hospital encounter.   Antimicrobials:  Anti-infectives (From admission, onward)    Start     Dose/Rate Route Frequency Ordered Stop   11/28/22 1215  azithromycin (ZITHROMAX) tablet 500  mg        500 mg Oral Daily 11/28/22 1129     11/27/22 1830  cefTRIAXone (ROCEPHIN) 2 g in sodium chloride 0.9 % 100 mL IVPB  Status:  Discontinued        2 g 200 mL/hr over 30 Minutes Intravenous Every 24 hours 11/27/22 1826 11/28/22 1129   11/27/22 1830  azithromycin (ZITHROMAX) 500 mg in sodium chloride 0.9 % 250 mL IVPB  Status:  Discontinued        500 mg 250 mL/hr over 60 Minutes Intravenous Every 24 hours 11/27/22 1826 11/28/22 1129        Medication:   aspirin EC  81 mg Oral QHS   atorvastatin  40 mg Oral Daily   azithromycin  500 mg Oral Daily   Chlorhexidine Gluconate Cloth  6 each Topical Daily   clopidogrel  75 mg Oral Daily   guaiFENesin  600 mg Oral BID   heparin  5,000 Units Subcutaneous Q8H   ipratropium-albuterol  3 mL Nebulization Q6H   mometasone-formoterol  2 puff Inhalation BID   montelukast  10 mg Oral Daily   nicotine  21 mg Transdermal Daily   pantoprazole  40 mg Oral BID   [START ON 11/29/2022] predniSONE  40 mg Oral Q breakfast    acetaminophen **OR** acetaminophen,  albuterol, morphine injection, oxyCODONE   Objective:   Vitals:   11/28/22 0800 11/28/22 0811 11/28/22 0831 11/28/22 1130  BP: 99/75     Pulse: 92     Resp: (!) 27     Temp:  97.8 F (36.6 C)  98 F (36.7 C)  TempSrc:  Oral  Oral  SpO2: 94%  93%   Weight:      Height:        Intake/Output Summary (Last 24 hours) at 11/28/2022 1141 Last data filed at 11/28/2022 1100 Gross per 24 hour  Intake 1113.71 ml  Output 1150 ml  Net -36.29 ml   Filed Weights   11/27/22 1741 11/27/22 2130 11/28/22 0532  Weight: 74.8 kg 87.7 kg 87.7 kg     Physical examination:   Constitution:  SOB. but Awake, Alert, cooperative,  Psychiatric:   Normal and stable mood and affect, cognition intact,   HEENT:       O2 via nasal cannula normocephalic, PERRL, otherwise with in Normal limits  Chest:         Chest symmetric Cardio vascular:  S1/S2, RRR, No murmure, No Rubs or Gallops  pulmonary: Clear to auscultation bilaterally, respirations unlabored, diffuse rhonchi, mild wheezing, no crackles at the bases Abdomen: Soft, non-tender, non-distended, bowel sounds,no masses, no organomegaly Muscular skeletal: Limited exam - in bed, able to move all 4 extremities,   Neuro: CNII-XII intact. , normal motor and sensation, reflexes intact  Extremities: No pitting edema lower extremities, +2 pulses  Skin: Dry, warm to touch, negative for any Rashes, No open wounds Wounds: per nursing documentation   ------------------------------------------------------------------------------------------------------------------------------------------    LABs:     Latest Ref Rng & Units 11/28/2022    4:43 AM 11/27/2022    5:57 PM 08/23/2017    4:38 AM  CBC  WBC 4.0 - 10.5 K/uL 6.1  15.7  7.1   Hemoglobin 13.0 - 17.0 g/dL 13.2  44.0  10.2   Hematocrit 39.0 - 52.0 % 41.4  46.3  39.9   Platelets 150 - 400 K/uL 275  413  296       Latest Ref Rng & Units 11/28/2022  4:43 AM 11/27/2022    5:57 PM 08/22/2017   11:24 AM   CMP  Glucose 70 - 99 mg/dL 841  324  401   BUN 8 - 23 mg/dL 30  26  21    Creatinine 0.61 - 1.24 mg/dL 0.27  2.53  6.64   Sodium 135 - 145 mmol/L 132  133  135   Potassium 3.5 - 5.1 mmol/L 4.2  4.1  5.3   Chloride 98 - 111 mmol/L 101  98  101   CO2 22 - 32 mmol/L 22  25  25    Calcium 8.9 - 10.3 mg/dL 7.7  8.0  9.0   Total Protein 6.5 - 8.1 g/dL 5.9   7.5   Total Bilirubin 0.3 - 1.2 mg/dL 0.6   1.0   Alkaline Phos 38 - 126 U/L 54   68   AST 15 - 41 U/L 31   25   ALT 0 - 44 U/L 25   23        Micro Results Recent Results (from the past 240 hour(s))  Resp panel by RT-PCR (RSV, Flu A&B, Covid) Anterior Nasal Swab     Status: None   Collection Time: 11/27/22  6:02 PM   Specimen: Anterior Nasal Swab  Result Value Ref Range Status   SARS Coronavirus 2 by RT PCR NEGATIVE NEGATIVE Final    Comment: (NOTE) SARS-CoV-2 target nucleic acids are NOT DETECTED.  The SARS-CoV-2 RNA is generally detectable in upper respiratory specimens during the acute phase of infection. The lowest concentration of SARS-CoV-2 viral copies this assay can detect is 138 copies/mL. A negative result does not preclude SARS-Cov-2 infection and should not be used as the sole basis for treatment or other patient management decisions. A negative result may occur with  improper specimen collection/handling, submission of specimen other than nasopharyngeal swab, presence of viral mutation(s) within the areas targeted by this assay, and inadequate number of viral copies(<138 copies/mL). A negative result must be combined with clinical observations, patient history, and epidemiological information. The expected result is Negative.  Fact Sheet for Patients:  BloggerCourse.com  Fact Sheet for Healthcare Providers:  SeriousBroker.it  This test is no t yet approved or cleared by the [place] FDA and  has been authorized for detection and/or diagnosis of  SARS-CoV-2 by FDA under an Emergency Use Authorization (EUA). This EUA will remain  in effect (meaning this test can be used) for the duration of the COVID-19 declaration under Section 564(b)(1) of the Act, 21 U.S.C.section 360bbb-3(b)(1), unless the authorization is terminated  or revoked sooner.       Influenza A by PCR NEGATIVE NEGATIVE Final   Influenza B by PCR NEGATIVE NEGATIVE Final    Comment: (NOTE) The Xpert Xpress SARS-CoV-2/FLU/RSV plus assay is intended as an aid in the diagnosis of influenza from Nasopharyngeal swab specimens and should not be used as a sole basis for treatment. Nasal washings and aspirates are unacceptable for Xpert Xpress SARS-CoV-2/FLU/RSV testing.  Fact Sheet for Patients: BloggerCourse.com  Fact Sheet for Healthcare Providers: SeriousBroker.it  This test is not yet approved or cleared by the [place] FDA and has been authorized for detection and/or diagnosis of SARS-CoV-2 by FDA under an Emergency Use Authorization (EUA). This EUA will remain in effect (meaning this test can be used) for the duration of the COVID-19 declaration under Section 564(b)(1) of the Act, 21 U.S.C. section 360bbb-3(b)(1), unless the authorization is terminated or revoked.     Resp Syncytial Virus  by PCR NEGATIVE NEGATIVE Final    Comment: (NOTE) Fact Sheet for Patients: BloggerCourse.com  Fact Sheet for Healthcare Providers: SeriousBroker.it  This test is not yet approved or cleared by the [place] FDA and has been authorized for detection and/or diagnosis of SARS-CoV-2 by FDA under an Emergency Use Authorization (EUA). This EUA will remain in effect (meaning this test can be used) for the duration of the COVID-19 declaration under Section 564(b)(1) of the Act, 21 U.S.C. section 360bbb-3(b)(1), unless the authorization is terminated  or revoked.  Performed at [facility], [address], [place], [place] [address]   Culture, blood (single)     Status: None (Preliminary result)   Collection Time: 11/27/22  6:49 PM   Specimen: BLOOD  Result Value Ref Range Status   Specimen Description BLOOD BOTTLES DRAWN AEROBIC AND ANAEROBIC  Final   Special Requests   Final    BLOOD RIGHT ARM Blood Culture results may not be optimal due to an excessive volume of blood received in culture bottles   Culture   Final    NO GROWTH < 12 HOURS Performed at [facility], [address], [place], [place] [address]    Report Status PENDING  Incomplete  MRSA Next Gen by PCR, Nasal     Status: None   Collection Time: 11/27/22  9:28 PM   Specimen: Nasal Mucosa; Nasal Swab  Result Value Ref Range Status   MRSA by PCR Next Gen NOT DETECTED NOT DETECTED Final    Comment: (NOTE) The GeneXpert MRSA Assay (FDA approved for NASAL specimens only), is one component of a comprehensive MRSA colonization surveillance program. It is not intended to diagnose MRSA infection nor to guide or monitor treatment for MRSA infections. Test performance is not FDA approved in patients less than 32 years old. Performed at [facility], [address] [place], [place] [address]     Radiology Reports DG CHEST PORT 1 VIEW  Result Date: 11/28/2022 CLINICAL DATA:  Shortness of breath, concern for sepsis. EXAM: PORTABLE CHEST 1 VIEW COMPARISON:  Chest radiograph dated 11/27/2022. FINDINGS: The heart is borderline enlarged. Vascular calcifications are seen in the aortic arch. Mild left basilar atelectasis/airspace disease appears decreased compared to prior exam. Mild diffuse bilateral interstitial opacities appear decreased. No significant pleural effusion or pneumothorax on either side. Degenerative changes are seen in the spine. IMPRESSION: Decreased mild left basilar atelectasis/airspace disease and mild diffuse bilateral interstitial opacities. Electronically Signed    By: Romona Curls M.D.   On: 11/28/2022 09:21   DG Chest Portable 1 View  Result Date: 11/27/2022 CLINICAL DATA:  Shortness of Breath EXAM: PORTABLE CHEST - 1 VIEW COMPARISON:  04/07/2011. FINDINGS: Cardiac silhouette is prominent. There is pulmonary interstitial prominence with vascular congestion. Alveolar consolidation in the left base consistent with pneumonia and left-sided moderate pleural effusion. Small pleural effusion on the right. No pneumothorax. Aorta is calcified. There are thoracic degenerative changes. IMPRESSION: Findings suggest CHF. Left basilar consolidation and effusion consistent with pneumonia. Electronically Signed   By: Layla Maw M.D.   On: 11/27/2022 18:03    SIGNED: Kendell Bane, MD, FHM. FAAFP. [facility] - [facility] Critical care time spent - 55 min.  In seeing, evaluating and examining the patient. Reviewing medical records, labs, drawn plan of care. [facility],  Pager (please use [email or web address] to page/ text) Please use Epic Secure Chat for non-urgent communication (7AM-7PM)  If 7PM-7AM, please contact night-coverage [email or web address], 11/28/2022, 11:41 AM

## 2022-11-28 NOTE — TOC CM/SW Note (Signed)
Transition of Care [facility]) - Inpatient Brief Assessment   Patient Details  Name: Tommy Knight MRN: [id number] Date of Birth: [birth year]  Transition of Care [facility]) CM/SW Contact:    Villa Herb, LCSWA Phone Number: 11/28/2022, 10:48 AM   Clinical Narrative: Transition of Care Department [facility]) has reviewed patient and no TOC needs have been identified at this time. We will continue to monitor patient advancement through interdisciplinary progression rounds. If new patient transition needs arise, please place a TOC consult.  Transition of Care Asessment: Insurance and Status: Insurance coverage has been reviewed Patient has primary care physician: Yes Home environment has been reviewed: From home Prior level of function:: independent Prior/Current Home Services: No current home services Social Determinants of Health Reivew: SDOH reviewed no interventions necessary Readmission risk has been reviewed: Yes Transition of care needs: no transition of care needs at this time

## 2022-11-28 NOTE — Plan of Care (Signed)

## 2022-11-28 NOTE — Assessment & Plan Note (Signed)
-   Smokes a pack per day - Counseled on importance of cessation - Nicotine patch started

## 2022-11-28 NOTE — Assessment & Plan Note (Addendum)
Improved sepsis criteria with exception of hypertension  - POA: Met sepsis criteria with Tachycardic, Tachypneic, leukocytosis, hypotensive and acute respiratory failure - Chest x-ray shows left basilar pneumonia - BP was soft: on Levophed--discontinue   - PCCM was consulted by ED and recommended 500 mL bolus at the time of call, reassess patient and if not showing signs of fluid overload, continue IV fluids - LR running at 100 mL/h--will be discontinued - Lactic acid 1.4 - Procalcitonin is undetectable, so possibly infection is viral in nature - Flu and COVID-negative - Will de-escalate from IV antibiotics of azithromycin and Rocephin-to just p.o. azithromycin - Monitoring closely in ICU

## 2022-11-28 NOTE — Assessment & Plan Note (Signed)
Continue statin. 

## 2022-11-28 NOTE — Hospital Course (Addendum)
Tommy Knight is a 77 y.o. male with medical history significant of CVA, CHF, tobacco use disorder, hyperlipidemia, GERD, COPD, and more presents the ED with a chief complaint of dyspnea.  Patient reports that he had dyspnea that started 2-3 days ago.  He reports he wears no oxygen at baseline.  His dyspnea has been worse since it started.  He has nebulizer at home.  He uses it in the a.m. and the p.m.  He reports it has not been helping at all with his dyspnea.  He did use it 1 extra time but is still did not help.  He has had a cough productive of yellow sputum.  He has had subjective fever and chills with diaphoresis.  Patient has had a decrease in appetite as well, but no nausea, vomiting, abdominal pain.  Patient denies any chest pain.  Patient has no other complaints at this time.   Patient does smoke a pack per day.  He is willing to use a nicotine patch to try to quit.  He drinks alcohol every day.  He said 1-3 beers per day.  He reports is not more than that.  Discussed with RN to watch for signs of withdrawal.  ED:  Temp 97.6, heart rate 118, respiratory rate 26, blood pressure 130/106, satting normally on 4 L nasal cannula Leukocytosis at 15.7, hemoglobin 15.5, platelets 413 Chemistry reveals a slight hyponatremia 133, elevated creatinine at 1.74 Negative COVID, flu, RSV Chest x-ray shows CHF and left basilar effusion with pneumonia EKG shows sinus rhythm with a rate in the 90s BPs were soft in the ED.  It was discussed with PCCM and due to patient's history of CHF full fluid bolus was not given.  A 500 mL bolus was given patient was reassessed without signs of fluid overload so he started on IV maintenance fluids Patient was given Zithromax, Rocephin, Lasix, mag, Solu-Medrol, Levophed in the ED Admission requested due to sepsis

## 2022-11-28 NOTE — Assessment & Plan Note (Addendum)
-   Resolved weaned off 3 L of oxygen, currently on room air satting 92% ABG    Component Value Date/Time   HCO3 25.9 11/28/2022 0443   ACIDBASEDEF 2.0 11/27/2022 1947   O2SAT 97.1 11/28/2022 0443   - Continue Advair, Singulair - Continue Steroids -will taper down - Continue as needed albuterol and scheduled DuoNeb

## 2022-11-28 NOTE — Assessment & Plan Note (Addendum)
Troponin 74>> 184, 149 - Likely due to demand ischemia given hypotension, hypoxia and SIRS - Denies of having any chest pain -No EKG changes - Monitor on telemetry

## 2022-11-28 NOTE — Assessment & Plan Note (Addendum)
-   On admission required up to 4 L of oxygen currently on 3 L of oxygen, satting 93% - No oxygen at baseline - Chest x-ray shows CHF and left basilar pneumonia - Patient also has a history of COPD - Troponin elevated at 74 >> 183, 149 No changes in EKG  - Procalcitonin is undetectable - COVID flu RSV negative - Weaning off O2 as tolerated >>> maintaining O2 sat > 92% - Continue nebs - Continue IV Solu-Medrol - Continue to monitor

## 2022-11-28 NOTE — H&P (Signed)
History and Physical    Patient: Tommy Knight [id number]:[id number] DOB: 17-Oct-1945 DOA: 11/27/2022 DOS: the patient was seen and examined on 11/28/2022 PCP: Erasmo Downer, NP  Patient coming from: Home  Chief Complaint:  Chief Complaint  Patient presents with   Shortness of Breath   HPI: Tommy Knight is a 77 y.o. male with medical history significant of CVA, CHF, tobacco use disorder, hyperlipidemia, GERD, COPD, and more presents the ED with a chief complaint of dyspnea.  Patient reports that he had dyspnea that started 2-3 days ago.  He reports he wears no oxygen at baseline.  His dyspnea has been worse since it started.  He has nebulizer at home.  He uses it in the a.m. and the p.m.  He reports it has not been helping at all with his dyspnea.  He did use it 1 extra time but is still did not help.  He has had a cough productive of yellow sputum.  He has had subjective fever and chills with diaphoresis.  Patient has had a decrease in appetite as well, but no nausea, vomiting, abdominal pain.  Patient denies any chest pain.  Patient has no other complaints at this time.  Patient does smoke a pack per day.  He is willing to use a nicotine patch to try to quit.  He drinks alcohol every day.  He said 1-3 beers per day.  He reports is not more than that.  Discussed with RN to watch for signs of withdrawal. Review of Systems: As mentioned in the history of present illness. All other systems reviewed and are negative. Past Medical History:  Diagnosis Date   Allergic rhinitis    Asthma    COPD (chronic obstructive pulmonary disease) (HCC)    Hyperlipidemia    Hypertension    Past Surgical History:  Procedure Laterality Date   APPENDECTOMY     Social History:  reports that he has been smoking. He has never used smokeless tobacco. He reports current alcohol use. No history on file for drug use.  No Active Allergies  History reviewed. No pertinent family history.  Prior to Admission  medications   Medication Sig Start Date End Date Taking? Authorizing Provider  albuterol (PROVENTIL HFA;VENTOLIN HFA) 108 (90 Base) MCG/ACT inhaler Inhale 2 puffs into the lungs every 4 (four) hours as needed for wheezing or shortness of breath.    Yes [provider]  atorvastatin (LIPITOR) 40 MG tablet Take 40 mg by mouth daily.   Yes [provider]  clopidogrel (PLAVIX) 75 MG tablet Take 1 tablet (75 mg total) by mouth daily. 08/23/17  Yes Enedina Finner, MD  Fluticasone-Salmeterol (ADVAIR) 250-50 MCG/DOSE AEPB Inhale 1 puff into the lungs 2 (two) times daily.   Yes [provider]  ipratropium-albuterol (DUONEB) 0.5-2.5 (3) MG/3ML SOLN Take 3 mLs by nebulization every 6 (six) hours as needed (for shortness of breath).   Yes [provider]  levocetirizine (XYZAL) 5 MG tablet Take 5 mg by mouth daily as needed for allergies. 03/25/21  Yes [provider]  montelukast (SINGULAIR) 10 MG tablet Take 10 mg by mouth daily.   Yes [provider]  omeprazole (PRILOSEC) 20 MG capsule Take 20 mg by mouth daily.   Yes [provider]    Physical Exam: Vitals:   11/27/22 2339 11/28/22 0000 11/28/22 0053 11/28/22 0100  BP:  90/71 96/74 102/76  Pulse: 82 78 81 80  Resp: 20 (!) 22 17 (!) 21  Temp: 98.4 F (36.9 C)     TempSrc: Oral     SpO2:  93% 94% 95%  Weight:      Height:       1.  General: Patient lying supine in bed,  no acute distress   2. Psychiatric: Alert and oriented x 3, mood and behavior normal for situation, pleasant and cooperative with exam   3. Neurologic: Speech and language are normal, face is symmetric, moves all 4 extremities voluntarily, at baseline without acute deficits on limited exam   4. HEENMT:  Head is atraumatic, normocephalic, pupils reactive to light, neck is supple, trachea is midline, mucous membranes are moist   5. Respiratory : Diminished in the lower lung fields otherwise lungs are clear to  auscultation bilaterally without wheezing, rhonchi, rales, no cyanosis, no increase in work of breathing or accessory muscle use   6. Cardiovascular : Heart rate normal, rhythm is regular, murmur present, rubs or gallops, no peripheral edema, peripheral pulses palpated   7. Gastrointestinal:  Abdomen is soft, nondistended, nontender to palpation bowel sounds active, no masses or organomegaly palpated   8. Skin:  Skin is warm, dry and intact without rashes, acute lesions, or ulcers on limited exam   9.Musculoskeletal:  No acute deformities or trauma, no asymmetry in tone, no peripheral edema, peripheral pulses palpated, no tenderness to palpation in the extremities  Data Reviewed: In the ED Temp 97.6, heart rate 118, respiratory rate 26, blood pressure 130/106, satting normally on 4 L nasal cannula Leukocytosis at 15.7, hemoglobin 15.5, platelets 413 Chemistry reveals a slight hyponatremia 133, elevated creatinine at 1.74 Negative COVID, flu, RSV Chest x-ray shows CHF and left basilar effusion with pneumonia EKG shows sinus rhythm with a rate in the 90s BPs were soft in the ED.  It was discussed with PCCM and due to patient's history of CHF full fluid bolus was not given.  A 500 mL bolus was given patient was reassessed without signs of fluid overload so he started on IV maintenance fluids Patient was given Zithromax, Rocephin, Lasix, mag, Solu-Medrol, Levophed in the ED Admission requested due to sepsis   Assessment and Plan: * Severe sepsis (HCC) - Tachycardic, tachypneic, leukocytosis - Respiratory failure - Chest x-ray shows left basilar pneumonia - BP was soft in the ED and patient was started on Levophed, but full fluid bolus was not done due to patient's history of CHF - PCCM was consulted by ED and recommended 500 mL bolus at the time of call, reassess patient and if not showing signs of fluid overload, continue IV fluids - LR running at 100 mL/h - Lactic acid 1.4 -  Procalcitonin is undetectable, so possibly infection is viral in nature - Flu and COVID-negative - Consider holding further antibiotics - Repeat chest x-ray to assess for changes under current treatment plan  COPD (chronic obstructive pulmonary disease) (HCC) - pH 7.31 - pCO2 50 - Continue Advair, Singulair - Continue steroids - Continue as needed albuterol and scheduled DuoNeb - Trend VBG in the a.m.  Elevated troponin Troponin 74 - Likely due to demand ischemia given hypotension, hypoxia and SIRS - Trend with a.m. labs - Monitor on telemetry  Essential hypertension - Holding Norvasc, benazepril, hydrochlorothiazide in the setting of hypotension requiring Levophed  Hyperlipidemia - Continue statin  CHF (congestive heart failure) (HCC) - History of CHF - Chest x-ray shows signs of CHF - Given that patient appeared clinically hypovolemic, and had an AKI fluids were started in the ED  as per PCCM advice to ED physician - Continue gentle hydration - Repeat chest x-ray in the a.m. to assess for changes  Acute respiratory failure with hypoxia (HCC) - No oxygen at baseline - Requiring 4 L nasal cannula here - Chest x-ray shows CHF and left basilar pneumonia - Patient also has a history of COPD - Troponin elevated at 74 - Procalcitonin is undetectable - COVID flu RSV negative - Wean off O2 as tolerated - Breathing treatments as needed - Continue Solu-Medrol - Continue to monitor  AKI (acute kidney injury) (HCC) - Creatinine at baseline 1.19 - Creatinine today 1.74 - Likely due to hypovolemia - Holding benazepril and hydrochlorothiazide - Continue IV hydration - Trend in the a.m.  Tobacco use disorder - Smokes a pack per day - Counseled on importance of cessation - Nicotine patch started      Advance Care Planning:   Code Status: Full Code  Consults:  None at this time Family Communication: No family at bedside  Severity of Illness: The appropriate patient  status for this patient is INPATIENT. Inpatient status is judged to be reasonable and necessary in order to provide the required intensity of service to ensure the patient's safety. The patient's presenting symptoms, physical exam findings, and initial radiographic and laboratory data in the context of their chronic comorbidities is felt to place them at high risk for further clinical deterioration. Furthermore, it is not anticipated that the patient will be medically stable for discharge from the hospital within 2 midnights of admission.   * I certify that at the point of admission it is my clinical judgment that the patient will require inpatient hospital care spanning beyond 2 midnights from the point of admission due to high intensity of service, high risk for further deterioration and high frequency of surveillance required.*  Author: Lilyan Gilford, DO 11/28/2022 3:11 AM  For on call review [email or web address].

## 2022-11-28 NOTE — Assessment & Plan Note (Addendum)
-   New onset progressive CHF -Repeat echocardiogram revealing reduced ED JF 25-30%, global hypokinesis, reduced LV function   Last echo 2019 ED JF 50-55%, grade 1 diastolic dysfunction, moderate aortic valve calcification,  repeating 2D echocardiogram: -Chest x-ray consistent with CHF findings  -Consult cardiology-recommending continue Lasix, recommending transfer to [facility] for right/LHC to further evaluate -Discontinuing midodrine -BP has stabilized Theology has discussed the plan with patient they are agreeable for transfer for cardiac cath   Intake/Output Summary (Last 24 hours) at 11/30/2022 1318 Last data filed at 11/30/2022 1100 Gross per 24 hour  Intake 720 ml  Output 4125 ml  Net -3405 ml   Filed Weights   11/27/22 2130 11/28/22 0532 11/30/22 0408  Weight: 87.7 kg 87.7 kg 85.2 kg   -Continue IV Lasix, continue monitoring I's and O's and daily weight, RedsCLIp

## 2022-11-29 ENCOUNTER — Other Ambulatory Visit (HOSPITAL_COMMUNITY): Payer: Self-pay | Admitting: *Deleted

## 2022-11-29 ENCOUNTER — Encounter (HOSPITAL_COMMUNITY): Payer: Self-pay | Admitting: Family Medicine

## 2022-11-29 ENCOUNTER — Inpatient Hospital Stay (HOSPITAL_COMMUNITY): Payer: Medicare HMO

## 2022-11-29 DIAGNOSIS — I771 Stricture of artery: Secondary | ICD-10-CM

## 2022-11-29 DIAGNOSIS — A419 Sepsis, unspecified organism: Secondary | ICD-10-CM | POA: Diagnosis not present

## 2022-11-29 DIAGNOSIS — I6523 Occlusion and stenosis of bilateral carotid arteries: Secondary | ICD-10-CM

## 2022-11-29 DIAGNOSIS — I5A Non-ischemic myocardial injury (non-traumatic): Secondary | ICD-10-CM

## 2022-11-29 DIAGNOSIS — R6520 Severe sepsis without septic shock: Secondary | ICD-10-CM | POA: Diagnosis not present

## 2022-11-29 DIAGNOSIS — I339 Acute and subacute endocarditis, unspecified: Secondary | ICD-10-CM | POA: Diagnosis not present

## 2022-11-29 DIAGNOSIS — I509 Heart failure, unspecified: Secondary | ICD-10-CM | POA: Diagnosis not present

## 2022-11-29 LAB — ECHOCARDIOGRAM COMPLETE
AR max vel: 0.67 cm2
AV Area VTI: 0.66 cm2
AV Area mean vel: 0.67 cm2
AV Mean grad: 28 mmHg
AV Peak grad: 45.9 mmHg
Ao pk vel: 3.39 m/s
Area-P 1/2: 5.54 cm2
Calc EF: 36.4 %
Height: 71 in
MV M vel: 5.43 m/s
MV Peak grad: 117.9 mmHg
S' Lateral: 4.5 cm
Single Plane A2C EF: 32.4 %
Single Plane A4C EF: 40.3 %
Weight: 3093.49 [oz_av]

## 2022-11-29 LAB — BRAIN NATRIURETIC PEPTIDE: B Natriuretic Peptide: 2458 pg/mL — ABNORMAL HIGH (ref 0.0–100.0)

## 2022-11-29 LAB — LEGIONELLA PNEUMOPHILA SEROGP 1 UR AG: L. pneumophila Serogp 1 Ur Ag: NEGATIVE

## 2022-11-29 MED ORDER — FUROSEMIDE 10 MG/ML IJ SOLN
40.0000 mg | Freq: Two times a day (BID) | INTRAMUSCULAR | Status: DC
  Administered 2022-11-29 – 2022-11-30 (×3): 40 mg via INTRAVENOUS
  Filled 2022-11-29 (×3): qty 4

## 2022-11-29 MED ORDER — MIDODRINE HCL 5 MG PO TABS
5.0000 mg | ORAL_TABLET | Freq: Three times a day (TID) | ORAL | Status: DC
  Administered 2022-11-29 – 2022-11-30 (×4): 5 mg via ORAL
  Filled 2022-11-29 (×4): qty 1

## 2022-11-29 MED ORDER — SODIUM CHLORIDE 0.9 % IV SOLN: INTRAVENOUS | Status: DC

## 2022-11-29 NOTE — Consult Note (Addendum)
Cardiology Consultation   Patient ID: Tommy Knight MRN: [id number]; DOB: 1945/06/21  Admit date: 11/27/2022 Date of Consult: 11/29/2022  PCP:  Tommy Downer, NP   [facility] Providers Cardiologist: New to [facility] Click here to update MD or APP on Care Team, Refresh:1}     Patient Profile:   Tommy Knight is a 77 y.o. male with a hx of COPD, HTN, CVA 2019, HTN, HLD, habitual ETOH intake (2-3 beers per day, sometimes more), tobacco abuse, GERD, carotid artery disease, suspected L subclavian stenosis by CT 2019, mildly dilated aortic root by echo 2019, hyperkalemia who is being seen 11/29/2022 for the evaluation of possible CHF at the request of Dr. Flossie Dibble.  History of Present Illness:   Mr. Goldfine has no known cardiac history. Remote carotid duplex 2014 showed 50-69% RICA and <50% LICA. He had a stroke in 2019 with echo showing EF 50-55%, G1DD, mildly dilated aortic root, mild MR, negative bubble study. CT angio at that time showed extensive calcified plaque through proximal L subclavian artery felt likely occluded vs severely stenotic with distal reconstitution, occluded L vertebral, 55% prox RICA, 50% prox LICA, aortic atherosclerosis. He was treated with ASA + Plavix to begin with, with plan for Plavix monotherapy long term.  He presented to the hospital with worsening shortness of breath for the last 3 days. He also developed orthopnea requiring him to sleep in a recliner for 2 days. He tried nebulizer at home with no relief. He has also been coughing, initially with brown sputum that progressed to more clear. He's had some chest discomfort isolated to the coughing. No edema. Labs notable for hsToponins 74->183->149, BNP 2458, Cr 1.74 (previously 1.19 in 2019), calcium 7.7 (correcting to 8.6 for albumin of 2.9), hyponatremia of 132. Covid/flu/pro-cal neg. CXR showed findings of possible L basilar PNA as well as possible CHF. He has been treated with antibiotics,  supplemental O2 (not on prior to admission), and pressors - required Levophed + IV fluids then transitioned to midodrine. Also got a dose of IV Lasix on admission 9/14. He reports excellent UOP. Nurse confirmed BPs have been checked on the right arm.  He smokes cigarettes. He drinks daily but states it depends who shows up to the shop - usually 2-3 beers per day, sometimes more.  He states "I think I've probably drunk a dumptruck full of alcohol in my lifetime."  Past Medical History:  Diagnosis Date   Allergic rhinitis    Asthma    Carotid artery disease (HCC)    COPD (chronic obstructive pulmonary disease) (HCC)    Hyperlipidemia    Hypertension    Stenosis of left subclavian artery (HCC)    Stroke [facility]     Past Surgical History:  Procedure Laterality Date   APPENDECTOMY       Home Medications:  Prior to Admission medications   Medication Sig Start Date End Date Taking? Authorizing Provider  albuterol (PROVENTIL HFA;VENTOLIN HFA) 108 (90 Base) MCG/ACT inhaler Inhale 2 puffs into the lungs every 4 (four) hours as needed for wheezing or shortness of breath.    Yes [provider]  atorvastatin (LIPITOR) 40 MG tablet Take 40 mg by mouth every evening.   Yes [provider]  clopidogrel (PLAVIX) 75 MG tablet Take 1 tablet (75 mg total) by mouth daily. 08/23/17  Yes Enedina Finner, MD  Fluticasone-Salmeterol (ADVAIR) 250-50 MCG/DOSE AEPB Inhale 1 puff into the lungs 2 (two) times daily.   Yes [provider]  ipratropium-albuterol (DUONEB) 0.5-2.5 (3) MG/3ML SOLN Take 3 mLs by nebulization every 6 (six) hours as needed (for shortness of breath).   Yes [provider]  levocetirizine (XYZAL) 5 MG tablet Take 5 mg by mouth every evening.   Yes [provider]  montelukast (SINGULAIR) 10 MG tablet Take 10 mg by mouth every evening.   Yes [provider]  Multiple Vitamins-Minerals (CENTRUM SILVER 50+MEN) TABS Take 1 tablet by mouth daily.    Yes [provider]  Multiple Vitamins-Minerals (PRESERVISION AREDS 2) CAPS Take 1 capsule by mouth 2 (two) times daily.   Yes [provider]  omeprazole (PRILOSEC) 20 MG capsule Take 20 mg by mouth daily.   Yes [provider]    Inpatient Medications: Scheduled Meds:  aspirin EC  81 mg Oral QHS   atorvastatin  40 mg Oral Daily   azithromycin  500 mg Oral Daily   Chlorhexidine Gluconate Cloth  6 each Topical Daily   clopidogrel  75 mg Oral Daily   guaiFENesin  600 mg Oral BID   heparin  5,000 Units Subcutaneous Q8H   ipratropium-albuterol  3 mL Nebulization Q6H   midodrine  5 mg Oral TID WC   mometasone-formoterol  2 puff Inhalation BID   montelukast  10 mg Oral Daily   nicotine  21 mg Transdermal Daily   pantoprazole  40 mg Oral BID   predniSONE  40 mg Oral Q breakfast   Continuous Infusions:  sodium chloride 50 mL/hr at 11/29/22 0834   PRN Meds: acetaminophen **OR** acetaminophen, albuterol, methocarbamol, morphine injection, oxyCODONE  Allergies:   No Known Allergies  Social History:   Social History   Socioeconomic History   Marital status: Married    Spouse name: Not on file   Number of children: Not on file   Years of education: Not on file   Highest education level: Not on file  Occupational History   Not on file  Tobacco Use   Smoking status: Every Day   Smokeless tobacco: Never  Substance and Sexual Activity   Alcohol use: Yes    Comment: 1-3 beers per day   Drug use: Not on file   Sexual activity: Not on file  Other Topics Concern   Not on file  Social History Narrative   Not on file   Social Determinants of Health   Financial Resource Strain: Not on file  Food Insecurity: No Food Insecurity (11/27/2022)   Hunger Vital Sign    Worried About Running Out of Food in the Last Year: Never true    Ran Out of Food in the Last Year: Never true  Transportation Needs: No Transportation Needs (11/27/2022)   PRAPARE -  Administrator, Civil Service (Medical): No    Lack of Transportation (Non-Medical): No  Physical Activity: Not on file  Stress: Not on file  Social Connections: Not on file  Intimate Partner Violence: Not At Risk (11/27/2022)   Humiliation, Afraid, Rape, and Kick questionnaire    Fear of Current or Ex-Partner: No    Emotionally Abused: No    Physically Abused: No    Sexually Abused: No    Family History:    Family History  Problem Relation Age of Onset   Heart disease Other        multiple family members he's not sure     ROS:  Please see the history of present illness.   All other ROS reviewed and negative.  Physical Exam/Data:   Vitals:   11/29/22 0330 11/29/22 0400 11/29/22 0430 11/29/22 0735  BP:  113/76    Pulse: 80 81 84   Resp: 19 (!) 22 20   Temp:   98.5 F (36.9 C) 98.1 F (36.7 C)  TempSrc:   Oral Oral  SpO2: 96% 97% 96%   Weight:      Height:        Intake/Output Summary (Last 24 hours) at 11/29/2022 1009 Last data filed at 11/29/2022 0804 Gross per 24 hour  Intake 2099.89 ml  Output 1275 ml  Net 824.89 ml      11/28/2022    5:32 AM 11/27/2022    9:30 PM 11/27/2022    5:41 PM  Last 3 Weights  Weight (lbs) 193 lb 5.5 oz 193 lb 5.5 oz 165 lb  Weight (kg) 87.7 kg 87.7 kg 74.844 kg     Body mass index is 26.97 kg/m.  General: Well developed, well nourished, in no acute distress. Head: Normocephalic, atraumatic, sclera non-icteric, no xanthomas, nares are without discharge. Neck: JVP not elevated. Lungs: Bilateral wheezing, bibasilar rales. Breathing is unlabored. Heart: RRR S1 S2 without murmurs, rubs, or gallops.  Abdomen: Soft, non-tender, non-distended with normoactive bowel sounds. No rebound/guarding. Extremities: No clubbing or cyanosis. No edema. Distal pedal pulses are 2+ and equal bilaterally. Neuro: Alert and oriented X 3. Moves all extremities spontaneously. Psych:  Responds to questions appropriately with a normal  affect.   EKG:  The EKG was personally reviewed and demonstrates:  NSR 94bpm with suspected LVH with secondary repolarization abnormality Telemetry:  Telemetry was personally reviewed and demonstrates:  NSR  Relevant CV Studies: N/A  Laboratory Data:  High Sensitivity Troponin:   Recent Labs  Lab 11/27/22 1757 11/28/22 0443 11/28/22 0723  TROPONINIHS 74* 183* 149*     Chemistry Recent Labs  Lab 11/27/22 1757 11/28/22 0443  NA 133* 132*  K 4.1 4.2  CL 98 101  CO2 25 22  GLUCOSE 167* 156*  BUN 26* 30*  CREATININE 1.74* 1.74*  CALCIUM 8.0* 7.7*  MG  --  1.8  GFRNONAA 40* 40*  ANIONGAP 10 9    Recent Labs  Lab 11/28/22 0443  PROT 5.9*  ALBUMIN 2.9*  AST 31  ALT 25  ALKPHOS 54  BILITOT 0.6   Lipids No results for input(s): "CHOL", "TRIG", "HDL", "LABVLDL", "LDLCALC", "CHOLHDL" in the last 168 hours.  Hematology Recent Labs  Lab 11/27/22 1757 11/28/22 0443  WBC 15.7* 6.1  RBC 5.14 4.70  HGB 15.5 13.9  HCT 46.3 41.4  MCV 90.1 88.1  MCH 30.2 29.6  MCHC 33.5 33.6  RDW 12.5 12.3  PLT 413* 275   Thyroid No results for input(s): "TSH", "FREET4" in the last 168 hours.  BNP Recent Labs  Lab 11/29/22 0440  BNP 2,458.0*    DDimer No results for input(s): "DDIMER" in the last 168 hours.   Radiology/Studies:  DG CHEST PORT 1 VIEW  Result Date: 11/28/2022 CLINICAL DATA:  Shortness of breath, concern for sepsis. EXAM: PORTABLE CHEST 1 VIEW COMPARISON:  Chest radiograph dated 11/27/2022. FINDINGS: The heart is borderline enlarged. Vascular calcifications are seen in the aortic arch. Mild left basilar atelectasis/airspace disease appears decreased compared to prior exam. Mild diffuse bilateral interstitial opacities appear decreased. No significant pleural effusion or pneumothorax on either side. Degenerative changes are seen in the spine. IMPRESSION: Decreased mild left basilar atelectasis/airspace disease and mild diffuse bilateral interstitial opacities.  Electronically Signed   By:  Romona Curls M.D.   On: 11/28/2022 09:21   DG Chest Portable 1 View  Result Date: 11/27/2022 CLINICAL DATA:  Shortness of Breath EXAM: PORTABLE CHEST - 1 VIEW COMPARISON:  04/07/2011. FINDINGS: Cardiac silhouette is prominent. There is pulmonary interstitial prominence with vascular congestion. Alveolar consolidation in the left base consistent with pneumonia and left-sided moderate pleural effusion. Small pleural effusion on the right. No pneumothorax. Aorta is calcified. There are thoracic degenerative changes. IMPRESSION: Findings suggest CHF. Left basilar consolidation and effusion consistent with pneumonia. Electronically Signed   By: Layla Maw M.D.   On: 11/27/2022 18:03     Assessment and Plan:   1. Acute respiratory failure with hypoxia - suspect multifactorial - primary team felt possible sepsis, L basilar CAP on arrival, treating with antbiotics - underlying COPD contributing, may require home O2 at dc - also suspect component of congestive heart failure, newly diagnosed  2. Acute CHF, type unknown - echo pending - Dr. Jenene Slicker recommends starting IV Lasix 40mg  BID - need to be cognizant of soft BP requiring midodrine - follow daily weights, strict I/Os  3. Elevated troponin - per d/w Dr. Jenene Slicker, do not suspect ACS - agree with ASA/Plavix for now (on Plavix for h/o stroke) - no BB given wheezing/hypotension - statin per primary team - await echo - if EF is down will need to entertain idea of ischemic evaluation  4. AKI - interim baseline not totally clear since 2019  - follow with diuresis (? Cardiorenal); hold on further IVF  5. Tobacco/ETOH abuse - primary team monitoring for withdrawal - substance cessation counseling provided  6. Carotid/subclavian disease - recommend BPs from right arm given reported L subclavian stenosis in 2019 - consider updating duplex   Risk Assessment/Risk Scores:     TIMI Risk Score for Unstable  Angina or Non-ST Elevation MI:   The patient's TIMI risk score is 3, which indicates a 13% risk of all cause mortality, new or recurrent myocardial infarction or need for urgent revascularization in the next 14 days.  New York Heart Association (NYHA) Functional Class NYHA Class III-IV on arrival  For questions or updates, please contact [facility] Please consult [email or web address] for contact info under    Signed, Laurann Montana, PA-C  11/29/2022 10:09 AM

## 2022-11-29 NOTE — Progress Notes (Signed)
PROGRESS NOTE    Patient: Tommy Knight                            PCP: Erasmo Downer, NP                    DOB: [birth year]            DOA: 11/27/2022 ZOX:[id number]             DOS: 11/29/2022, 12:49 PM   LOS: 2 days   Date of Service: The patient was seen and examined on 11/29/2022  Subjective:   The patient was seen and examined this morning, awake alert oriented, denies any chest pain, shortness of breath --- denies any chest pain Currently on 3 L of oxygen, satting 97% still on pressors, blood pressure remained stable  Brief Narrative:   Tommy Knight is a 77 y.o. male with medical history significant of CVA, CHF, tobacco use disorder, hyperlipidemia, GERD, COPD, and more presents the ED with a chief complaint of dyspnea.  Patient reports that he had dyspnea that started 2-3 days ago.  He reports he wears no oxygen at baseline.  His dyspnea has been worse since it started.  He has nebulizer at home.  He uses it in the a.m. and the p.m.  He reports it has not been helping at all with his dyspnea.  He did use it 1 extra time but is still did not help.  He has had a cough productive of yellow sputum.  He has had subjective fever and chills with diaphoresis.  Patient has had a decrease in appetite as well, but no nausea, vomiting, abdominal pain.  Patient denies any chest pain.  Patient has no other complaints at this time.   Patient does smoke a pack per day.  He is willing to use a nicotine patch to try to quit.  He drinks alcohol every day.  He said 1-3 beers per day.  He reports is not more than that.  Discussed with RN to watch for signs of withdrawal.  ED:  Temp 97.6, heart rate 118, respiratory rate 26, blood pressure 130/106, satting normally on 4 L nasal cannula Leukocytosis at 15.7, hemoglobin 15.5, platelets 413 Chemistry reveals a slight hyponatremia 133, elevated creatinine at 1.74 Negative COVID, flu, RSV Chest x-ray shows CHF and left basilar effusion with  pneumonia EKG shows sinus rhythm with a rate in the 90s BPs were soft in the ED.  It was discussed with PCCM and due to patient's history of CHF full fluid bolus was not given.  A 500 mL bolus was given patient was reassessed without signs of fluid overload so he started on IV maintenance fluids Patient was given Zithromax, Rocephin, Lasix, mag, Solu-Medrol, Levophed in the ED Admission requested due to sepsis    Assessment & Plan:   Principal Problem:   Severe sepsis (HCC) Active Problems:   Tobacco use disorder   AKI (acute kidney injury) (HCC)   Acute respiratory failure with hypoxia (HCC)   CHF (congestive heart failure) (HCC)   Hyperlipidemia   Essential hypertension   Elevated troponin   COPD (chronic obstructive pulmonary disease) (HCC)     Assessment and Plan: * Severe sepsis (HCC) Improved sepsis criteria with exception of hypertension  - POA: Met sepsis criteria with Tachycardic, Tachypneic, leukocytosis, hypotensive and acute respiratory failure - Chest x-ray shows left basilar pneumonia - BP was soft:  on Levophed--discontinue   - PCCM was consulted by ED and recommended 500 mL bolus at the time of call, reassess patient and if not showing signs of fluid overload, continue IV fluids - LR running at 100 mL/h--will be discontinued - Lactic acid 1.4 - Procalcitonin is undetectable, so possibly infection is viral in nature - Flu and COVID-negative - Will de-escalate from IV antibiotics of azithromycin and Rocephin-to just p.o. azithromycin - Monitoring closely in ICU  COPD (chronic obstructive pulmonary disease) (HCC) - Currently on 3 L of oxygen, satting 97% ABG    Component Value Date/Time   HCO3 25.9 11/28/2022 0443   ACIDBASEDEF 2.0 11/27/2022 1947   O2SAT 97.1 11/28/2022 0443   - Continue Advair, Singulair - Continue Steroids -will taper down - Continue as needed albuterol and scheduled DuoNeb   Elevated troponin Troponin 74>> 184, 149 - Likely  due to demand ischemia given hypotension, hypoxia and SIRS - Denies of having any chest pain -No EKG changes - Monitor on telemetry  Essential hypertension Hypotensive - Holding Norvasc, benazepril, hydrochlorothiazide in the setting of - hypotension>>>  requiring Levophed -11/29/2022 discontinuing Levophed, starting midodrine  Hyperlipidemia - Continue statin  CHF (congestive heart failure) (HCC) - History of CHF  Last echo 2019 ED JF 50-55%, grade 1 diastolic dysfunction, moderate aortic valve calcification,  repeating 2D echocardiogram:   -Consult cardiology-appreciate further evaluation recommendations  - Chest x-ray shows signs of CHF - Given that patient appeared clinically hypovolemic, and had an AKI fluids were started in the ED as per PCCM advice to ED physician - Will discontinue IV fluids today - Repeat chest x-ray in the a.m. to assess for changes  Acute respiratory failure with hypoxia (HCC)  - O2 sat has improved 97% on 3 L of oxygen Weaning off supplemental oxygen - No oxygen at baseline - Chest x-ray shows CHF and left basilar pneumonia - Patient also has a history of COPD - Troponin elevated at 74 >> 183, 149 No changes in EKG  - Procalcitonin is undetectable - COVID flu RSV negative - Continue nebs - Continue IV Solu-Medrol -with taper - Continue to monitor  AKI (acute kidney injury) (HCC) - Creatinine at baseline 1.19 - Creatinine today 1.74 Lab Results  Component Value Date   CREATININE 1.74 (H) 11/28/2022   CREATININE 1.74 (H) 11/27/2022   CREATININE 1.19 08/22/2017    - Likely due to hypovolemia - Holding benazepril and hydrochlorothiazide - Continue IV hydration - Trending   Tobacco use disorder - Smokes a pack per day - Counseled on importance of cessation - Nicotine patch  started    ----------------------------------------------------------------------------------------------------------------------------------------------- Nutritional status:  The patient's BMI is: Body mass index is 26.97 kg/m. I agree with the assessment and plan as outlined  Cultures; Blood Cultures x 2 >> NGT Urine Culture  >>>  Sputum Culture >>    ------------------------------------------------------------------------------------------------------------------------------------------------  DVT prophylaxis:  heparin injection 5,000 Units Start: 11/27/22 2215 SCDs Start: 11/27/22 2129   Code Status:   Code Status: Full Code  Family Communication: Daughter present at bedside-updated -Advance care planning has been discussed.   Admission status:   Status is: Inpatient Remains inpatient appropriate because: Needing ICU admission, IV pressors for hypotension, IV fluids, breathing treatments, respiratory support   Disposition: From  - home             Planning for discharge in 1-2 days Procedures:   No admission procedures for hospital encounter.   Antimicrobials:  Anti-infectives (From admission, onward)  Start     Dose/Rate Route Frequency Ordered Stop   11/28/22 1300  azithromycin (ZITHROMAX) tablet 500 mg        500 mg Oral Daily 11/28/22 1129     11/27/22 1830  cefTRIAXone (ROCEPHIN) 2 g in sodium chloride 0.9 % 100 mL IVPB  Status:  Discontinued        2 g 200 mL/hr over 30 Minutes Intravenous Every 24 hours 11/27/22 1826 11/28/22 1129   11/27/22 1830  azithromycin (ZITHROMAX) 500 mg in sodium chloride 0.9 % 250 mL IVPB  Status:  Discontinued        500 mg 250 mL/hr over 60 Minutes Intravenous Every 24 hours 11/27/22 1826 11/28/22 1129        Medication:   aspirin EC  81 mg Oral QHS   atorvastatin  40 mg Oral Daily   azithromycin  500 mg Oral Daily   Chlorhexidine Gluconate Cloth  6 each Topical Daily   clopidogrel  75 mg Oral Daily   furosemide   40 mg Intravenous BID   guaiFENesin  600 mg Oral BID   heparin  5,000 Units Subcutaneous Q8H   ipratropium-albuterol  3 mL Nebulization Q6H   midodrine  5 mg Oral TID WC   mometasone-formoterol  2 puff Inhalation BID   montelukast  10 mg Oral Daily   nicotine  21 mg Transdermal Daily   pantoprazole  40 mg Oral BID   predniSONE  40 mg Oral Q breakfast    acetaminophen **OR** acetaminophen, albuterol, methocarbamol, morphine injection, oxyCODONE   Objective:   Vitals:   11/29/22 0900 11/29/22 1000 11/29/22 1100 11/29/22 1134  BP: (!) 99/59 97/65 122/78   Pulse: 88 74 72   Resp: (!) 26 20 (!) 21   Temp:    98.4 F (36.9 C)  TempSrc:    Oral  SpO2: 95% 97% 97%   Weight:      Height:        Intake/Output Summary (Last 24 hours) at 11/29/2022 1249 Last data filed at 11/29/2022 0804 Gross per 24 hour  Intake 1311.02 ml  Output 1075 ml  Net 236.02 ml   Filed Weights   11/27/22 1741 11/27/22 2130 11/28/22 0532  Weight: 74.8 kg 87.7 kg 87.7 kg     Physical examination:    General:  AAO x 3,  cooperative, no distress; on 3 L of oxygen, satting 95%  HEENT:  Normocephalic, PERRL, otherwise with in Normal limits   Neuro:  CNII-XII intact. , normal motor and sensation, reflexes intact   Lungs:   Clear to auscultation BL, Respirations unlabored,  No wheezes / crackles  Cardio:    S1/S2, RRR, No murmure, No Rubs or Gallops   Abdomen:  Soft, non-tender, bowel sounds active all four quadrants, no guarding or peritoneal signs.  Muscular  skeletal:  Limited exam -global generalized weaknesses - in bed, able to move all 4 extremities,   2+ pulses,  symmetric, +1 pitting edema  Skin:  Dry, warm to touch, negative for any Rashes,  Wounds: Please see nursing documentation         ------------------------------------------------------------------------------------------------------------------------------------------    LABs:     Latest Ref Rng & Units 11/28/2022    4:43 AM  11/27/2022    5:57 PM 08/23/2017    4:38 AM  CBC  WBC 4.0 - 10.5 K/uL 6.1  15.7  7.1   Hemoglobin 13.0 - 17.0 g/dL 85.4  62.7  03.5   Hematocrit 39.0 - 52.0 %  41.4  46.3  39.9   Platelets 150 - 400 K/uL 275  413  296       Latest Ref Rng & Units 11/28/2022    4:43 AM 11/27/2022    5:57 PM 08/22/2017   11:24 AM  CMP  Glucose 70 - 99 mg/dL 244  010  272   BUN 8 - 23 mg/dL 30  26  21    Creatinine 0.61 - 1.24 mg/dL 5.36  6.44  0.34   Sodium 135 - 145 mmol/L 132  133  135   Potassium 3.5 - 5.1 mmol/L 4.2  4.1  5.3   Chloride 98 - 111 mmol/L 101  98  101   CO2 22 - 32 mmol/L 22  25  25    Calcium 8.9 - 10.3 mg/dL 7.7  8.0  9.0   Total Protein 6.5 - 8.1 g/dL 5.9   7.5   Total Bilirubin 0.3 - 1.2 mg/dL 0.6   1.0   Alkaline Phos 38 - 126 U/L 54   68   AST 15 - 41 U/L 31   25   ALT 0 - 44 U/L 25   23        Micro Results Recent Results (from the past 240 hour(s))  Resp panel by RT-PCR (RSV, Flu A&B, Covid) Anterior Nasal Swab     Status: None   Collection Time: 11/27/22  6:02 PM   Specimen: Anterior Nasal Swab  Result Value Ref Range Status   SARS Coronavirus 2 by RT PCR NEGATIVE NEGATIVE Final    Comment: (NOTE) SARS-CoV-2 target nucleic acids are NOT DETECTED.  The SARS-CoV-2 RNA is generally detectable in upper respiratory specimens during the acute phase of infection. The lowest concentration of SARS-CoV-2 viral copies this assay can detect is 138 copies/mL. A negative result does not preclude SARS-Cov-2 infection and should not be used as the sole basis for treatment or other patient management decisions. A negative result may occur with  improper specimen collection/handling, submission of specimen other than nasopharyngeal swab, presence of viral mutation(s) within the areas targeted by this assay, and inadequate number of viral copies(<138 copies/mL). A negative result must be combined with clinical observations, patient history, and epidemiological information. The  expected result is Negative.  Fact Sheet for Patients:  BloggerCourse.com  Fact Sheet for Healthcare Providers:  SeriousBroker.it  This test is no t yet approved or cleared by the [place] FDA and  has been authorized for detection and/or diagnosis of SARS-CoV-2 by FDA under an Emergency Use Authorization (EUA). This EUA will remain  in effect (meaning this test can be used) for the duration of the COVID-19 declaration under Section 564(b)(1) of the Act, 21 U.S.C.section 360bbb-3(b)(1), unless the authorization is terminated  or revoked sooner.       Influenza A by PCR NEGATIVE NEGATIVE Final   Influenza B by PCR NEGATIVE NEGATIVE Final    Comment: (NOTE) The Xpert Xpress SARS-CoV-2/FLU/RSV plus assay is intended as an aid in the diagnosis of influenza from Nasopharyngeal swab specimens and should not be used as a sole basis for treatment. Nasal washings and aspirates are unacceptable for Xpert Xpress SARS-CoV-2/FLU/RSV testing.  Fact Sheet for Patients: BloggerCourse.com  Fact Sheet for Healthcare Providers: SeriousBroker.it  This test is not yet approved or cleared by the [place] FDA and has been authorized for detection and/or diagnosis of SARS-CoV-2 by FDA under an Emergency Use Authorization (EUA). This EUA will remain in effect (meaning this test can be used)  for the duration of the COVID-19 declaration under Section 564(b)(1) of the Act, 21 U.S.C. section 360bbb-3(b)(1), unless the authorization is terminated or revoked.     Resp Syncytial Virus by PCR NEGATIVE NEGATIVE Final    Comment: (NOTE) Fact Sheet for Patients: BloggerCourse.com  Fact Sheet for Healthcare Providers: SeriousBroker.it  This test is not yet approved or cleared by the [place] FDA and has been authorized for detection and/or  diagnosis of SARS-CoV-2 by FDA under an Emergency Use Authorization (EUA). This EUA will remain in effect (meaning this test can be used) for the duration of the COVID-19 declaration under Section 564(b)(1) of the Act, 21 U.S.C. section 360bbb-3(b)(1), unless the authorization is terminated or revoked.  Performed at [facility], [address] [place], [place] [address]   Culture, blood (single)     Status: None (Preliminary result)   Collection Time: 11/27/22  6:49 PM   Specimen: BLOOD  Result Value Ref Range Status   Specimen Description BLOOD BOTTLES DRAWN AEROBIC AND ANAEROBIC  Final   Special Requests   Final    BLOOD RIGHT ARM Blood Culture results may not be optimal due to an excessive volume of blood received in culture bottles   Culture   Final    NO GROWTH 2 DAYS Performed at [facility], [address], [place], [place] [address]    Report Status PENDING  Incomplete  MRSA Next Gen by PCR, Nasal     Status: None   Collection Time: 11/27/22  9:28 PM   Specimen: Nasal Mucosa; Nasal Swab  Result Value Ref Range Status   MRSA by PCR Next Gen NOT DETECTED NOT DETECTED Final    Comment: (NOTE) The GeneXpert MRSA Assay (FDA approved for NASAL specimens only), is one component of a comprehensive MRSA colonization surveillance program. It is not intended to diagnose MRSA infection nor to guide or monitor treatment for MRSA infections. Test performance is not FDA approved in patients less than 11 years old. Performed at [facility], [address], [place], [place] [address]     Radiology Reports No results found.  SIGNED: Kendell Bane, MD, FHM. FAAFP. [facility] - [facility] Critical care time spent - 55 min.  In seeing, evaluating and examining the patient. Reviewing medical records, labs, drawn plan of care. [facility],  Pager (please use [email or web address] to page/ text) Please use Epic Secure Chat for non-urgent communication (7AM-7PM)  If 7PM-7AM, please  contact night-coverage [email or web address], 11/29/2022, 12:49 PM

## 2022-11-29 NOTE — Progress Notes (Signed)
Nurse to nurse report called and given to [facility] LPN on 130. Patient to be transferred to room 319. Lennart Pall RN will monitor until transfer.

## 2022-11-29 NOTE — Progress Notes (Signed)
*  PRELIMINARY RESULTS* Echocardiogram 2D Echocardiogram has been performed.  Stacey Drain 11/29/2022, 3:12 PM

## 2022-11-29 NOTE — Plan of Care (Signed)

## 2022-11-29 NOTE — Progress Notes (Signed)
Upon this RN's shift assessment, patient is alert and oriented x 4. Denies pain. Breathing unlabored with diminished and expiratory lung sounds bilaterally.  He requests lip moisturizer.This RN provided vaseline and mout moisturizer. Denies further needs at this time. Kellogg RN

## 2022-11-29 NOTE — Plan of Care (Signed)
  Problem: Education: Goal: Knowledge of General Education information will improve Description: Including pain rating scale, medication(s)/side effects and non-pharmacologic comfort measures Outcome: Progressing   Problem: Activity: Goal: Risk for activity intolerance will decrease Outcome: Progressing   

## 2022-11-29 NOTE — Progress Notes (Signed)
Pt. Requested to walk in the hall. Nurse assisted pt. To walk in the hall while on room air. Pt. Ambulated approximately 30 ft, pt. Then became SOB, Assisted pt. back to room into bed, checked pt.'s O2 saturation, pt. Satting 82 on room air. Pt. Placed on 3L [place] and O2 went back up to 95 on 3 L.SATURATION QUALIFICATIONS: (This note is used to comply with regulatory documentation for home oxygen)  Patient Saturations on Room Air at Rest = 95%  Patient Saturations on Room Air while Ambulating = N/A  Patient Saturations on Liters of oxygen while Ambulating = N/A  Please briefly explain why patient needs home oxygen: Please see above.

## 2022-11-30 ENCOUNTER — Other Ambulatory Visit (HOSPITAL_COMMUNITY): Payer: Self-pay

## 2022-11-30 ENCOUNTER — Inpatient Hospital Stay (HOSPITAL_COMMUNITY): Payer: Medicare HMO

## 2022-11-30 DIAGNOSIS — I6523 Occlusion and stenosis of bilateral carotid arteries: Secondary | ICD-10-CM | POA: Diagnosis not present

## 2022-11-30 DIAGNOSIS — I350 Nonrheumatic aortic (valve) stenosis: Secondary | ICD-10-CM | POA: Diagnosis not present

## 2022-11-30 DIAGNOSIS — I130 Hypertensive heart and chronic kidney disease with heart failure and stage 1 through stage 4 chronic kidney disease, or unspecified chronic kidney disease: Secondary | ICD-10-CM | POA: Diagnosis not present

## 2022-11-30 DIAGNOSIS — A419 Sepsis, unspecified organism: Secondary | ICD-10-CM | POA: Diagnosis not present

## 2022-11-30 DIAGNOSIS — I5021 Acute systolic (congestive) heart failure: Secondary | ICD-10-CM | POA: Diagnosis not present

## 2022-11-30 DIAGNOSIS — R6520 Severe sepsis without septic shock: Secondary | ICD-10-CM | POA: Diagnosis not present

## 2022-11-30 LAB — BASIC METABOLIC PANEL
Anion gap: 11 (ref 5–15)
BUN: 43 mg/dL — ABNORMAL HIGH (ref 8–23)
CO2: 27 mmol/L (ref 22–32)
Calcium: 8.1 mg/dL — ABNORMAL LOW (ref 8.9–10.3)
Chloride: 97 mmol/L — ABNORMAL LOW (ref 98–111)
Creatinine, Ser: 1.67 mg/dL — ABNORMAL HIGH (ref 0.61–1.24)
GFR, Estimated: 42 mL/min — ABNORMAL LOW (ref >60)
Glucose, Bld: 104 mg/dL — ABNORMAL HIGH (ref 70–99)
Potassium: 3.6 mmol/L (ref 3.5–5.1)
Sodium: 135 mmol/L (ref 135–145)

## 2022-11-30 LAB — CBC
HCT: 42.2 % (ref 39.0–52.0)
Hemoglobin: 14.2 g/dL (ref 13.0–17.0)
MCH: 29.9 pg (ref 26.0–34.0)
MCHC: 33.6 g/dL (ref 30.0–36.0)
MCV: 88.8 fL (ref 80.0–100.0)
Platelets: 324 10*3/uL (ref 150–400)
RBC: 4.75 MIL/uL (ref 4.22–5.81)
RDW: 12.6 % (ref 11.5–15.5)
WBC: 12.7 10*3/uL — ABNORMAL HIGH (ref 4.0–10.5)
nRBC: 0 % (ref 0.0–0.2)

## 2022-11-30 LAB — TSH: TSH: 2.215 u[IU]/mL (ref 0.350–4.500)

## 2022-11-30 LAB — BRAIN NATRIURETIC PEPTIDE: B Natriuretic Peptide: 2976 pg/mL — ABNORMAL HIGH (ref 0.0–100.0)

## 2022-11-30 NOTE — Progress Notes (Signed)
SATURATION QUALIFICATIONS: (This note is used to comply with regulatory documentation for home oxygen)  Patient Saturations on Room Air at Rest = 94 %  Patient Saturations on Room Air while Ambulating = 92%  Patient Saturations on 0 Liters of oxygen while Ambulating = 92%  Please briefly explain why patient needs home oxygen:Does not need

## 2022-11-30 NOTE — Progress Notes (Addendum)
[facility] updated of plan for transfer on IM service with cardiology to follow. [facility] cards team will need to follow at [facility] to determine optimal timing of cath once adequately diuresed.

## 2022-11-30 NOTE — Progress Notes (Signed)
Pt had a good night with good urine output. This AM he reports that his breathing is much better. No acute events overnight. Kellogg.RN

## 2022-11-30 NOTE — Progress Notes (Signed)
Mobility Specialist Progress Note:    11/30/22 1400  Mobility  Activity Ambulated with assistance in hallway  Level of Assistance Standby assist, set-up cues, supervision of patient - no hands on  Assistive Device Cane  Distance Ambulated (ft) 60 ft  Range of Motion/Exercises Active;All extremities  Activity Response Tolerated well  Mobility Referral Yes  $Mobility charge 1 Mobility  Mobility Specialist Start Time (ACUTE ONLY) 1400  Mobility Specialist Stop Time (ACUTE ONLY) 1410  Mobility Specialist Time Calculation (min) (ACUTE ONLY) 10 min   Pt received in bed, family in room. Agreeable to mobility, required SBA to stand and ambulate with cane. Tolerated well, baseline SpO2 93% on RA at rest. SpO2 92% on RA after ambulation. Returned pt too bed, all needs met.   Lawerance Bach Mobility Specialist Please contact via Special educational needs teacher or  Rehab office at [phone]

## 2022-11-30 NOTE — Progress Notes (Addendum)
Progress Note  Patient Name: Tommy Knight Date of Encounter: 11/30/2022  Primary Cardiologist: Marjo Bicker, MD  Subjective   Continues to diurese briskly. Breathing improved. No CP or edema. Still on 3L [employer], hopeful we can wean this down. Discussed echo with patient and wife.  Not on telemetry. Will order.  Inpatient Medications    Scheduled Meds:  aspirin EC  81 mg Oral QHS   atorvastatin  40 mg Oral Daily   azithromycin  500 mg Oral Daily   clopidogrel  75 mg Oral Daily   furosemide  40 mg Intravenous BID   guaiFENesin  600 mg Oral BID   heparin  5,000 Units Subcutaneous Q8H   ipratropium-albuterol  3 mL Nebulization Q6H   midodrine  5 mg Oral TID WC   mometasone-formoterol  2 puff Inhalation BID   montelukast  10 mg Oral Daily   nicotine  21 mg Transdermal Daily   pantoprazole  40 mg Oral BID   predniSONE  40 mg Oral Q breakfast   Continuous Infusions:  PRN Meds: acetaminophen **OR** acetaminophen, albuterol, methocarbamol, morphine injection, oxyCODONE   Vital Signs    Vitals:   11/29/22 2329 11/30/22 0121 11/30/22 0408 11/30/22 0710  BP: (!) 132/92  (!) 136/93   Pulse: 81  80   Resp: 18  18   Temp: 98 F (36.7 C)  98.2 F (36.8 C)   TempSrc:      SpO2: 96% 97% 96% 97%  Weight:   85.2 kg   Height:        Intake/Output Summary (Last 24 hours) at 11/30/2022 0947 Last data filed at 11/30/2022 0900 Gross per 24 hour  Intake 720 ml  Output 3050 ml  Net -2330 ml      11/30/2022    4:08 AM 11/28/2022    5:32 AM 11/27/2022    9:30 PM  Last 3 Weights  Weight (lbs) 187 lb 13.3 oz 193 lb 5.5 oz 193 lb 5.5 oz  Weight (kg) 85.2 kg 87.7 kg 87.7 kg     Telemetry    N/A - Personally Reviewed  Physical Exam   GEN: No acute distress.  HEENT: Normocephalic, atraumatic, sclera non-icteric. Neck: No JVD. Faint R carotid bruit. Cardiac: RRR soft SEM at RUSB without audible S2, no rubs or gallops.  Respiratory: Diffusely diminished bilaterally, rare  expiratory rhonchi, no wheezing. Breathing is unlabored. GI: Soft, nontender, non-distended, BS +x 4. MS: no deformity. Extremities: No clubbing or cyanosis. No edema. Distal pedal pulses are 2+ and equal bilaterally. Neuro:  AAOx3. Follows commands. Psych:  Responds to questions appropriately with a normal affect.  Labs    High Sensitivity Troponin:   Recent Labs  Lab 11/27/22 1757 11/28/22 0443 11/28/22 0723  TROPONINIHS 74* 183* 149*      Cardiac EnzymesNo results for input(s): "TROPONINI" in the last 168 hours. No results for input(s): "TROPIPOC" in the last 168 hours.   Chemistry Recent Labs  Lab 11/27/22 1757 11/28/22 0443 11/30/22 0530  NA 133* 132* 135  K 4.1 4.2 3.6  CL 98 101 97*  CO2 25 22 27   GLUCOSE 167* 156* 104*  BUN 26* 30* 43*  CREATININE 1.74* 1.74* 1.67*  CALCIUM 8.0* 7.7* 8.1*  PROT  --  5.9*  --   ALBUMIN  --  2.9*  --   AST  --  31  --   ALT  --  25  --   ALKPHOS  --  54  --  BILITOT  --  0.6  --   GFRNONAA 40* 40* 42*  ANIONGAP 10 9 11      Hematology Recent Labs  Lab 11/27/22 1757 11/28/22 0443 11/30/22 0530  WBC 15.7* 6.1 12.7*  RBC 5.14 4.70 4.75  HGB 15.5 13.9 14.2  HCT 46.3 41.4 42.2  MCV 90.1 88.1 88.8  MCH 30.2 29.6 29.9  MCHC 33.5 33.6 33.6  RDW 12.5 12.3 12.6  PLT 413* 275 324    BNP Recent Labs  Lab 11/29/22 0440 11/30/22 0530  BNP 2,458.0* 2,976.0*     DDimer No results for input(s): "DDIMER" in the last 168 hours.   Radiology    ECHOCARDIOGRAM COMPLETE  Result Date: 11/29/2022    ECHOCARDIOGRAM REPORT   Patient Name:   Tommy Knight Date of Exam: 11/29/2022 Medical Rec #:  [id number]     Height:       71.0 in Accession #:    [id number]    Weight:       193.3 lb Date of Birth:  September 18, 1945     BSA:          2.078 m Patient Age:    77 years      BP:           148/84 mmHg Patient Gender: M             HR:           83 bpm. [facility] Procedure: 2D Echo, Cardiac Doppler and Color Doppler  Indications:    SBE I33.9  History:        Patient has prior history of Echocardiogram examinations, most                 recent 08/23/2017. CHF, Stroke and COPD; Risk                 Factors:Hypertension, Dyslipidemia and Current Smoker.  Sonographer:    Celesta Gentile RCS Referring Phys: [phone] SEYED A SHAHMEHDI IMPRESSIONS  1. Left ventricular ejection fraction, by estimation, is 25 to 30%. The left ventricle has severely decreased function. The left ventricle demonstrates global hypokinesis. Left ventricular diastolic parameters are consistent with Grade III diastolic dysfunction (restrictive). Elevated left atrial pressure.  2. Right ventricular systolic function is mildly reduced. The right ventricular size is normal. Tricuspid regurgitation signal is inadequate for assessing PA pressure.  3. Left atrial size was severely dilated.  4. Right atrial size was mildly dilated.  5. The mitral valve is abnormal. Trivial mitral valve regurgitation. No evidence of mitral stenosis. Moderate mitral annular calcification.  6. The aortic valve was not well visualized but there appears to be severe restriction of the aortic valve leaflet opening. There is severe calcifcation of the aortic valve. Aortic valve regurgitation is not visualized. Aortic valve mean gradient measures 28.0 mmHg. Aortic valve Vmax measures 3.39 m/s. DVI is 0.21. Overall, this is likely severe aortic valve stenosis, low flow-low gradient in the setting of severely reduced LVEF.  7. The inferior vena cava is dilated in size with >50% respiratory variability, suggesting right atrial pressure of 8 mmHg. Comparison(s): No prior Echocardiogram. FINDINGS  Left Ventricle: Left ventricular ejection fraction, by estimation, is 25 to 30%. The left ventricle has severely decreased function. The left ventricle demonstrates global hypokinesis. The left ventricular internal cavity size was normal in size. There is no left ventricular hypertrophy. Left ventricular  diastolic parameters are consistent with Grade III diastolic dysfunction (restrictive). Elevated left atrial  pressure. Right Ventricle: The right ventricular size is normal. No increase in right ventricular wall thickness. Right ventricular systolic function is mildly reduced. Tricuspid regurgitation signal is inadequate for assessing PA pressure. Left Atrium: Left atrial size was severely dilated. Right Atrium: Right atrial size was mildly dilated. Pericardium: Trivial pericardial effusion is present. Mitral Valve: The mitral valve is abnormal. Moderate mitral annular calcification. Trivial mitral valve regurgitation. No evidence of mitral valve stenosis. Tricuspid Valve: The tricuspid valve is normal in structure. Tricuspid valve regurgitation is not demonstrated. No evidence of tricuspid stenosis. Aortic Valve: The aortic valve was not well visualized. There is severe calcifcation of the aortic valve. Aortic valve regurgitation is not visualized. Moderate aortic stenosis is present. Aortic valve mean gradient measures 28.0 mmHg. Aortic valve peak gradient measures 45.9 mmHg. Aortic valve area, by VTI measures 0.66 cm. Pulmonic Valve: The pulmonic valve was not well visualized. Pulmonic valve regurgitation is not visualized. No evidence of pulmonic stenosis. Aorta: The aortic root is normal in size and structure. Venous: The inferior vena cava is dilated in size with greater than 50% respiratory variability, suggesting right atrial pressure of 8 mmHg. IAS/Shunts: No atrial level shunt detected by color flow Doppler.  LEFT VENTRICLE PLAX 2D LVIDd:         5.70 cm      Diastology LVIDs:         4.50 cm      LV e' medial:    5.33 cm/s LV PW:         1.10 cm      LV E/e' medial:  25.0 LV IVS:        1.20 cm      LV e' lateral:   7.07 cm/s LVOT diam:     2.00 cm      LV E/e' lateral: 18.8 LV SV:         49 LV SV Index:   24 LVOT Area:     3.14 cm  LV Volumes (MOD) LV vol d, MOD A2C: 132.0 ml LV vol d, MOD A4C: 148.0  ml LV vol s, MOD A2C: 89.2 ml LV vol s, MOD A4C: 88.4 ml LV SV MOD A2C:     42.8 ml LV SV MOD A4C:     148.0 ml LV SV MOD BP:      54.2 ml RIGHT VENTRICLE RV S prime:     11.60 cm/s TAPSE (M-mode): 1.5 cm LEFT ATRIUM              Index        RIGHT ATRIUM           Index LA diam:        4.70 cm  2.26 cm/m   RA Area:     20.00 cm LA Vol (A2C):   108.0 ml 51.96 ml/m  RA Volume:   64.00 ml  30.79 ml/m LA Vol (A4C):   123.0 ml 59.18 ml/m LA Biplane Vol: 116.0 ml 55.81 ml/m  AORTIC VALVE AV Area (Vmax):    0.67 cm AV Area (Vmean):   0.67 cm AV Area (VTI):     0.66 cm AV Vmax:           338.67 cm/s AV Vmean:          244.250 cm/s AV VTI:            0.742 m AV Peak Grad:      45.9 mmHg AV Mean Grad:  28.0 mmHg LVOT Vmax:         72.50 cm/s LVOT Vmean:        52.400 cm/s LVOT VTI:          0.156 m LVOT/AV VTI ratio: 0.21  AORTA Ao Root diam: 3.30 cm MITRAL VALVE MV Area (PHT): 5.54 cm     SHUNTS MV Decel Time: 137 msec     Systemic VTI:  0.16 m MR Peak grad: 117.9 mmHg    Systemic Diam: 2.00 cm MR Mean grad: 78.0 mmHg MR Vmax:      543.00 cm/s MR Vmean:     422.0 cm/s MV E velocity: 133.00 cm/s MV A velocity: 53.60 cm/s MV E/A ratio:  2.48 Vishnu Priya Mallipeddi Electronically signed by Winfield Rast Mallipeddi Signature Date/Time: 11/29/2022/4:14:00 PM    Final     Cardiac Studies   2d echo 11/29/22   1. Left ventricular ejection fraction, by estimation, is 25 to 30%. The  left ventricle has severely decreased function. The left ventricle  demonstrates global hypokinesis. Left ventricular diastolic parameters are  consistent with Grade III diastolic  dysfunction (restrictive). Elevated left atrial pressure.   2. Right ventricular systolic function is mildly reduced. The right  ventricular size is normal. Tricuspid regurgitation signal is inadequate  for assessing PA pressure.   3. Left atrial size was severely dilated.   4. Right atrial size was mildly dilated.   5. The mitral valve is abnormal.  Trivial mitral valve regurgitation. No  evidence of mitral stenosis. Moderate mitral annular calcification.   6. The aortic valve was not well visualized but there appears to be  severe restriction of the aortic valve leaflet opening. There is severe  calcifcation of the aortic valve. Aortic valve regurgitation is not  visualized. Aortic valve mean gradient  measures 28.0 mmHg. Aortic valve Vmax measures 3.39 m/s. DVI is 0.21.  Overall, this is likely severe aortic valve stenosis, low flow-low  gradient in the setting of severely reduced LVEF.   7. The inferior vena cava is dilated in size with >50% respiratory  variability, suggesting right atrial pressure of 8 mmHg.   Comparison(s): No prior Echocardiogram.   Patient Profile     77 y.o. 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 CXR also raised question of L basilar PNA + CHF. He was treated with supp O2 (new), IV fluids, Levophed, then transitioned to midodrine. Got dose of IV Lasix 9/14 then started scheduled dosing 9/16. Echo shows new low EF 25-30% with G3DD and suspected LF/LG aortic stenosis  Assessment & Plan    1. Acute respiratory failure with hypoxia, suspect multifactorial  - primary team felt possible sepsis, L basilar CAP on arrival, treated with antbiotics/steroids - underlying COPD contributing - new CHF + aortic stenosis also contributing - recommend to wean O2 down as we are able, will need assessment of O2 needs prior to DC   2. Acute HFrEF with suspected severe aortic stenosis - suspect CHF driving main presenting symptoms - continue IV Lasix at present dose - will need transfer to [facility] to further evaluate once more euvolemic, likely needs structural team to see at [facility] once data complete -  hold midodrine rest of the day and follow BP; consider med titration going forward tomorrow based on trends - tentatively discussed consent with patient and he is in agreement for transfer/cath in the upcoming days, timing TBD  Informed Consent   Shared Decision Making/Informed Consent The risks [stroke (1 in 1000), death (1 in 1000), kidney failure [usually temporary] (1 in 500), bleeding (1 in 200), allergic reaction [possibly serious] (1 in 200)], benefits (diagnostic support and management of coronary artery disease) and alternatives of a right and left cardiac catheterization were discussed in detail with Tommy Knight and he is willing to proceed.      3. Elevated troponin - per d/w Dr. Jenene Slicker, do not suspect ACS - agree with ASA/Plavix for now (on Plavix for h/o stroke) - continue atorvastatin 40mg  daily and check lipid profile in AM - hold on BB pending BP trends off midodrine - anticipate R/LHC when more euvolemic and able to lie flat  4. AKI  - interim baseline not totally clear since 2019 when Cr was 1.19, admitted 1.7 range - BUN up but Cr stable/slightly improved to 1.67,  - follow with diuresis (? Cardiorenal)   5. Tobacco/ETOH abuse - primary team monitoring for withdrawal - substance cessation counseling provided   6. Carotid/subclavian disease by imaging 2019 - recommend BPs from right arm given reported L subclavian stenosis in 2019 - will update duplex    For questions or updates, please contact [facility] Please consult [email or web address] for contact info under Cardiology/STEMI.  Signed, Laurann Montana, PA-C 11/30/2022, 9:47 AM

## 2022-11-30 NOTE — Progress Notes (Signed)
Comment: (NOTE) The Xpert Xpress SARS-CoV-2/FLU/RSV plus assay is intended as an aid in the diagnosis of influenza from Nasopharyngeal swab specimens and should not be used as a sole basis for treatment. Nasal washings and aspirates are unacceptable for Xpert Xpress SARS-CoV-2/FLU/RSV testing.  Fact Sheet for Patients: BloggerCourse.com  Fact Sheet for Healthcare Providers: SeriousBroker.it  This test is not yet approved or cleared by the [place] FDA and has been authorized for detection and/or diagnosis of SARS-CoV-2 by FDA under an Emergency Use Authorization (EUA). This EUA will remain in effect (meaning this test can be used) for  the duration of the COVID-19 declaration under Section 564(b)(1) of the Act, 21 U.S.C. section 360bbb-3(b)(1), unless the authorization is terminated or revoked.     Resp Syncytial Virus by PCR NEGATIVE NEGATIVE Final    Comment: (NOTE) Fact Sheet for Patients: BloggerCourse.com  Fact Sheet for Healthcare Providers: SeriousBroker.it  This test is not yet approved or cleared by the [place] FDA and has been authorized for detection and/or diagnosis of SARS-CoV-2 by FDA under an Emergency Use Authorization (EUA). This EUA will remain in effect (meaning this test can be used) for the duration of the COVID-19 declaration under Section 564(b)(1) of the Act, 21 U.S.C. section 360bbb-3(b)(1), unless the authorization is terminated or revoked.  Performed at [facility], [address], [place], [place] [address]   Culture, blood (single)     Status: None (Preliminary result)   Collection Time: 11/27/22  6:49 PM   Specimen: BLOOD  Result Value Ref Range Status   Specimen Description BLOOD BOTTLES DRAWN AEROBIC AND ANAEROBIC  Final   Special Requests   Final    BLOOD RIGHT ARM Blood Culture results may not be optimal due to an excessive volume of blood received in culture bottles   Culture   Final    NO GROWTH 3 DAYS Performed at [facility], [address] [place], [place] [address]    Report Status PENDING  Incomplete  MRSA Next Gen by PCR, Nasal     Status: None   Collection Time: 11/27/22  9:28 PM   Specimen: Nasal Mucosa; Nasal Swab  Result Value Ref Range Status   MRSA by PCR Next Gen NOT DETECTED NOT DETECTED Final    Comment: (NOTE) The GeneXpert MRSA Assay (FDA approved for NASAL specimens only), is one component of a comprehensive MRSA colonization surveillance program. It is not intended to diagnose MRSA infection nor to guide or monitor treatment for MRSA infections. Test performance is not FDA approved in patients  less than 57 years old. Performed at [facility], [address] [place], [place] [address]     Radiology Reports ECHOCARDIOGRAM COMPLETE  Result Date: 11/29/2022    ECHOCARDIOGRAM REPORT   Patient Name:   Tommy Knight Date of Exam: 11/29/2022 Medical Rec #:  [id number]     Height:       71.0 in Accession #:    [id number]    Weight:       193.3 lb Date of Birth:  December 04, 1945     BSA:          2.078 m Patient Age:    77 years      BP:           148/84 mmHg Patient Gender: M             HR:           83 bpm. [facility] Procedure:  PROGRESS NOTE    Patient: Tommy Knight                            PCP: Erasmo Downer, NP                    DOB: Dec 13, 1945            DOA: 11/27/2022 XBJ:[id number]             DOS: 11/30/2022, 1:21 PM   LOS: 3 days   Date of Service: The patient was seen and examined on 11/30/2022  Subjective:   The patient was seen and examined this morning, sitting on side of bed, awake alert oriented no acute distress Reporting much improved shortness of breath Reported he has been voiding well with diuretics  The findings of echo was discussed with the patient and and his wife at bedside He is acknowledging a new finding of his extensive heart failure  Brief Narrative:   Tommy Knight is a 77 y.o. male with medical history significant of CVA, CHF, tobacco use disorder, hyperlipidemia, GERD, COPD, and more presents the ED with a chief complaint of dyspnea.  Patient reports that he had dyspnea that started 2-3 days ago.  He reports he wears no oxygen at baseline.  His dyspnea has been worse since it started.  He has nebulizer at home.  He uses it in the a.m. and the p.m.  He reports it has not been helping at all with his dyspnea.  He did use it 1 extra time but is still did not help.  He has had a cough productive of yellow sputum.  He has had subjective fever and chills with diaphoresis.  Patient has had a decrease in appetite as well, but no nausea, vomiting, abdominal pain.  Patient denies any chest pain.  Patient has no other complaints at this time.   Patient does smoke a pack per day.  He is willing to use a nicotine patch to try to quit.  He drinks alcohol every day.  He said 1-3 beers per day.  He reports is not more than that.  Discussed with RN to watch for signs of withdrawal.  ED:  Temp 97.6, heart rate 118, respiratory rate 26, blood pressure 130/106, satting normally on 4 L nasal cannula Leukocytosis at 15.7, hemoglobin 15.5, platelets 413 Chemistry reveals a slight hyponatremia  133, elevated creatinine at 1.74 Negative COVID, flu, RSV Chest x-ray shows CHF and left basilar effusion with pneumonia EKG shows sinus rhythm with a rate in the 90s BPs were soft in the ED.  It was discussed with PCCM and due to patient's history of CHF full fluid bolus was not given.  A 500 mL bolus was given patient was reassessed without signs of fluid overload so he started on IV maintenance fluids Patient was given Zithromax, Rocephin, Lasix, mag, Solu-Medrol, Levophed in the ED Admission requested due to sepsis    Assessment & Plan:   Principal Problem:   Severe sepsis (HCC) Active Problems:   Tobacco use disorder   AKI (acute kidney injury) (HCC)   Acute respiratory failure with hypoxia (HCC)   CHF (congestive heart failure) (HCC)   Hyperlipidemia   Essential hypertension   Elevated troponin   COPD (chronic obstructive pulmonary disease) (HCC)     Assessment and Plan: * Severe sepsis (HCC) -Resolved sepsis physiology -Improved sepsis criteria with exception of hypertension  -  Comment: (NOTE) The Xpert Xpress SARS-CoV-2/FLU/RSV plus assay is intended as an aid in the diagnosis of influenza from Nasopharyngeal swab specimens and should not be used as a sole basis for treatment. Nasal washings and aspirates are unacceptable for Xpert Xpress SARS-CoV-2/FLU/RSV testing.  Fact Sheet for Patients: BloggerCourse.com  Fact Sheet for Healthcare Providers: SeriousBroker.it  This test is not yet approved or cleared by the [place] FDA and has been authorized for detection and/or diagnosis of SARS-CoV-2 by FDA under an Emergency Use Authorization (EUA). This EUA will remain in effect (meaning this test can be used) for  the duration of the COVID-19 declaration under Section 564(b)(1) of the Act, 21 U.S.C. section 360bbb-3(b)(1), unless the authorization is terminated or revoked.     Resp Syncytial Virus by PCR NEGATIVE NEGATIVE Final    Comment: (NOTE) Fact Sheet for Patients: BloggerCourse.com  Fact Sheet for Healthcare Providers: SeriousBroker.it  This test is not yet approved or cleared by the [place] FDA and has been authorized for detection and/or diagnosis of SARS-CoV-2 by FDA under an Emergency Use Authorization (EUA). This EUA will remain in effect (meaning this test can be used) for the duration of the COVID-19 declaration under Section 564(b)(1) of the Act, 21 U.S.C. section 360bbb-3(b)(1), unless the authorization is terminated or revoked.  Performed at [facility], [address], [place], [place] [address]   Culture, blood (single)     Status: None (Preliminary result)   Collection Time: 11/27/22  6:49 PM   Specimen: BLOOD  Result Value Ref Range Status   Specimen Description BLOOD BOTTLES DRAWN AEROBIC AND ANAEROBIC  Final   Special Requests   Final    BLOOD RIGHT ARM Blood Culture results may not be optimal due to an excessive volume of blood received in culture bottles   Culture   Final    NO GROWTH 3 DAYS Performed at [facility], [address] [place], [place] [address]    Report Status PENDING  Incomplete  MRSA Next Gen by PCR, Nasal     Status: None   Collection Time: 11/27/22  9:28 PM   Specimen: Nasal Mucosa; Nasal Swab  Result Value Ref Range Status   MRSA by PCR Next Gen NOT DETECTED NOT DETECTED Final    Comment: (NOTE) The GeneXpert MRSA Assay (FDA approved for NASAL specimens only), is one component of a comprehensive MRSA colonization surveillance program. It is not intended to diagnose MRSA infection nor to guide or monitor treatment for MRSA infections. Test performance is not FDA approved in patients  less than 57 years old. Performed at [facility], [address] [place], [place] [address]     Radiology Reports ECHOCARDIOGRAM COMPLETE  Result Date: 11/29/2022    ECHOCARDIOGRAM REPORT   Patient Name:   Tommy Knight Date of Exam: 11/29/2022 Medical Rec #:  [id number]     Height:       71.0 in Accession #:    [id number]    Weight:       193.3 lb Date of Birth:  December 04, 1945     BSA:          2.078 m Patient Age:    77 years      BP:           148/84 mmHg Patient Gender: M             HR:           83 bpm. [facility] Procedure:  Comment: (NOTE) The Xpert Xpress SARS-CoV-2/FLU/RSV plus assay is intended as an aid in the diagnosis of influenza from Nasopharyngeal swab specimens and should not be used as a sole basis for treatment. Nasal washings and aspirates are unacceptable for Xpert Xpress SARS-CoV-2/FLU/RSV testing.  Fact Sheet for Patients: BloggerCourse.com  Fact Sheet for Healthcare Providers: SeriousBroker.it  This test is not yet approved or cleared by the [place] FDA and has been authorized for detection and/or diagnosis of SARS-CoV-2 by FDA under an Emergency Use Authorization (EUA). This EUA will remain in effect (meaning this test can be used) for  the duration of the COVID-19 declaration under Section 564(b)(1) of the Act, 21 U.S.C. section 360bbb-3(b)(1), unless the authorization is terminated or revoked.     Resp Syncytial Virus by PCR NEGATIVE NEGATIVE Final    Comment: (NOTE) Fact Sheet for Patients: BloggerCourse.com  Fact Sheet for Healthcare Providers: SeriousBroker.it  This test is not yet approved or cleared by the [place] FDA and has been authorized for detection and/or diagnosis of SARS-CoV-2 by FDA under an Emergency Use Authorization (EUA). This EUA will remain in effect (meaning this test can be used) for the duration of the COVID-19 declaration under Section 564(b)(1) of the Act, 21 U.S.C. section 360bbb-3(b)(1), unless the authorization is terminated or revoked.  Performed at [facility], [address], [place], [place] [address]   Culture, blood (single)     Status: None (Preliminary result)   Collection Time: 11/27/22  6:49 PM   Specimen: BLOOD  Result Value Ref Range Status   Specimen Description BLOOD BOTTLES DRAWN AEROBIC AND ANAEROBIC  Final   Special Requests   Final    BLOOD RIGHT ARM Blood Culture results may not be optimal due to an excessive volume of blood received in culture bottles   Culture   Final    NO GROWTH 3 DAYS Performed at [facility], [address] [place], [place] [address]    Report Status PENDING  Incomplete  MRSA Next Gen by PCR, Nasal     Status: None   Collection Time: 11/27/22  9:28 PM   Specimen: Nasal Mucosa; Nasal Swab  Result Value Ref Range Status   MRSA by PCR Next Gen NOT DETECTED NOT DETECTED Final    Comment: (NOTE) The GeneXpert MRSA Assay (FDA approved for NASAL specimens only), is one component of a comprehensive MRSA colonization surveillance program. It is not intended to diagnose MRSA infection nor to guide or monitor treatment for MRSA infections. Test performance is not FDA approved in patients  less than 57 years old. Performed at [facility], [address] [place], [place] [address]     Radiology Reports ECHOCARDIOGRAM COMPLETE  Result Date: 11/29/2022    ECHOCARDIOGRAM REPORT   Patient Name:   Tommy Knight Date of Exam: 11/29/2022 Medical Rec #:  [id number]     Height:       71.0 in Accession #:    [id number]    Weight:       193.3 lb Date of Birth:  December 04, 1945     BSA:          2.078 m Patient Age:    77 years      BP:           148/84 mmHg Patient Gender: M             HR:           83 bpm. [facility] Procedure:  Comment: (NOTE) The Xpert Xpress SARS-CoV-2/FLU/RSV plus assay is intended as an aid in the diagnosis of influenza from Nasopharyngeal swab specimens and should not be used as a sole basis for treatment. Nasal washings and aspirates are unacceptable for Xpert Xpress SARS-CoV-2/FLU/RSV testing.  Fact Sheet for Patients: BloggerCourse.com  Fact Sheet for Healthcare Providers: SeriousBroker.it  This test is not yet approved or cleared by the [place] FDA and has been authorized for detection and/or diagnosis of SARS-CoV-2 by FDA under an Emergency Use Authorization (EUA). This EUA will remain in effect (meaning this test can be used) for  the duration of the COVID-19 declaration under Section 564(b)(1) of the Act, 21 U.S.C. section 360bbb-3(b)(1), unless the authorization is terminated or revoked.     Resp Syncytial Virus by PCR NEGATIVE NEGATIVE Final    Comment: (NOTE) Fact Sheet for Patients: BloggerCourse.com  Fact Sheet for Healthcare Providers: SeriousBroker.it  This test is not yet approved or cleared by the [place] FDA and has been authorized for detection and/or diagnosis of SARS-CoV-2 by FDA under an Emergency Use Authorization (EUA). This EUA will remain in effect (meaning this test can be used) for the duration of the COVID-19 declaration under Section 564(b)(1) of the Act, 21 U.S.C. section 360bbb-3(b)(1), unless the authorization is terminated or revoked.  Performed at [facility], [address], [place], [place] [address]   Culture, blood (single)     Status: None (Preliminary result)   Collection Time: 11/27/22  6:49 PM   Specimen: BLOOD  Result Value Ref Range Status   Specimen Description BLOOD BOTTLES DRAWN AEROBIC AND ANAEROBIC  Final   Special Requests   Final    BLOOD RIGHT ARM Blood Culture results may not be optimal due to an excessive volume of blood received in culture bottles   Culture   Final    NO GROWTH 3 DAYS Performed at [facility], [address] [place], [place] [address]    Report Status PENDING  Incomplete  MRSA Next Gen by PCR, Nasal     Status: None   Collection Time: 11/27/22  9:28 PM   Specimen: Nasal Mucosa; Nasal Swab  Result Value Ref Range Status   MRSA by PCR Next Gen NOT DETECTED NOT DETECTED Final    Comment: (NOTE) The GeneXpert MRSA Assay (FDA approved for NASAL specimens only), is one component of a comprehensive MRSA colonization surveillance program. It is not intended to diagnose MRSA infection nor to guide or monitor treatment for MRSA infections. Test performance is not FDA approved in patients  less than 57 years old. Performed at [facility], [address] [place], [place] [address]     Radiology Reports ECHOCARDIOGRAM COMPLETE  Result Date: 11/29/2022    ECHOCARDIOGRAM REPORT   Patient Name:   Tommy Knight Date of Exam: 11/29/2022 Medical Rec #:  [id number]     Height:       71.0 in Accession #:    [id number]    Weight:       193.3 lb Date of Birth:  December 04, 1945     BSA:          2.078 m Patient Age:    77 years      BP:           148/84 mmHg Patient Gender: M             HR:           83 bpm. [facility] Procedure:  PROGRESS NOTE    Patient: Tommy Knight                            PCP: Erasmo Downer, NP                    DOB: Dec 13, 1945            DOA: 11/27/2022 XBJ:[id number]             DOS: 11/30/2022, 1:21 PM   LOS: 3 days   Date of Service: The patient was seen and examined on 11/30/2022  Subjective:   The patient was seen and examined this morning, sitting on side of bed, awake alert oriented no acute distress Reporting much improved shortness of breath Reported he has been voiding well with diuretics  The findings of echo was discussed with the patient and and his wife at bedside He is acknowledging a new finding of his extensive heart failure  Brief Narrative:   Tommy Knight is a 77 y.o. male with medical history significant of CVA, CHF, tobacco use disorder, hyperlipidemia, GERD, COPD, and more presents the ED with a chief complaint of dyspnea.  Patient reports that he had dyspnea that started 2-3 days ago.  He reports he wears no oxygen at baseline.  His dyspnea has been worse since it started.  He has nebulizer at home.  He uses it in the a.m. and the p.m.  He reports it has not been helping at all with his dyspnea.  He did use it 1 extra time but is still did not help.  He has had a cough productive of yellow sputum.  He has had subjective fever and chills with diaphoresis.  Patient has had a decrease in appetite as well, but no nausea, vomiting, abdominal pain.  Patient denies any chest pain.  Patient has no other complaints at this time.   Patient does smoke a pack per day.  He is willing to use a nicotine patch to try to quit.  He drinks alcohol every day.  He said 1-3 beers per day.  He reports is not more than that.  Discussed with RN to watch for signs of withdrawal.  ED:  Temp 97.6, heart rate 118, respiratory rate 26, blood pressure 130/106, satting normally on 4 L nasal cannula Leukocytosis at 15.7, hemoglobin 15.5, platelets 413 Chemistry reveals a slight hyponatremia  133, elevated creatinine at 1.74 Negative COVID, flu, RSV Chest x-ray shows CHF and left basilar effusion with pneumonia EKG shows sinus rhythm with a rate in the 90s BPs were soft in the ED.  It was discussed with PCCM and due to patient's history of CHF full fluid bolus was not given.  A 500 mL bolus was given patient was reassessed without signs of fluid overload so he started on IV maintenance fluids Patient was given Zithromax, Rocephin, Lasix, mag, Solu-Medrol, Levophed in the ED Admission requested due to sepsis    Assessment & Plan:   Principal Problem:   Severe sepsis (HCC) Active Problems:   Tobacco use disorder   AKI (acute kidney injury) (HCC)   Acute respiratory failure with hypoxia (HCC)   CHF (congestive heart failure) (HCC)   Hyperlipidemia   Essential hypertension   Elevated troponin   COPD (chronic obstructive pulmonary disease) (HCC)     Assessment and Plan: * Severe sepsis (HCC) -Resolved sepsis physiology -Improved sepsis criteria with exception of hypertension  -  Comment: (NOTE) The Xpert Xpress SARS-CoV-2/FLU/RSV plus assay is intended as an aid in the diagnosis of influenza from Nasopharyngeal swab specimens and should not be used as a sole basis for treatment. Nasal washings and aspirates are unacceptable for Xpert Xpress SARS-CoV-2/FLU/RSV testing.  Fact Sheet for Patients: BloggerCourse.com  Fact Sheet for Healthcare Providers: SeriousBroker.it  This test is not yet approved or cleared by the [place] FDA and has been authorized for detection and/or diagnosis of SARS-CoV-2 by FDA under an Emergency Use Authorization (EUA). This EUA will remain in effect (meaning this test can be used) for  the duration of the COVID-19 declaration under Section 564(b)(1) of the Act, 21 U.S.C. section 360bbb-3(b)(1), unless the authorization is terminated or revoked.     Resp Syncytial Virus by PCR NEGATIVE NEGATIVE Final    Comment: (NOTE) Fact Sheet for Patients: BloggerCourse.com  Fact Sheet for Healthcare Providers: SeriousBroker.it  This test is not yet approved or cleared by the [place] FDA and has been authorized for detection and/or diagnosis of SARS-CoV-2 by FDA under an Emergency Use Authorization (EUA). This EUA will remain in effect (meaning this test can be used) for the duration of the COVID-19 declaration under Section 564(b)(1) of the Act, 21 U.S.C. section 360bbb-3(b)(1), unless the authorization is terminated or revoked.  Performed at [facility], [address], [place], [place] [address]   Culture, blood (single)     Status: None (Preliminary result)   Collection Time: 11/27/22  6:49 PM   Specimen: BLOOD  Result Value Ref Range Status   Specimen Description BLOOD BOTTLES DRAWN AEROBIC AND ANAEROBIC  Final   Special Requests   Final    BLOOD RIGHT ARM Blood Culture results may not be optimal due to an excessive volume of blood received in culture bottles   Culture   Final    NO GROWTH 3 DAYS Performed at [facility], [address] [place], [place] [address]    Report Status PENDING  Incomplete  MRSA Next Gen by PCR, Nasal     Status: None   Collection Time: 11/27/22  9:28 PM   Specimen: Nasal Mucosa; Nasal Swab  Result Value Ref Range Status   MRSA by PCR Next Gen NOT DETECTED NOT DETECTED Final    Comment: (NOTE) The GeneXpert MRSA Assay (FDA approved for NASAL specimens only), is one component of a comprehensive MRSA colonization surveillance program. It is not intended to diagnose MRSA infection nor to guide or monitor treatment for MRSA infections. Test performance is not FDA approved in patients  less than 57 years old. Performed at [facility], [address] [place], [place] [address]     Radiology Reports ECHOCARDIOGRAM COMPLETE  Result Date: 11/29/2022    ECHOCARDIOGRAM REPORT   Patient Name:   Tommy Knight Date of Exam: 11/29/2022 Medical Rec #:  [id number]     Height:       71.0 in Accession #:    [id number]    Weight:       193.3 lb Date of Birth:  December 04, 1945     BSA:          2.078 m Patient Age:    77 years      BP:           148/84 mmHg Patient Gender: M             HR:           83 bpm. [facility] Procedure:

## 2022-11-30 NOTE — Progress Notes (Signed)
Showered, walked some in hallway afterwards and O2 sat was 92% on room air.  Reds value of 32.  Started telemetry monitoring Wife assisted with shower and is a bedside.

## 2022-11-30 NOTE — TOC Benefit Eligibility Note (Signed)
Patient Product/process development scientist completed.    The patient is insured through [employer]. Patient has Medicare and is not eligible for a copay card, but may be able to apply for patient assistance, if available.    Ran test claim for Entresto 24-26 mg and the current 30 day co-pay is $45.00.  Ran test claim for Jardiance 10 mg and the current 30 day co-pay is $45.00.  Ran test claim for Farxiga 10 mg and the current 30 day co-pay is $95.00.   This test claim was processed through [facility]- copay amounts may vary at other pharmacies due to pharmacy/plan contracts, or as the patient moves through the different stages of their insurance plan.     Roland Earl, CPHT Pharmacy Technician III Certified Patient Advocate [facility] Patient Advocate Team Direct Number: [phone]  Fax: [phone]

## 2022-12-01 DIAGNOSIS — I350 Nonrheumatic aortic (valve) stenosis: Secondary | ICD-10-CM | POA: Diagnosis not present

## 2022-12-01 DIAGNOSIS — A419 Sepsis, unspecified organism: Secondary | ICD-10-CM | POA: Diagnosis not present

## 2022-12-01 DIAGNOSIS — I130 Hypertensive heart and chronic kidney disease with heart failure and stage 1 through stage 4 chronic kidney disease, or unspecified chronic kidney disease: Secondary | ICD-10-CM | POA: Diagnosis not present

## 2022-12-01 DIAGNOSIS — R6520 Severe sepsis without septic shock: Secondary | ICD-10-CM | POA: Diagnosis not present

## 2022-12-01 DIAGNOSIS — I5021 Acute systolic (congestive) heart failure: Secondary | ICD-10-CM | POA: Diagnosis not present

## 2022-12-01 DIAGNOSIS — I6523 Occlusion and stenosis of bilateral carotid arteries: Secondary | ICD-10-CM | POA: Diagnosis not present

## 2022-12-01 LAB — BASIC METABOLIC PANEL WITH GFR
Anion gap: 11 (ref 5–15)
BUN: 45 mg/dL — ABNORMAL HIGH (ref 8–23)
CO2: 27 mmol/L (ref 22–32)
Calcium: 8.1 mg/dL — ABNORMAL LOW (ref 8.9–10.3)
Chloride: 96 mmol/L — ABNORMAL LOW (ref 98–111)
Creatinine, Ser: 1.82 mg/dL — ABNORMAL HIGH (ref 0.61–1.24)
GFR, Estimated: 38 mL/min — ABNORMAL LOW (ref >60)
Glucose, Bld: 148 mg/dL — ABNORMAL HIGH (ref 70–99)
Potassium: 3.5 mmol/L (ref 3.5–5.1)
Sodium: 134 mmol/L — ABNORMAL LOW (ref 135–145)

## 2022-12-01 MED ORDER — FUROSEMIDE 20 MG PO TABS
20.0000 mg | ORAL_TABLET | Freq: Every day | ORAL | Status: DC
  Administered 2022-12-01: 20 mg via ORAL
  Filled 2022-12-01 (×2): qty 1

## 2022-12-01 MED ORDER — POTASSIUM CHLORIDE CRYS ER 20 MEQ PO TBCR
40.0000 meq | EXTENDED_RELEASE_TABLET | Freq: Two times a day (BID) | ORAL | Status: AC
  Administered 2022-12-01 (×2): 40 meq via ORAL
  Filled 2022-12-01 (×2): qty 2

## 2022-12-01 NOTE — Progress Notes (Signed)
PROGRESS NOTE    Tommy Knight  VOZ:[id number] DOB: 03-28-1945 DOA: 11/27/2022 PCP: Erasmo Downer, NP   Brief Narrative:    Tommy Knight is a 77 y.o. male with medical history significant of CVA, CHF, tobacco use disorder, hyperlipidemia, GERD, COPD, and more presents the ED with a chief complaint of dyspnea.  He was admitted with concerns for severe sepsis with possible pneumonia and initially had some hypotension, but this has not been ruled out.  He was admitted with acute hypoxemic respiratory failure likely secondary to acute diastolic heart failure and is now noted to have acute systolic heart failure exacerbation with new onset cardiomyopathy and LVEF 25-30%.  He is currently awaiting cardiac catheterization.  Assessment & Plan:   Principal Problem:   Severe sepsis (HCC) Active Problems:   Tobacco use disorder   AKI (acute kidney injury) (HCC)   Acute respiratory failure with hypoxia (HCC)   CHF (congestive heart failure) (HCC)   Hyperlipidemia   Essential hypertension   Elevated troponin   COPD (chronic obstructive pulmonary disease) (HCC)  Assessment and Plan:  Acute hypoxemic respiratory failure secondary to acute systolic CHF exacerbation with new onset cardiomyopathy -LVEF 25-30% with unclear etiology -Noted to have possible severe aortic valve stenosis -Awaiting cardiac catheterization with RHC/LHC upon transfer to [facility] -Cardiology following -Plan to keep n.p.o. after midnight -Plan to resume oral Lasix as well as beta-blockers and Entresto later in the day after repeat BMP  COPD (chronic obstructive pulmonary disease) (HCC) - Resolved weaned off 3 L of oxygen, currently on room air satting 92% - Continue Advair, Singulair - Initially was on some antibiotics along with azithromycin and steroids -Respiratory status has improved considerably, continue breathing treatments as needed and continue to monitor -Wean oxygen status to room air as tolerated      Elevated troponin likely related to demand ischemia -Plan for cardiac catheterization as described above   Essential hypertension Hypotensive - Holding Norvasc, benazepril, hydrochlorothiazide in the setting of - hypotension>>>  requiring Levophed -11/29/2022 discontinuing Levophed, starting midodrine -11/30/2022 - blood pressure stabilized, will discontinue midodrine  Mild hypokalemia -Secondary to diuresis with Lasix, replete and reevaluate   Hyperlipidemia - Continue statin    AKI (acute kidney injury) (HCC) - Creatinine at baseline 1.19 - Creatinine today 1.77 -Recheck BMP this afternoon and hold further IV Lasix for now   Tobacco use disorder - Smokes a pack per day - Counseled on importance of cessation - Nicotine patch started    DVT prophylaxis:Heparin Code Status: Full Family Communication: Wife at bedside 9/18 Disposition Plan: Transfer to [facility] for cardiac cath Status is: Inpatient Remains inpatient appropriate because: Need for IV medications.  Consultants:  Cardiology  Procedures:  None  Antimicrobials:  Anti-infectives (From admission, onward)    Start     Dose/Rate Route Frequency Ordered Stop   11/28/22 1300  azithromycin (ZITHROMAX) tablet 500 mg        500 mg Oral Daily 11/28/22 1129     11/27/22 1830  cefTRIAXone (ROCEPHIN) 2 g in sodium chloride 0.9 % 100 mL IVPB  Status:  Discontinued        2 g 200 mL/hr over 30 Minutes Intravenous Every 24 hours 11/27/22 1826 11/28/22 1129   11/27/22 1830  azithromycin (ZITHROMAX) 500 mg in sodium chloride 0.9 % 250 mL IVPB  Status:  Discontinued        500 mg 250 mL/hr over 60 Minutes Intravenous Every 24 hours 11/27/22 1826 11/28/22 1129  Subjective: Patient seen and evaluated today with no new acute complaints or concerns. No acute concerns or events noted overnight.  He has diuresed quite well and denies any further shortness of breath or chest pain or other concerns.  Objective: Vitals:    11/30/22 2058 12/01/22 0225 12/01/22 0536 12/01/22 1248  BP: (!) 128/94  (!) 139/93 (!) 116/93  Pulse: 80  73 84  Resp: 20  18 18   Temp: 98.5 F (36.9 C)  97.8 F (36.6 C) 98.1 F (36.7 C)  TempSrc:   Oral   SpO2: 94% (!) 85% 96% 95%  Weight:   82.4 kg   Height:        Intake/Output Summary (Last 24 hours) at 12/01/2022 1304 Last data filed at 12/01/2022 0830 Gross per 24 hour  Intake 240 ml  Output 3275 ml  Net -3035 ml   Filed Weights   11/28/22 0532 11/30/22 0408 12/01/22 0536  Weight: 87.7 kg 85.2 kg 82.4 kg    Examination:  General exam: Appears calm and comfortable  Respiratory system: Clear to auscultation. Respiratory effort normal.  2 L nasal cannula Cardiovascular system: S1 & S2 heard, RRR.  Gastrointestinal system: Abdomen is soft Central nervous system: Alert and awake Extremities: No edema Skin: No significant lesions noted Psychiatry: Flat affect.    Data Reviewed: I have personally reviewed following labs and imaging studies  CBC: Recent Labs  Lab 11/27/22 1757 11/28/22 0443 11/30/22 0530 12/01/22 0425  WBC 15.7* 6.1 12.7* 11.8*  NEUTROABS 10.4* 5.6  --   --   HGB 15.5 13.9 14.2 15.3  HCT 46.3 41.4 42.2 46.2  MCV 90.1 88.1 88.8 87.7  PLT 413* 275 324 361   Basic Metabolic Panel: Recent Labs  Lab 11/27/22 1757 11/28/22 0443 11/30/22 0530 12/01/22 0425  NA 133* 132* 135 134*  K 4.1 4.2 3.6 3.1*  CL 98 101 97* 93*  CO2 25 22 27 29   GLUCOSE 167* 156* 104* 102*  BUN 26* 30* 43* 45*  CREATININE 1.74* 1.74* 1.67* 1.77*  CALCIUM 8.0* 7.7* 8.1* 8.3*  MG  --  1.8  --   --    GFR: Estimated Creatinine Clearance: 36.6 mL/min (A) (by C-G formula based on SCr of 1.77 mg/dL (H)). Liver Function Tests: Recent Labs  Lab 11/28/22 0443  AST 31  ALT 25  ALKPHOS 54  BILITOT 0.6  PROT 5.9*  ALBUMIN 2.9*   No results for input(s): "LIPASE", "AMYLASE" in the last 168 hours. No results for input(s): "AMMONIA" in the last 168  hours. Coagulation Profile: No results for input(s): "INR", "PROTIME" in the last 168 hours. Cardiac Enzymes: No results for input(s): "CKTOTAL", "CKMB", "CKMBINDEX", "TROPONINI" in the last 168 hours. BNP (last 3 results) No results for input(s): "PROBNP" in the last 8760 hours. HbA1C: No results for input(s): "HGBA1C" in the last 72 hours. CBG: No results for input(s): "GLUCAP" in the last 168 hours. Lipid Profile: Recent Labs    12/01/22 0425  CHOL 195  HDL 52  LDLCALC 121*  TRIG 111  CHOLHDL 3.8   Thyroid Function Tests: Recent Labs    11/30/22 0530  TSH 2.215   Anemia Panel: No results for input(s): "VITAMINB12", "FOLATE", "FERRITIN", "TIBC", "IRON", "RETICCTPCT" in the last 72 hours. Sepsis Labs: Recent Labs  Lab 11/27/22 1849 11/27/22 2140  PROCALCITON  --  <0.10  LATICACIDVEN 1.0 1.4    Recent Results (from the past 240 hour(s))  Resp panel by RT-PCR (RSV, Flu A&B, Covid) Anterior  Nasal Swab     Status: None   Collection Time: 11/27/22  6:02 PM   Specimen: Anterior Nasal Swab  Result Value Ref Range Status   SARS Coronavirus 2 by RT PCR NEGATIVE NEGATIVE Final    Comment: (NOTE) SARS-CoV-2 target nucleic acids are NOT DETECTED.  The SARS-CoV-2 RNA is generally detectable in upper respiratory specimens during the acute phase of infection. The lowest concentration of SARS-CoV-2 viral copies this assay can detect is 138 copies/mL. A negative result does not preclude SARS-Cov-2 infection and should not be used as the sole basis for treatment or other patient management decisions. A negative result may occur with  improper specimen collection/handling, submission of specimen other than nasopharyngeal swab, presence of viral mutation(s) within the areas targeted by this assay, and inadequate number of viral copies(<138 copies/mL). A negative result must be combined with clinical observations, patient history, and epidemiological information. The expected  result is Negative.  Fact Sheet for Patients:  BloggerCourse.com  Fact Sheet for Healthcare Providers:  SeriousBroker.it  This test is no t yet approved or cleared by the [place] FDA and  has been authorized for detection and/or diagnosis of SARS-CoV-2 by FDA under an Emergency Use Authorization (EUA). This EUA will remain  in effect (meaning this test can be used) for the duration of the COVID-19 declaration under Section 564(b)(1) of the Act, 21 U.S.C.section 360bbb-3(b)(1), unless the authorization is terminated  or revoked sooner.       Influenza A by PCR NEGATIVE NEGATIVE Final   Influenza B by PCR NEGATIVE NEGATIVE Final    Comment: (NOTE) The Xpert Xpress SARS-CoV-2/FLU/RSV plus assay is intended as an aid in the diagnosis of influenza from Nasopharyngeal swab specimens and should not be used as a sole basis for treatment. Nasal washings and aspirates are unacceptable for Xpert Xpress SARS-CoV-2/FLU/RSV testing.  Fact Sheet for Patients: BloggerCourse.com  Fact Sheet for Healthcare Providers: SeriousBroker.it  This test is not yet approved or cleared by the [place] FDA and has been authorized for detection and/or diagnosis of SARS-CoV-2 by FDA under an Emergency Use Authorization (EUA). This EUA will remain in effect (meaning this test can be used) for the duration of the COVID-19 declaration under Section 564(b)(1) of the Act, 21 U.S.C. section 360bbb-3(b)(1), unless the authorization is terminated or revoked.     Resp Syncytial Virus by PCR NEGATIVE NEGATIVE Final    Comment: (NOTE) Fact Sheet for Patients: BloggerCourse.com  Fact Sheet for Healthcare Providers: SeriousBroker.it  This test is not yet approved or cleared by the [place] FDA and has been authorized for detection and/or diagnosis of  SARS-CoV-2 by FDA under an Emergency Use Authorization (EUA). This EUA will remain in effect (meaning this test can be used) for the duration of the COVID-19 declaration under Section 564(b)(1) of the Act, 21 U.S.C. section 360bbb-3(b)(1), unless the authorization is terminated or revoked.  Performed at [facility], [address], [place], [place] [address]   Culture, blood (single)     Status: None (Preliminary result)   Collection Time: 11/27/22  6:49 PM   Specimen: BLOOD  Result Value Ref Range Status   Specimen Description BLOOD BOTTLES DRAWN AEROBIC AND ANAEROBIC  Final   Special Requests   Final    BLOOD RIGHT ARM Blood Culture results may not be optimal due to an excessive volume of blood received in culture bottles   Culture   Final    NO GROWTH 4 DAYS Performed at [facility],  [address] [place], [place] [address]    Report Status PENDING  Incomplete  MRSA Next Gen by PCR, Nasal     Status: None   Collection Time: 11/27/22  9:28 PM   Specimen: Nasal Mucosa; Nasal Swab  Result Value Ref Range Status   MRSA by PCR Next Gen NOT DETECTED NOT DETECTED Final    Comment: (NOTE) The GeneXpert MRSA Assay (FDA approved for NASAL specimens only), is one component of a comprehensive MRSA colonization surveillance program. It is not intended to diagnose MRSA infection nor to guide or monitor treatment for MRSA infections. Test performance is not FDA approved in patients less than 37 years old. Performed at [facility], [address] [place], [place] [address]          Radiology Studies: US Carotid Bilateral  Result Date: 11/30/2022 CLINICAL DATA:  67596 Carotid artery stenosis 67596 [id number] Subclavian arterial stenosis (HCC) 147829 EXAM: BILATERAL CAROTID DUPLEX ULTRASOUND TECHNIQUE: Wallace Cullens scale imaging, color Doppler and duplex ultrasound were performed of bilateral carotid and vertebral arteries in the neck. COMPARISON:  CT angiography head and neck August 22, 2017  FINDINGS: Criteria: Quantification of carotid stenosis is based on velocity parameters that correlate the residual internal carotid diameter with NASCET-based stenosis levels, using the diameter of the distal internal carotid lumen as the denominator for stenosis measurement. The following velocity measurements were obtained: RIGHT ICA: 120/13 cm/sec CCA: 73/4 cm/sec SYSTOLIC ICA/CCA RATIO:  1.6 ECA: 175 cm/sec LEFT ICA: 139/2 cm/sec CCA: 57/5 cm/sec SYSTOLIC ICA/CCA RATIO:  2.4 ECA: 151 cm/sec RIGHT CAROTID ARTERY: Scattered atherosclerotic plaque in the common carotid artery and more mild atherosclerotic plaque in the carotid bulb results in less than 50% stenosis by Doppler criteria. RIGHT VERTEBRAL ARTERY: Antegrade flow with dampened arterial waveforms. LEFT CAROTID ARTERY: Scattered moderate atherosclerotic plaque in the common carotid artery and carotid bulb results in elevated velocities compatible with 50-69% stenosis by Doppler criteria. Relatively high resistance waveforms in the internal carotid artery are also noted. LEFT VERTEBRAL ARTERY:  Not visualized. IMPRESSION: 1. Mild atherosclerotic plaque of the right carotid bulb results in less than 50% stenosis by Doppler criteria. 2. Moderate atherosclerotic plaque of the left carotid circulation results in 50-69% stenosis by Doppler criteria. 3. Right vertebral artery demonstrates antegrade flow with dampened arterial waveforms, suggestive of more proximal stenosis. Comparison to CTA in 2019 demonstrated advanced atherosclerotic plaque involving the right subclavian artery. 4. Left vertebral artery is not visualized. 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 Electronically Signed   By: Olive Bass M.D.   On: 11/30/2022 15:20   ECHOCARDIOGRAM COMPLETE  Result Date: 11/29/2022    ECHOCARDIOGRAM REPORT   Patient Name:   DILSON MALLY Date of Exam: 11/29/2022 Medical Rec #:  [id number]     Height:        71.0 in Accession #:    [id number]    Weight:       193.3 lb Date of Birth:  [birth year]     BSA:          2.078 m Patient Age:    77 years      BP:           148/84 mmHg Patient Gender: M             HR:           83 bpm. [facility] Procedure: 2D Echo, Cardiac Doppler and Color Doppler Indications:  SBE I33.9  History:        Patient has prior history of Echocardiogram examinations, most                 recent 08/23/2017. CHF, Stroke and COPD; Risk                 Factors:Hypertension, Dyslipidemia and Current Smoker.  Sonographer:    Celesta Gentile RCS Referring Phys: [phone] SEYED A SHAHMEHDI IMPRESSIONS  1. Left ventricular ejection fraction, by estimation, is 25 to 30%. The left ventricle has severely decreased function. The left ventricle demonstrates global hypokinesis. Left ventricular diastolic parameters are consistent with Grade III diastolic dysfunction (restrictive). Elevated left atrial pressure.  2. Right ventricular systolic function is mildly reduced. The right ventricular size is normal. Tricuspid regurgitation signal is inadequate for assessing PA pressure.  3. Left atrial size was severely dilated.  4. Right atrial size was mildly dilated.  5. The mitral valve is abnormal. Trivial mitral valve regurgitation. No evidence of mitral stenosis. Moderate mitral annular calcification.  6. The aortic valve was not well visualized but there appears to be severe restriction of the aortic valve leaflet opening. There is severe calcifcation of the aortic valve. Aortic valve regurgitation is not visualized. Aortic valve mean gradient measures 28.0 mmHg. Aortic valve Vmax measures 3.39 m/s. DVI is 0.21. Overall, this is likely severe aortic valve stenosis, low flow-low gradient in the setting of severely reduced LVEF.  7. The inferior vena cava is dilated in size with >50% respiratory variability, suggesting right atrial pressure of 8 mmHg. Comparison(s): No prior Echocardiogram. FINDINGS  Left  Ventricle: Left ventricular ejection fraction, by estimation, is 25 to 30%. The left ventricle has severely decreased function. The left ventricle demonstrates global hypokinesis. The left ventricular internal cavity size was normal in size. There is no left ventricular hypertrophy. Left ventricular diastolic parameters are consistent with Grade III diastolic dysfunction (restrictive). Elevated left atrial pressure. Right Ventricle: The right ventricular size is normal. No increase in right ventricular wall thickness. Right ventricular systolic function is mildly reduced. Tricuspid regurgitation signal is inadequate for assessing PA pressure. Left Atrium: Left atrial size was severely dilated. Right Atrium: Right atrial size was mildly dilated. Pericardium: Trivial pericardial effusion is present. Mitral Valve: The mitral valve is abnormal. Moderate mitral annular calcification. Trivial mitral valve regurgitation. No evidence of mitral valve stenosis. Tricuspid Valve: The tricuspid valve is normal in structure. Tricuspid valve regurgitation is not demonstrated. No evidence of tricuspid stenosis. Aortic Valve: The aortic valve was not well visualized. There is severe calcifcation of the aortic valve. Aortic valve regurgitation is not visualized. Moderate aortic stenosis is present. Aortic valve mean gradient measures 28.0 mmHg. Aortic valve peak gradient measures 45.9 mmHg. Aortic valve area, by VTI measures 0.66 cm. Pulmonic Valve: The pulmonic valve was not well visualized. Pulmonic valve regurgitation is not visualized. No evidence of pulmonic stenosis. Aorta: The aortic root is normal in size and structure. Venous: The inferior vena cava is dilated in size with greater than 50% respiratory variability, suggesting right atrial pressure of 8 mmHg. IAS/Shunts: No atrial level shunt detected by color flow Doppler.  LEFT VENTRICLE PLAX 2D LVIDd:         5.70 cm      Diastology LVIDs:         4.50 cm      LV e'  medial:    5.33 cm/s LV PW:         1.10 cm  LV E/e' medial:  25.0 LV IVS:        1.20 cm      LV e' lateral:   7.07 cm/s LVOT diam:     2.00 cm      LV E/e' lateral: 18.8 LV SV:         49 LV SV Index:   24 LVOT Area:     3.14 cm  LV Volumes (MOD) LV vol d, MOD A2C: 132.0 ml LV vol d, MOD A4C: 148.0 ml LV vol s, MOD A2C: 89.2 ml LV vol s, MOD A4C: 88.4 ml LV SV MOD A2C:     42.8 ml LV SV MOD A4C:     148.0 ml LV SV MOD BP:      54.2 ml RIGHT VENTRICLE RV S prime:     11.60 cm/s TAPSE (M-mode): 1.5 cm LEFT ATRIUM              Index        RIGHT ATRIUM           Index LA diam:        4.70 cm  2.26 cm/m   RA Area:     20.00 cm LA Vol (A2C):   108.0 ml 51.96 ml/m  RA Volume:   64.00 ml  30.79 ml/m LA Vol (A4C):   123.0 ml 59.18 ml/m LA Biplane Vol: 116.0 ml 55.81 ml/m  AORTIC VALVE AV Area (Vmax):    0.67 cm AV Area (Vmean):   0.67 cm AV Area (VTI):     0.66 cm AV Vmax:           338.67 cm/s AV Vmean:          244.250 cm/s AV VTI:            0.742 m AV Peak Grad:      45.9 mmHg AV Mean Grad:      28.0 mmHg LVOT Vmax:         72.50 cm/s LVOT Vmean:        52.400 cm/s LVOT VTI:          0.156 m LVOT/AV VTI ratio: 0.21  AORTA Ao Root diam: 3.30 cm MITRAL VALVE MV Area (PHT): 5.54 cm     SHUNTS MV Decel Time: 137 msec     Systemic VTI:  0.16 m MR Peak grad: 117.9 mmHg    Systemic Diam: 2.00 cm MR Mean grad: 78.0 mmHg MR Vmax:      543.00 cm/s MR Vmean:     422.0 cm/s MV E velocity: 133.00 cm/s MV A velocity: 53.60 cm/s MV E/A ratio:  2.48 Vishnu Priya Mallipeddi Electronically signed by Winfield Rast Mallipeddi Signature Date/Time: 11/29/2022/4:14:00 PM    Final         Scheduled Meds:  aspirin EC  81 mg Oral QHS   atorvastatin  40 mg Oral Daily   azithromycin  500 mg Oral Daily   clopidogrel  75 mg Oral Daily   guaiFENesin  600 mg Oral BID   heparin  5,000 Units Subcutaneous Q8H   ipratropium-albuterol  3 mL Nebulization Q6H   mometasone-formoterol  2 puff Inhalation BID   nicotine  21 mg  Transdermal Daily   pantoprazole  40 mg Oral BID   potassium chloride  40 mEq Oral BID     LOS: 4 days    Time spent: 35 minutes    Elleigh Cassetta Hoover Brunette, DO [facility]  If 7PM-7AM, please contact night-coverage  [email or web address] 12/01/2022, 1:04 PM

## 2022-12-01 NOTE — Plan of Care (Signed)
Problem: Clinical Measurements: Goal: Ability to maintain clinical measurements within normal limits will improve Outcome: Progressing Goal: Diagnostic test results will improve Outcome: Progressing Goal: Respiratory complications will improve Outcome: Progressing

## 2022-12-01 NOTE — Progress Notes (Signed)
Mobility Specialist Progress Note:    12/01/22 1015  Mobility  Activity Ambulated with assistance in room  Level of Assistance Standby assist, set-up cues, supervision of patient - no hands on  Assistive Device Cane  Distance Ambulated (ft) 10 ft  Range of Motion/Exercises Active;All extremities  Activity Response Tolerated well  Mobility Referral Yes  $Mobility charge 1 Mobility  Mobility Specialist Start Time (ACUTE ONLY) 1005  Mobility Specialist Stop Time (ACUTE ONLY) 1015  Mobility Specialist Time Calculation (min) (ACUTE ONLY) 10 min   Pt received sitting EOB with wife, agreeable to mobility. Required SBA to stand and ambulate with cane. Tolerated well, baseline SpO2 94% on RA at rest. SpO2 93% on RA standing and during ambulation. During ambulation, HR was 25-30 BPM. Returned pt sitting EOB, recovered HR to 85 BPM. Pt denies any symptoms. Left pt sitting EOB, notified nurse. All needs met.   Lawerance Bach Mobility Specialist Please contact via Special educational needs teacher or  Rehab office at [phone]

## 2022-12-01 NOTE — Progress Notes (Signed)
Pt had great urine output overnight. NO acute events overnight. Kellogg RN

## 2022-12-01 NOTE — Progress Notes (Signed)
Progress Note  Patient Name: Tommy Knight Date of Encounter: 12/01/2022  Primary Cardiologist: Marjo Bicker, MD  Subjective   No acute events overnight, no symptoms.  Inpatient Medications    Scheduled Meds:  aspirin EC  81 mg Oral QHS   atorvastatin  40 mg Oral Daily   azithromycin  500 mg Oral Daily   clopidogrel  75 mg Oral Daily   guaiFENesin  600 mg Oral BID   heparin  5,000 Units Subcutaneous Q8H   ipratropium-albuterol  3 mL Nebulization Q6H   mometasone-formoterol  2 puff Inhalation BID   nicotine  21 mg Transdermal Daily   pantoprazole  40 mg Oral BID   potassium chloride  40 mEq Oral BID   Continuous Infusions:  PRN Meds: acetaminophen **OR** acetaminophen, albuterol, methocarbamol, morphine injection, oxyCODONE   Vital Signs    Vitals:   11/30/22 1947 11/30/22 2058 12/01/22 0225 12/01/22 0536  BP:  (!) 128/94  (!) 139/93  Pulse:  80  73  Resp:  20  18  Temp:  98.5 F (36.9 C)  97.8 F (36.6 C)  TempSrc:    Oral  SpO2: 93% 94% (!) 85% 96%  Weight:    82.4 kg  Height:        Intake/Output Summary (Last 24 hours) at 12/01/2022 1055 Last data filed at 12/01/2022 0830 Gross per 24 hour  Intake 480 ml  Output 4350 ml  Net -3870 ml   Filed Weights   11/28/22 0532 11/30/22 0408 12/01/22 0536  Weight: 87.7 kg 85.2 kg 82.4 kg    Telemetry     Personally reviewed, NSR  ECG    Not performed today  Physical Exam   GEN: No acute distress.   Neck: No JVD. Cardiac: RRR, no murmur, rub, or gallop.  Respiratory: Nonlabored. Clear to auscultation bilaterally. GI: Soft, nontender, bowel sounds present. MS: No edema; No deformity. Neuro:  Nonfocal. Psych: Alert and oriented x 3. Normal affect.  Labs    Chemistry Recent Labs  Lab 11/28/22 0443 11/30/22 0530 12/01/22 0425  NA 132* 135 134*  K 4.2 3.6 3.1*  CL 101 97* 93*  CO2 22 27 29   GLUCOSE 156* 104* 102*  BUN 30* 43* 45*  CREATININE 1.74* 1.67* 1.77*  CALCIUM 7.7* 8.1*  8.3*  PROT 5.9*  --   --   ALBUMIN 2.9*  --   --   AST 31  --   --   ALT 25  --   --   ALKPHOS 54  --   --   BILITOT 0.6  --   --   GFRNONAA 40* 42* 39*  ANIONGAP 9 11 12      Hematology Recent Labs  Lab 11/28/22 0443 11/30/22 0530 12/01/22 0425  WBC 6.1 12.7* 11.8*  RBC 4.70 4.75 5.27  HGB 13.9 14.2 15.3  HCT 41.4 42.2 46.2  MCV 88.1 88.8 87.7  MCH 29.6 29.9 29.0  MCHC 33.6 33.6 33.1  RDW 12.3 12.6 12.1  PLT 275 324 361    Cardiac Enzymes Recent Labs  Lab 11/27/22 1757 11/28/22 0443 11/28/22 0723  TROPONINIHS 74* 183* 149*    BNP Recent Labs  Lab 11/29/22 0440 11/30/22 0530 12/01/22 0425  BNP 2,458.0* 1,610.9* 2,197.0*     DDimerNo results for input(s): "DDIMER" in the last 168 hours.   Radiology    US Carotid Bilateral  Result Date: 11/30/2022 CLINICAL DATA:  [id number] Carotid artery stenosis [id number] Subclavian arterial stenosis (HCC)  [id number] EXAM: BILATERAL CAROTID DUPLEX ULTRASOUND TECHNIQUE: Wallace Cullens scale imaging, color Doppler and duplex ultrasound were performed of bilateral carotid and vertebral arteries in the neck. COMPARISON:  CT angiography head and neck August 22, 2017 FINDINGS: Criteria: Quantification of carotid stenosis is based on velocity parameters that correlate the residual internal carotid diameter with NASCET-based stenosis levels, using the diameter of the distal internal carotid lumen as the denominator for stenosis measurement. The following velocity measurements were obtained: RIGHT ICA: 120/13 cm/sec CCA: 73/4 cm/sec SYSTOLIC ICA/CCA RATIO:  1.6 ECA: 175 cm/sec LEFT ICA: 139/2 cm/sec CCA: 57/5 cm/sec SYSTOLIC ICA/CCA RATIO:  2.4 ECA: 151 cm/sec RIGHT CAROTID ARTERY: Scattered atherosclerotic plaque in the common carotid artery and more mild atherosclerotic plaque in the carotid bulb results in less than 50% stenosis by Doppler criteria. RIGHT VERTEBRAL ARTERY: Antegrade flow with dampened arterial waveforms. LEFT CAROTID ARTERY: Scattered  moderate atherosclerotic plaque in the common carotid artery and carotid bulb results in elevated velocities compatible with 50-69% stenosis by Doppler criteria. Relatively high resistance waveforms in the internal carotid artery are also noted. LEFT VERTEBRAL ARTERY:  Not visualized. IMPRESSION: 1. Mild atherosclerotic plaque of the right carotid bulb results in less than 50% stenosis by Doppler criteria. 2. Moderate atherosclerotic plaque of the left carotid circulation results in 50-69% stenosis by Doppler criteria. 3. Right vertebral artery demonstrates antegrade flow with dampened arterial waveforms, suggestive of more proximal stenosis. Comparison to CTA in 2019 demonstrated advanced atherosclerotic plaque involving the right subclavian artery. 4. Left vertebral artery is not visualized. 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 Electronically Signed   By: Olive Bass M.D.   On: 11/30/2022 15:20   ECHOCARDIOGRAM COMPLETE  Result Date: 11/29/2022    ECHOCARDIOGRAM REPORT   Patient Name:   Tommy Knight Date of Exam: 11/29/2022 Medical Rec #:  [id number]     Height:       71.0 in Accession #:    [id number]    Weight:       193.3 lb Date of Birth:  August 19, 1945     BSA:          2.078 m Patient Age:    77 years      BP:           148/84 mmHg Patient Gender: M             HR:           83 bpm. [facility] Procedure: 2D Echo, Cardiac Doppler and Color Doppler Indications:    SBE I33.9  History:        Patient has prior history of Echocardiogram examinations, most                 recent 08/23/2017. CHF, Stroke and COPD; Risk                 Factors:Hypertension, Dyslipidemia and Current Smoker.  Sonographer:    Celesta Gentile RCS Referring Phys: [phone] SEYED A SHAHMEHDI IMPRESSIONS  1. Left ventricular ejection fraction, by estimation, is 25 to 30%. The left ventricle has severely decreased function. The left ventricle demonstrates global hypokinesis.  Left ventricular diastolic parameters are consistent with Grade III diastolic dysfunction (restrictive). Elevated left atrial pressure.  2. Right ventricular systolic function is mildly reduced. The right ventricular size is normal. Tricuspid regurgitation signal is inadequate for assessing PA pressure.  3. Left atrial size was severely dilated.  4. Right  atrial size was mildly dilated.  5. The mitral valve is abnormal. Trivial mitral valve regurgitation. No evidence of mitral stenosis. Moderate mitral annular calcification.  6. The aortic valve was not well visualized but there appears to be severe restriction of the aortic valve leaflet opening. There is severe calcifcation of the aortic valve. Aortic valve regurgitation is not visualized. Aortic valve mean gradient measures 28.0 mmHg. Aortic valve Vmax measures 3.39 m/s. DVI is 0.21. Overall, this is likely severe aortic valve stenosis, low flow-low gradient in the setting of severely reduced LVEF.  7. The inferior vena cava is dilated in size with >50% respiratory variability, suggesting right atrial pressure of 8 mmHg. Comparison(s): No prior Echocardiogram. FINDINGS  Left Ventricle: Left ventricular ejection fraction, by estimation, is 25 to 30%. The left ventricle has severely decreased function. The left ventricle demonstrates global hypokinesis. The left ventricular internal cavity size was normal in size. There is no left ventricular hypertrophy. Left ventricular diastolic parameters are consistent with Grade III diastolic dysfunction (restrictive). Elevated left atrial pressure. Right Ventricle: The right ventricular size is normal. No increase in right ventricular wall thickness. Right ventricular systolic function is mildly reduced. Tricuspid regurgitation signal is inadequate for assessing PA pressure. Left Atrium: Left atrial size was severely dilated. Right Atrium: Right atrial size was mildly dilated. Pericardium: Trivial pericardial effusion is  present. Mitral Valve: The mitral valve is abnormal. Moderate mitral annular calcification. Trivial mitral valve regurgitation. No evidence of mitral valve stenosis. Tricuspid Valve: The tricuspid valve is normal in structure. Tricuspid valve regurgitation is not demonstrated. No evidence of tricuspid stenosis. Aortic Valve: The aortic valve was not well visualized. There is severe calcifcation of the aortic valve. Aortic valve regurgitation is not visualized. Moderate aortic stenosis is present. Aortic valve mean gradient measures 28.0 mmHg. Aortic valve peak gradient measures 45.9 mmHg. Aortic valve area, by VTI measures 0.66 cm. Pulmonic Valve: The pulmonic valve was not well visualized. Pulmonic valve regurgitation is not visualized. No evidence of pulmonic stenosis. Aorta: The aortic root is normal in size and structure. Venous: The inferior vena cava is dilated in size with greater than 50% respiratory variability, suggesting right atrial pressure of 8 mmHg. IAS/Shunts: No atrial level shunt detected by color flow Doppler.  LEFT VENTRICLE PLAX 2D LVIDd:         5.70 cm      Diastology LVIDs:         4.50 cm      LV e' medial:    5.33 cm/s LV PW:         1.10 cm      LV E/e' medial:  25.0 LV IVS:        1.20 cm      LV e' lateral:   7.07 cm/s LVOT diam:     2.00 cm      LV E/e' lateral: 18.8 LV SV:         49 LV SV Index:   24 LVOT Area:     3.14 cm  LV Volumes (MOD) LV vol d, MOD A2C: 132.0 ml LV vol d, MOD A4C: 148.0 ml LV vol s, MOD A2C: 89.2 ml LV vol s, MOD A4C: 88.4 ml LV SV MOD A2C:     42.8 ml LV SV MOD A4C:     148.0 ml LV SV MOD BP:      54.2 ml RIGHT VENTRICLE RV S prime:     11.60 cm/s TAPSE (M-mode): 1.5 cm LEFT ATRIUM  Index        RIGHT ATRIUM           Index LA diam:        4.70 cm  2.26 cm/m   RA Area:     20.00 cm LA Vol (A2C):   108.0 ml 51.96 ml/m  RA Volume:   64.00 ml  30.79 ml/m LA Vol (A4C):   123.0 ml 59.18 ml/m LA Biplane Vol: 116.0 ml 55.81 ml/m  AORTIC VALVE AV  Area (Vmax):    0.67 cm AV Area (Vmean):   0.67 cm AV Area (VTI):     0.66 cm AV Vmax:           338.67 cm/s AV Vmean:          244.250 cm/s AV VTI:            0.742 m AV Peak Grad:      45.9 mmHg AV Mean Grad:      28.0 mmHg LVOT Vmax:         72.50 cm/s LVOT Vmean:        52.400 cm/s LVOT VTI:          0.156 m LVOT/AV VTI ratio: 0.21  AORTA Ao Root diam: 3.30 cm MITRAL VALVE MV Area (PHT): 5.54 cm     SHUNTS MV Decel Time: 137 msec     Systemic VTI:  0.16 m MR Peak grad: 117.9 mmHg    Systemic Diam: 2.00 cm MR Mean grad: 78.0 mmHg MR Vmax:      543.00 cm/s MR Vmean:     422.0 cm/s MV E velocity: 133.00 cm/s MV A velocity: 53.60 cm/s MV E/A ratio:  2.48 Ayron Fillinger Priya Grayce Budden Electronically signed by Winfield Rast Janasia Coverdale Signature Date/Time: 11/29/2022/4:14:00 PM    Final      Assessment & Plan   Acute hypoxic respiratory failure likely secondary to acute diastolic heart failure versus possible pneumonia Acute systolic heart failure exacerbation New onset cardiomyopathy LVEF 25 to 30%, unclear etiology Possible severe aortic valve stenosis (Mean PG 28 mmHg, V-max 3.39 m/s, DVI 0.21 and severe restriction in the aortic valve opening, ESM with soft S2 on exam). 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 Serum creatinine stable, mildly increased from 1.67-1.77, IV Lasix on hold.  Will repeat BMP in the afternoon and start p.o. Lasix 20 mg once daily.  Start metoprolol 20 mg once daily and will start Entresto 24-26 mg twice daily after the BMP is resulted.  He will benefit from invasive ischemia evaluation with LHC and RHC due to new onset cardiomyopathy and possible severe aortic valve stenosis.  Hence he will need to be transferred to [facility], orders already placed for transfer.  He is volume optimized.  We discussed about smoking cessation and cutting back on the alcohol use.  He will need to follow-up with nephrology in the outpatient setting.  Informed consent for LHC and RHC The risks and benefits of the procedure were explained to the patient.  The risks include, but not limited to, bleeding, infection, aspiration pneumonia, contrast injury to the kidneys, stroke, death. Patient comprehended these risks and agreed to proceed with the procedure.  -Radial pulses intact bilaterally, needs outpatient vascular surgery  follow-up for subclavian artery stenosis. Currently on DAPT.   Signed, Marjo Bicker, MD  12/01/2022, 10:55 AM

## 2022-12-02 ENCOUNTER — Encounter (HOSPITAL_COMMUNITY)
Admission: EM | Disposition: A | Discharge status: Home / Self Care | Payer: Self-pay | Source: Home / Self Care | Attending: Family Medicine

## 2022-12-02 ENCOUNTER — Other Ambulatory Visit: Payer: Self-pay

## 2022-12-02 DIAGNOSIS — R6520 Severe sepsis without septic shock: Secondary | ICD-10-CM | POA: Diagnosis not present

## 2022-12-02 DIAGNOSIS — I6503 Occlusion and stenosis of bilateral vertebral arteries: Secondary | ICD-10-CM

## 2022-12-02 DIAGNOSIS — I5021 Acute systolic (congestive) heart failure: Secondary | ICD-10-CM

## 2022-12-02 DIAGNOSIS — I5023 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) heart failure: Secondary | ICD-10-CM | POA: Diagnosis not present

## 2022-12-02 DIAGNOSIS — I350 Nonrheumatic aortic (valve) stenosis: Secondary | ICD-10-CM | POA: Diagnosis not present

## 2022-12-02 DIAGNOSIS — I2510 Atherosclerotic heart disease of native coronary artery without angina pectoris: Secondary | ICD-10-CM

## 2022-12-02 DIAGNOSIS — A419 Sepsis, unspecified organism: Secondary | ICD-10-CM | POA: Diagnosis not present

## 2022-12-02 DIAGNOSIS — I771 Stricture of artery: Secondary | ICD-10-CM

## 2022-12-02 DIAGNOSIS — I6523 Occlusion and stenosis of bilateral carotid arteries: Secondary | ICD-10-CM

## 2022-12-02 DIAGNOSIS — J9601 Acute respiratory failure with hypoxia: Secondary | ICD-10-CM | POA: Diagnosis not present

## 2022-12-02 HISTORY — PX: RIGHT/LEFT HEART CATH AND CORONARY ANGIOGRAPHY: CATH118266

## 2022-12-02 LAB — POCT I-STAT EG7
Acid-Base Excess: 3 mmol/L — ABNORMAL HIGH (ref 0.0–2.0)
Acid-Base Excess: 3 mmol/L — ABNORMAL HIGH (ref 0.0–2.0)
Bicarbonate: 28.7 mmol/L — ABNORMAL HIGH (ref 20.0–28.0)
Bicarbonate: 29.3 mmol/L — ABNORMAL HIGH (ref 20.0–28.0)
Calcium, Ion: 1.11 mmol/L — ABNORMAL LOW (ref 1.15–1.40)
Calcium, Ion: 1.12 mmol/L — ABNORMAL LOW (ref 1.15–1.40)
HCT: 45 % (ref 39.0–52.0)
HCT: 45 % (ref 39.0–52.0)
Hemoglobin: 15.3 g/dL (ref 13.0–17.0)
Hemoglobin: 15.3 g/dL (ref 13.0–17.0)
O2 Saturation: 60 %
O2 Saturation: 65 %
Potassium: 4 mmol/L (ref 3.5–5.1)
Potassium: 4 mmol/L (ref 3.5–5.1)
Sodium: 138 mmol/L (ref 135–145)
Sodium: 138 mmol/L (ref 135–145)
TCO2: 30 mmol/L (ref 22–32)
TCO2: 31 mmol/L (ref 22–32)
pCO2, Ven: 45 mmHg (ref 44–60)
pCO2, Ven: 47.6 mmHg (ref 44–60)
pH, Ven: 7.398 (ref 7.25–7.43)
pH, Ven: 7.412 (ref 7.25–7.43)
pO2, Ven: 31 mmHg — CL (ref 32–45)
pO2, Ven: 34 mmHg (ref 32–45)

## 2022-12-02 LAB — BASIC METABOLIC PANEL
Anion gap: 12 (ref 5–15)
BUN: 44 mg/dL — ABNORMAL HIGH (ref 8–23)
CO2: 26 mmol/L (ref 22–32)
Calcium: 7.9 mg/dL — ABNORMAL LOW (ref 8.9–10.3)
Chloride: 96 mmol/L — ABNORMAL LOW (ref 98–111)
Creatinine, Ser: 1.71 mg/dL — ABNORMAL HIGH (ref 0.61–1.24)
GFR, Estimated: 40 mL/min — ABNORMAL LOW (ref >60)
Glucose, Bld: 98 mg/dL (ref 70–99)
Potassium: 3.3 mmol/L — ABNORMAL LOW (ref 3.5–5.1)
Sodium: 134 mmol/L — ABNORMAL LOW (ref 135–145)

## 2022-12-02 LAB — POCT I-STAT 7, (LYTES, BLD GAS, ICA,H+H)
Acid-Base Excess: 4 mmol/L — ABNORMAL HIGH (ref 0.0–2.0)
Bicarbonate: 28.2 mmol/L — ABNORMAL HIGH (ref 20.0–28.0)
Calcium, Ion: 1.12 mmol/L — ABNORMAL LOW (ref 1.15–1.40)
HCT: 45 % (ref 39.0–52.0)
Hemoglobin: 15.3 g/dL (ref 13.0–17.0)
O2 Saturation: 91 %
Potassium: 4 mmol/L (ref 3.5–5.1)
Sodium: 138 mmol/L (ref 135–145)
TCO2: 29 mmol/L (ref 22–32)
pCO2 arterial: 39.7 mmHg (ref 32–48)
pH, Arterial: 7.459 — ABNORMAL HIGH (ref 7.35–7.45)
pO2, Arterial: 58 mmHg — ABNORMAL LOW (ref 83–108)

## 2022-12-02 LAB — MAGNESIUM: Magnesium: 1.7 mg/dL (ref 1.7–2.4)

## 2022-12-02 LAB — BRAIN NATRIURETIC PEPTIDE: B Natriuretic Peptide: 1087 pg/mL — ABNORMAL HIGH (ref 0.0–100.0)

## 2022-12-02 LAB — CULTURE, BLOOD (SINGLE): Culture: NO GROWTH

## 2022-12-02 LAB — CBC
HCT: 46.9 % (ref 39.0–52.0)
Hemoglobin: 15.1 g/dL (ref 13.0–17.0)
MCH: 28.9 pg (ref 26.0–34.0)
MCHC: 32.2 g/dL (ref 30.0–36.0)
MCV: 89.7 fL (ref 80.0–100.0)
Platelets: 326 10*3/uL (ref 150–400)
RBC: 5.23 MIL/uL (ref 4.22–5.81)
RDW: 12.3 % (ref 11.5–15.5)
WBC: 12.6 10*3/uL — ABNORMAL HIGH (ref 4.0–10.5)
nRBC: 0 % (ref 0.0–0.2)

## 2022-12-02 LAB — POCT ACTIVATED CLOTTING TIME: Activated Clotting Time: 177 seconds

## 2022-12-02 SURGERY — RIGHT/LEFT HEART CATH AND CORONARY ANGIOGRAPHY
Anesthesia: LOCAL

## 2022-12-02 MED ORDER — SODIUM CHLORIDE 0.9% FLUSH
3.0000 mL | Freq: Two times a day (BID) | INTRAVENOUS | Status: DC
  Administered 2022-12-03: 3 mL via INTRAVENOUS

## 2022-12-02 MED ORDER — SODIUM CHLORIDE 0.9 % IV SOLN: 250.0000 mL | INTRAVENOUS | Status: DC | PRN

## 2022-12-02 MED ORDER — LABETALOL HCL 5 MG/ML IV SOLN: 10.0000 mg | INTRAVENOUS | Status: AC | PRN

## 2022-12-02 MED ORDER — MIDAZOLAM HCL 2 MG/2ML IJ SOLN
INTRAMUSCULAR | Status: DC | PRN
  Administered 2022-12-02: 1 mg via INTRAVENOUS

## 2022-12-02 MED ORDER — SODIUM CHLORIDE 0.9% FLUSH: 3.0000 mL | INTRAVENOUS | Status: DC | PRN

## 2022-12-02 MED ORDER — LIDOCAINE HCL (PF) 1 % IJ SOLN
INTRAMUSCULAR | Status: DC | PRN
  Administered 2022-12-02: 4 mL
  Administered 2022-12-02 (×2): 10 mL

## 2022-12-02 MED ORDER — HEPARIN SODIUM (PORCINE) 1000 UNIT/ML IJ SOLN
INTRAMUSCULAR | Status: AC
  Filled 2022-12-02: qty 10

## 2022-12-02 MED ORDER — SODIUM CHLORIDE 0.9 % IV SOLN: INTRAVENOUS | Status: DC

## 2022-12-02 MED ORDER — IPRATROPIUM-ALBUTEROL 0.5-2.5 (3) MG/3ML IN SOLN
RESPIRATORY_TRACT | Status: AC
  Filled 2022-12-02: qty 3

## 2022-12-02 MED ORDER — VERAPAMIL HCL 2.5 MG/ML IV SOLN
INTRAVENOUS | Status: AC
  Filled 2022-12-02: qty 2

## 2022-12-02 MED ORDER — POTASSIUM CHLORIDE CRYS ER 20 MEQ PO TBCR
40.0000 meq | EXTENDED_RELEASE_TABLET | Freq: Once | ORAL | Status: AC
  Administered 2022-12-02: 40 meq via ORAL
  Filled 2022-12-02: qty 2

## 2022-12-02 MED ORDER — ASPIRIN 81 MG PO TBEC: 81.0000 mg | DELAYED_RELEASE_TABLET | Freq: Every day | ORAL | Status: DC

## 2022-12-02 MED ORDER — FENTANYL CITRATE (PF) 100 MCG/2ML IJ SOLN
INTRAMUSCULAR | Status: DC | PRN
  Administered 2022-12-02: 25 ug via INTRAVENOUS

## 2022-12-02 MED ORDER — ONDANSETRON HCL 4 MG/2ML IJ SOLN: 4.0000 mg | Freq: Four times a day (QID) | INTRAMUSCULAR | Status: DC | PRN

## 2022-12-02 MED ORDER — HEPARIN (PORCINE) IN NACL 1000-0.9 UT/500ML-% IV SOLN
INTRAVENOUS | Status: DC | PRN
  Administered 2022-12-02 (×4): 500 mL

## 2022-12-02 MED ORDER — LIDOCAINE HCL (PF) 1 % IJ SOLN
INTRAMUSCULAR | Status: AC
  Filled 2022-12-02: qty 30

## 2022-12-02 MED ORDER — HEPARIN SODIUM (PORCINE) 1000 UNIT/ML IJ SOLN
INTRAMUSCULAR | Status: DC | PRN
  Administered 2022-12-02: 5000 [IU] via INTRAVENOUS

## 2022-12-02 MED ORDER — SODIUM CHLORIDE 0.9 % IV SOLN: INTRAVENOUS | Status: AC

## 2022-12-02 MED ORDER — ALBUTEROL SULFATE (2.5 MG/3ML) 0.083% IN NEBU
INHALATION_SOLUTION | RESPIRATORY_TRACT | Status: AC
  Filled 2022-12-02: qty 3

## 2022-12-02 MED ORDER — MIDAZOLAM HCL 2 MG/2ML IJ SOLN
INTRAMUSCULAR | Status: AC
  Filled 2022-12-02: qty 2

## 2022-12-02 MED ORDER — IOHEXOL 350 MG/ML SOLN
INTRAVENOUS | Status: DC | PRN
  Administered 2022-12-02: 55 mL

## 2022-12-02 MED ORDER — FENTANYL CITRATE (PF) 100 MCG/2ML IJ SOLN
INTRAMUSCULAR | Status: AC
  Filled 2022-12-02: qty 2

## 2022-12-02 MED ORDER — ACETAMINOPHEN 325 MG PO TABS: 650.0000 mg | ORAL_TABLET | ORAL | Status: DC | PRN

## 2022-12-02 MED ORDER — HYDRALAZINE HCL 20 MG/ML IJ SOLN: 10.0000 mg | INTRAMUSCULAR | Status: AC | PRN

## 2022-12-02 MED ORDER — SODIUM CHLORIDE 0.9 % IV BOLUS
INTRAVENOUS | Status: AC | PRN
  Administered 2022-12-02: 150 mL via INTRAVENOUS

## 2022-12-02 MED ORDER — ASPIRIN 81 MG PO CHEW: 81.0000 mg | CHEWABLE_TABLET | ORAL | Status: DC

## 2022-12-02 MED ORDER — VERAPAMIL HCL 2.5 MG/ML IV SOLN: INTRAVENOUS | Status: DC | PRN

## 2022-12-02 SURGICAL SUPPLY — 19 items
CATH 5FR PIGTAIL DIAGNOSTIC (CATHETERS) ×1
CATH BALLN WEDGE 5F 110CM (CATHETERS) ×1
CATH INFINITI 5FR JL4 (CATHETERS) ×1
CATH INFINITI AMBI 6FR TG (CATHETERS) ×1
CATH INFINITI JR4 5F (CATHETERS) ×1
DEVICE RAD COMP TR BAND LRG (VASCULAR PRODUCTS) ×1
GLIDESHEATH SLEND SS 6F .021 (SHEATH) ×1
GUIDEWIRE ANGLED .035X150CM (WIRE) ×1
KIT HEART LEFT (KITS) ×1
PACK CARDIAC CATHETERIZATION (CUSTOM PROCEDURE TRAY) ×1
SHEATH GLIDE SLENDER 4/5FR (SHEATH) ×1
SHEATH PINNACLE 5F 10CM (SHEATH) ×1
SHEATH PROBE COVER 6X72 (BAG) ×1
TRANSDUCER W/STOPCOCK (MISCELLANEOUS) ×1
TUBING CIL FLEX 10 FLL-RA (TUBING) ×1
WIRE EMERALD 3MM-J .035X260CM (WIRE) ×1
WIRE HI TORQ VERSACORE-J 145CM (WIRE) ×1
WIRE MICRO SET SILHO 5FR 7 (SHEATH) ×1
WIRE MICROINTRODUCER 60CM (WIRE) ×2

## 2022-12-02 NOTE — Progress Notes (Signed)
PROGRESS NOTE    JAWORSKI ROUND  MWN:[id number] DOB: 09-04-1945 DOA: 11/27/2022 PCP: Erasmo Downer, NP   Brief Narrative:    Tommy Knight is a 77 y.o. male with medical history significant of Knight, Tommy Knight, Tommy Knight, Tommy Knight, Tommy Knight, Tommy Knight, Tommy Knight.  He was admitted with concerns for severe sepsis with possible pneumonia Tommy initially had some hypotension, but this has not been ruled out.  He was admitted with acute hypoxemic respiratory failure likely secondary to acute diastolic heart failure Tommy is now noted to have acute systolic heart failure exacerbation with new onset cardiomyopathy Tommy LVEF 25-30%.  He is currently awaiting cardiac catheterization which is now scheduled for this afternoon.  Assessment & Plan:   Principal Problem:   Severe sepsis (HCC) Active Problems:   Tommy Knight   AKI (acute kidney injury) (HCC)   Acute respiratory failure with hypoxia (HCC)   Tommy Knight (congestive heart failure) (HCC)   Tommy Knight   Essential hypertension   Elevated troponin   Tommy Knight (chronic obstructive pulmonary disease) (HCC)  Assessment Tommy Plan:  Acute hypoxemic respiratory failure secondary to acute systolic Tommy Knight exacerbation with new onset cardiomyopathy -LVEF 25-30% with unclear etiology -Noted to have possible severe aortic valve stenosis -Awaiting cardiac catheterization with RHC/LHC upon transfer to [facility] this afternoon -Cardiology following -Plan to keep n.p.o. after midnight -Oral Lasix resumed per cardiology recommendations.  May start beta-blockers Tommy Entresto per cardiology  Tommy Knight (chronic obstructive pulmonary disease) (HCC) - Resolved weaned off 3 L of oxygen, currently on room air satting 92% - Continue Advair, Singulair - Initially was on some antibiotics along with azithromycin Tommy steroids -Respiratory status has improved considerably, continue breathing treatments as needed Tommy  continue to monitor -Wean oxygen status to room air as tolerated     Elevated troponin likely related to demand ischemia -Plan for cardiac catheterization as described above   Essential hypertension Hypotensive - Holding Norvasc, benazepril, hydrochlorothiazide in the setting of - hypotension>>>  requiring Levophed -11/29/2022 discontinuing Levophed, starting midodrine -11/30/2022 - blood pressure stabilized, will discontinue midodrine  Mild hypokalemia -Secondary to diuresis with Lasix, replete Tommy reevaluate in a.m.   Tommy Knight - Continue statin    AKI (acute kidney injury) (HCC) - Creatinine at baseline 1.19 - Creatinine today 1.71 -Recheck BMP this afternoon Tommy hold further IV Lasix for now   Tommy Knight - Smokes a pack per day - Counseled on importance of cessation - Nicotine patch started    DVT prophylaxis:Heparin Code Status: Full Family Communication: Wife at bedside 9/18 Disposition Plan: Transfer to [facility] for cardiac cath Status is: Inpatient Remains inpatient appropriate because: Need for IV medications.  Consultants:  Cardiology  Procedures:  None  Antimicrobials:  Anti-infectives (From admission, onward)    Start     Dose/Rate Route Frequency Ordered Stop   11/28/22 1300  azithromycin (ZITHROMAX) tablet 500 mg  Status:  Discontinued        500 mg Oral Daily 11/28/22 1129 12/01/22 1315   11/27/22 1830  cefTRIAXone (ROCEPHIN) 2 g in sodium chloride 0.9 % 100 mL IVPB  Status:  Discontinued        2 g 200 mL/hr over 30 Minutes Intravenous Every 24 hours 11/27/22 1826 11/28/22 1129   11/27/22 1830  azithromycin (ZITHROMAX) 500 mg in sodium chloride 0.9 % 250 mL IVPB  Status:  Discontinued        500 mg 250 mL/hr over  60 Minutes Intravenous Every 24 hours 11/27/22 1826 11/28/22 1129       Subjective: Patient seen Tommy evaluated today with no new acute complaints or concerns. No acute concerns or events noted overnight.  He has diuresed  quite well Tommy denies any further shortness of breath or chest pain or other concerns.  Objective: Vitals:   12/01/22 2030 12/02/22 0235 12/02/22 0454 12/02/22 0500  BP: (!) 144/86  94/67   Pulse: 79  78   Resp: 20     Temp: 98.5 F (36.9 C)  97.9 F (36.6 C)   TempSrc:   Oral   SpO2: 96% 91% 92%   Weight:    81.1 kg  Height:        Intake/Output Summary (Last 24 hours) at 12/02/2022 0951 Last data filed at 12/02/2022 0830 Gross per 24 hour  Intake 300 ml  Output --  Net 300 ml   Filed Weights   11/30/22 0408 12/01/22 0536 12/02/22 0500  Weight: 85.2 kg 82.4 kg 81.1 kg    Examination:  General exam: Appears calm Tommy comfortable  Respiratory system: Clear to auscultation. Respiratory effort normal. \ Cardiovascular system: S1 & S2 heard, RRR.  Gastrointestinal system: Abdomen is soft Central nervous system: Alert Tommy awake Extremities: No edema Skin: No significant lesions noted Psychiatry: Flat affect.    Data Reviewed: I have personally reviewed following labs Tommy imaging studies  CBC: Recent Labs  Lab 11/27/22 1757 11/28/22 0443 11/30/22 0530 12/01/22 0425 12/02/22 0423  WBC 15.7* 6.1 12.7* 11.8* 12.6*  NEUTROABS 10.4* 5.6  --   --   --   HGB 15.5 13.9 14.2 15.3 15.1  HCT 46.3 41.4 42.2 46.2 46.9  MCV 90.1 88.1 88.8 87.7 89.7  PLT 413* 275 324 361 326   Basic Metabolic Panel: Recent Labs  Lab 11/28/22 0443 11/30/22 0530 12/01/22 0425 12/01/22 1531 12/02/22 0423  NA 132* 135 134* 134* 134*  K 4.2 3.6 3.1* 3.5 3.3*  CL 101 97* 93* 96* 96*  CO2 22 27 29 27 26   GLUCOSE 156* 104* 102* 148* 98  BUN 30* 43* 45* 45* 44*  CREATININE 1.74* 1.67* 1.77* 1.82* 1.71*  CALCIUM 7.7* 8.1* 8.3* 8.1* 7.9*  MG 1.8  --   --   --  1.7   GFR: Estimated Creatinine Clearance: 37.9 mL/min (A) (by C-G formula based on SCr of 1.71 mg/dL (H)). Liver Function Tests: Recent Labs  Lab 11/28/22 0443  AST 31  ALT 25  ALKPHOS 54  BILITOT 0.6  PROT 5.9*  ALBUMIN  2.9*   No results for input(s): "LIPASE", "AMYLASE" in the last 168 hours. No results for input(s): "AMMONIA" in the last 168 hours. Coagulation Profile: No results for input(s): "INR", "PROTIME" in the last 168 hours. Cardiac Enzymes: No results for input(s): "CKTOTAL", "CKMB", "CKMBINDEX", "TROPONINI" in the last 168 hours. BNP (last 3 results) No results for input(s): "PROBNP" in the last 8760 hours. HbA1C: No results for input(s): "HGBA1C" in the last 72 hours. CBG: No results for input(s): "GLUCAP" in the last 168 hours. Lipid Profile: Recent Labs    12/01/22 0425  CHOL 195  HDL 52  LDLCALC 121*  TRIG 111  CHOLHDL 3.8   Thyroid Function Tests: Recent Labs    11/30/22 0530  TSH 2.215   Anemia Panel: No results for input(s): "VITAMINB12", "FOLATE", "FERRITIN", "TIBC", "IRON", "RETICCTPCT" in the last 72 hours. Sepsis Labs: Recent Labs  Lab 11/27/22 1849 11/27/22 2140  PROCALCITON  --  <  0.10  LATICACIDVEN 1.0 1.4    Recent Results (from the past 240 hour(s))  Resp panel by RT-PCR (RSV, Flu A&B, Covid) Anterior Nasal Swab     Status: None   Collection Time: 11/27/22  6:02 PM   Specimen: Anterior Nasal Swab  Result Value Ref Range Status   SARS Coronavirus 2 by RT PCR NEGATIVE NEGATIVE Final    Comment: (NOTE) SARS-CoV-2 target nucleic acids are NOT DETECTED.  The SARS-CoV-2 RNA is generally detectable in upper respiratory specimens during the acute phase of infection. The lowest concentration of SARS-CoV-2 viral copies this assay can detect is 138 copies/mL. A negative result does not preclude SARS-Cov-2 infection Tommy should not be used as the sole basis for treatment or other patient management decisions. A negative result may occur with  improper specimen collection/handling, submission of specimen other than nasopharyngeal swab, presence of viral mutation(s) within the areas targeted by this assay, Tommy inadequate number of viral copies(<138 copies/mL). A  negative result must be combined with clinical observations, patient history, Tommy epidemiological information. The expected result is Negative.  Fact Sheet for Patients:  BloggerCourse.com  Fact Sheet for Healthcare Providers:  SeriousBroker.it  This test is no t yet approved or cleared by the [place] FDA Tommy  has been authorized for detection Tommy/or diagnosis of SARS-CoV-2 by FDA under an Emergency Use Authorization (EUA). This EUA will remain  in effect (meaning this test can be used) for the duration of the COVID-19 declaration under Section 564(b)(1) of the Act, 21 U.S.C.section 360bbb-3(b)(1), unless the authorization is terminated  or revoked sooner.       Influenza A by PCR NEGATIVE NEGATIVE Final   Influenza B by PCR NEGATIVE NEGATIVE Final    Comment: (NOTE) The Xpert Xpress SARS-CoV-2/FLU/RSV plus assay is intended as an aid in the diagnosis of influenza from Nasopharyngeal swab specimens Tommy should not be used as a sole basis for treatment. Nasal washings Tommy aspirates are unacceptable for Xpert Xpress SARS-CoV-2/FLU/RSV testing.  Fact Sheet for Patients: BloggerCourse.com  Fact Sheet for Healthcare Providers: SeriousBroker.it  This test is not yet approved or cleared by the [place] FDA Tommy has been authorized for detection Tommy/or diagnosis of SARS-CoV-2 by FDA under an Emergency Use Authorization (EUA). This EUA will remain in effect (meaning this test can be used) for the duration of the COVID-19 declaration under Section 564(b)(1) of the Act, 21 U.S.C. section 360bbb-3(b)(1), unless the authorization is terminated or revoked.     Resp Syncytial Virus by PCR NEGATIVE NEGATIVE Final    Comment: (NOTE) Fact Sheet for Patients: BloggerCourse.com  Fact Sheet for Healthcare  Providers: SeriousBroker.it  This test is not yet approved or cleared by the [place] FDA Tommy has been authorized for detection Tommy/or diagnosis of SARS-CoV-2 by FDA under an Emergency Use Authorization (EUA). This EUA will remain in effect (meaning this test can be used) for the duration of the COVID-19 declaration under Section 564(b)(1) of the Act, 21 U.S.C. section 360bbb-3(b)(1), unless the authorization is terminated or revoked.  Performed at [facility], [address], [place], [place] [address]   Culture, blood (single)     Status: None   Collection Time: 11/27/22  6:49 PM   Specimen: BLOOD  Result Value Ref Range Status   Specimen Description BLOOD BOTTLES DRAWN AEROBIC Tommy ANAEROBIC  Final   Special Requests   Final    BLOOD RIGHT ARM Blood Culture results may not be optimal due to an excessive volume of  blood received in culture bottles   Culture   Final    NO GROWTH 5 DAYS Performed at [facility], [address] [place], [place] [address]    Report Status 12/02/2022 FINAL  Final  MRSA Next Gen by PCR, Nasal     Status: None   Collection Time: 11/27/22  9:28 PM   Specimen: Nasal Mucosa; Nasal Swab  Result Value Ref Range Status   MRSA by PCR Next Gen NOT DETECTED NOT DETECTED Final    Comment: (NOTE) The GeneXpert MRSA Assay (FDA approved for NASAL specimens only), is one component of a comprehensive MRSA colonization surveillance program. It is not intended to diagnose MRSA infection nor to guide or monitor treatment for MRSA infections. Test performance is not FDA approved in patients less than 9 years old. Performed at [facility], [address] [place], [place] [address]          Radiology Studies: US Carotid Bilateral  Result Date: 11/30/2022 CLINICAL DATA:  67596 Carotid artery stenosis [id number] Subclavian arterial stenosis (HCC) 956387 EXAM: BILATERAL CAROTID DUPLEX ULTRASOUND TECHNIQUE: Wallace Cullens scale imaging, color  Doppler Tommy duplex ultrasound were performed of bilateral carotid Tommy vertebral arteries in the neck. COMPARISON:  CT angiography head Tommy neck August 22, 2017 FINDINGS: Criteria: Quantification of carotid stenosis is based on velocity parameters that correlate the residual internal carotid diameter with NASCET-based stenosis levels, using the diameter of the distal internal carotid lumen as the denominator for stenosis measurement. The following velocity measurements were obtained: RIGHT ICA: 120/13 cm/sec CCA: 73/4 cm/sec SYSTOLIC ICA/CCA RATIO:  1.6 ECA: 175 cm/sec LEFT ICA: 139/2 cm/sec CCA: 57/5 cm/sec SYSTOLIC ICA/CCA RATIO:  2.4 ECA: 151 cm/sec RIGHT CAROTID ARTERY: Scattered atherosclerotic plaque in the common carotid artery Tommy more mild atherosclerotic plaque in the carotid bulb results in less than 50% stenosis by Doppler criteria. RIGHT VERTEBRAL ARTERY: Antegrade flow with dampened arterial waveforms. LEFT CAROTID ARTERY: Scattered moderate atherosclerotic plaque in the common carotid artery Tommy carotid bulb results in elevated velocities compatible with 50-69% stenosis by Doppler criteria. Relatively high resistance waveforms in the internal carotid artery are also noted. LEFT VERTEBRAL ARTERY:  Not visualized. IMPRESSION: 1. Mild atherosclerotic plaque of the right carotid bulb results in less than 50% stenosis by Doppler criteria. 2. Moderate atherosclerotic plaque of the left carotid circulation results in 50-69% stenosis by Doppler criteria. 3. Right vertebral artery demonstrates antegrade flow with dampened arterial waveforms, suggestive of more proximal stenosis. Comparison to CTA in 2019 demonstrated advanced atherosclerotic plaque involving the right subclavian artery. 4. Left vertebral artery is not visualized. 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 Electronically Signed   By: Olive Bass M.D.   On: 11/30/2022 15:20        Scheduled  Meds:  aspirin EC  81 mg Oral QHS   atorvastatin  40 mg Oral Daily   clopidogrel  75 mg Oral Daily   guaiFENesin  600 mg Oral BID   heparin  5,000 Units Subcutaneous Q8H   ipratropium-albuterol  3 mL Nebulization Q6H   mometasone-formoterol  2 puff Inhalation BID   nicotine  21 mg Transdermal Daily   pantoprazole  40 mg Oral BID     LOS: 5 days    Time spent: 35 minutes    Rosslyn Pasion Hoover Brunette, DO [facility]  If 7PM-7AM, please contact night-coverage [email or web address] 12/02/2022, 9:51 AM

## 2022-12-02 NOTE — Progress Notes (Signed)
Progress Note  Patient Name: Tommy Knight Date of Encounter: 12/02/2022  Primary Cardiologist: Marjo Bicker, MD  Subjective   No acute events overnight, no symptoms.  Inpatient Medications    Scheduled Meds:  aspirin EC  81 mg Oral QHS   atorvastatin  40 mg Oral Daily   clopidogrel  75 mg Oral Daily   guaiFENesin  600 mg Oral BID   heparin  5,000 Units Subcutaneous Q8H   ipratropium-albuterol  3 mL Nebulization Q6H   mometasone-formoterol  2 puff Inhalation BID   nicotine  21 mg Transdermal Daily   pantoprazole  40 mg Oral BID   Continuous Infusions:  PRN Meds: acetaminophen **OR** acetaminophen, albuterol, methocarbamol, morphine injection, oxyCODONE   Vital Signs    Vitals:   12/01/22 2030 12/02/22 0235 12/02/22 0454 12/02/22 0500  BP: (!) 144/86  94/67   Pulse: 79  78   Resp: 20     Temp: 98.5 F (36.9 C)  97.9 F (36.6 C)   TempSrc:   Oral   SpO2: 96% 91% 92%   Weight:    81.1 kg  Height:        Intake/Output Summary (Last 24 hours) at 12/02/2022 1113 Last data filed at 12/02/2022 0830 Gross per 24 hour  Intake 300 ml  Output --  Net 300 ml   Filed Weights   11/30/22 0408 12/01/22 0536 12/02/22 0500  Weight: 85.2 kg 82.4 kg 81.1 kg    Telemetry     Personally reviewed, NSR  ECG    Not performed today  Physical Exam   GEN: No acute distress.   Neck: No JVD. Cardiac: RRR, no murmur, rub, or gallop.  Respiratory: Nonlabored. Clear to auscultation bilaterally. GI: Soft, nontender, bowel sounds present. MS: No edema; No deformity. Neuro:  Nonfocal. Psych: Alert and oriented x 3. Normal affect.  Labs    Chemistry Recent Labs  Lab 11/28/22 0443 11/30/22 0530 12/01/22 0425 12/01/22 1531 12/02/22 0423  NA 132*   < > 134* 134* 134*  K 4.2   < > 3.1* 3.5 3.3*  CL 101   < > 93* 96* 96*  CO2 22   < > 29 27 26   GLUCOSE 156*   < > 102* 148* 98  BUN 30*   < > 45* 45* 44*  CREATININE 1.74*   < > 1.77* 1.82* 1.71*  CALCIUM 7.7*    < > 8.3* 8.1* 7.9*  PROT 5.9*  --   --   --   --   ALBUMIN 2.9*  --   --   --   --   AST 31  --   --   --   --   ALT 25  --   --   --   --   ALKPHOS 54  --   --   --   --   BILITOT 0.6  --   --   --   --   GFRNONAA 40*   < > 39* 38* 40*  ANIONGAP 9   < > 12 11 12    < > = values in this interval not displayed.     Hematology Recent Labs  Lab 11/30/22 0530 12/01/22 0425 12/02/22 0423  WBC 12.7* 11.8* 12.6*  RBC 4.75 5.27 5.23  HGB 14.2 15.3 15.1  HCT 42.2 46.2 46.9  MCV 88.8 87.7 89.7  MCH 29.9 29.0 28.9  MCHC 33.6 33.1 32.2  RDW 12.6 12.1 12.3  PLT 324  361 326    Cardiac Enzymes Recent Labs  Lab 11/27/22 1757 11/28/22 0443 11/28/22 0723  TROPONINIHS 74* 183* 149*    BNP Recent Labs  Lab 11/30/22 0530 12/01/22 0425 12/02/22 0423  BNP 2,976.0* 2,197.0* 1,087.0*     DDimerNo results for input(s): "DDIMER" in the last 168 hours.   Radiology    No results found.   Assessment & Plan    Acute systolic heart failure exacerbation, compensated New onset cardiomyopathy LVEF 25 to 30%, unclear etiology Possible severe aortic valve stenosis (Mean PG 28 mmHg, V-max 3.39 m/s, DVI 0.21 and severe restriction in the aortic valve opening, ESM with soft S2 on exam). 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 Lasix 20 mg once daily now.  GDMT up titration/addition limited due to soft blood pressures.  We discussed about smoking cessation and alcohol use. 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 Currently on DAPT. He will benefit from invasive ischemia evaluation with LHC and RHC due to new onset  cardiomyopathy and possible severe aortic valve stenosis.  She is scheduled for the procedures today.  N.p.o. today.  Informed consent for LHC and RHC The risks and benefits of the procedure were explained to the patient.  The risks include, but not limited to, bleeding, infection, aspiration pneumonia, contrast injury to the kidneys, stroke, death. Patient comprehended these risks and agreed to proceed with the procedure.     Signed, Marjo Bicker, MD  12/02/2022, 11:13 AM

## 2022-12-02 NOTE — Progress Notes (Signed)
TR BAND REMOVAL  LOCATION:    radial rt radial site  DEFLATED PER PROTOCOL:   yes  TIME BAND OFF / DRESSING APPLIED:    1900/gauze and tegaderm  SITE UPON ARRIVAL:    Level 0  SITE AFTER BAND REMOVAL:    Level 0  CIRCULATION SENSATION AND MOVEMENT:    Within Normal Limits : yes, rt radial pulse palpable, rt hand and fingers warm and pink, good pleth waveform, sensation present  COMMENTS:

## 2022-12-02 NOTE — Progress Notes (Signed)
Site area: 35F rt groin arterial sheath Site Prior to Removal:  Level 0 Pressure Applied For: 25 minutes Manual:     yes Patient Status During Pull:  stable Post Pull Site:  Level 0 Post Pull Instructions Given:  yes Post Pull Pulses Present: rt dp palpable Dressing Applied:  gauze and tegaderm Bedrest begins @ 1715 Comments:

## 2022-12-02 NOTE — Progress Notes (Signed)
Report given to Lurena Joiner from [facility]

## 2022-12-03 ENCOUNTER — Encounter: Payer: Self-pay | Admitting: Physician Assistant

## 2022-12-03 ENCOUNTER — Other Ambulatory Visit: Payer: Self-pay | Admitting: Physician Assistant

## 2022-12-03 ENCOUNTER — Encounter (HOSPITAL_COMMUNITY): Payer: Self-pay | Admitting: Internal Medicine

## 2022-12-03 DIAGNOSIS — I5021 Acute systolic (congestive) heart failure: Secondary | ICD-10-CM | POA: Diagnosis not present

## 2022-12-03 DIAGNOSIS — A419 Sepsis, unspecified organism: Secondary | ICD-10-CM | POA: Diagnosis not present

## 2022-12-03 DIAGNOSIS — I350 Nonrheumatic aortic (valve) stenosis: Secondary | ICD-10-CM

## 2022-12-03 DIAGNOSIS — R6520 Severe sepsis without septic shock: Secondary | ICD-10-CM | POA: Diagnosis not present

## 2022-12-03 LAB — BASIC METABOLIC PANEL
Anion gap: 8 (ref 5–15)
BUN: 36 mg/dL — ABNORMAL HIGH (ref 8–23)
CO2: 25 mmol/L (ref 22–32)
Calcium: 8.3 mg/dL — ABNORMAL LOW (ref 8.9–10.3)
Chloride: 103 mmol/L (ref 98–111)
Creatinine, Ser: 1.78 mg/dL — ABNORMAL HIGH (ref 0.61–1.24)
GFR, Estimated: 39 mL/min — ABNORMAL LOW (ref >60)
Glucose, Bld: 142 mg/dL — ABNORMAL HIGH (ref 70–99)
Potassium: 3.8 mmol/L (ref 3.5–5.1)
Sodium: 136 mmol/L (ref 135–145)

## 2022-12-03 LAB — CBC
HCT: 47.4 % (ref 39.0–52.0)
Hemoglobin: 15.8 g/dL (ref 13.0–17.0)
MCH: 29.6 pg (ref 26.0–34.0)
MCHC: 33.3 g/dL (ref 30.0–36.0)
MCV: 88.9 fL (ref 80.0–100.0)
Platelets: 364 10*3/uL (ref 150–400)
RBC: 5.33 MIL/uL (ref 4.22–5.81)
RDW: 12.4 % (ref 11.5–15.5)
WBC: 12.7 10*3/uL — ABNORMAL HIGH (ref 4.0–10.5)
nRBC: 0 % (ref 0.0–0.2)

## 2022-12-03 LAB — MAGNESIUM: Magnesium: 1.7 mg/dL (ref 1.7–2.4)

## 2022-12-03 MED ORDER — IPRATROPIUM-ALBUTEROL 0.5-2.5 (3) MG/3ML IN SOLN: 3.0000 mL | Freq: Two times a day (BID) | RESPIRATORY_TRACT | Status: DC

## 2022-12-03 MED ORDER — ASPIRIN 81 MG PO TBEC: 81.0000 mg | DELAYED_RELEASE_TABLET | Freq: Every day | ORAL | 12 refills | Status: DC

## 2022-12-03 MED ORDER — NICOTINE 21 MG/24HR TD PT24: 21.0000 mg | MEDICATED_PATCH | Freq: Every day | TRANSDERMAL | 0 refills | Status: DC

## 2022-12-03 MED ORDER — FUROSEMIDE 20 MG PO TABS: 20.0000 mg | ORAL_TABLET | Freq: Every day | ORAL | 0 refills | Status: DC | PRN

## 2022-12-03 NOTE — Plan of Care (Signed)
  Problem: Education: Goal: Knowledge of General Education information will improve Description: Including pain rating scale, medication(s)/side effects and non-pharmacologic comfort measures Outcome: Progressing   Problem: Health Behavior/Discharge Planning: Goal: Ability to manage health-related needs will improve Outcome: Progressing   Problem: Clinical Measurements: Goal: Ability to maintain clinical measurements within normal limits will improve Outcome: Progressing Goal: Will remain free from infection Outcome: Progressing Goal: Diagnostic test results will improve Outcome: Progressing Goal: Respiratory complications will improve Outcome: Progressing Goal: Cardiovascular complication will be avoided Outcome: Progressing   Problem: Activity: Goal: Risk for activity intolerance will decrease Outcome: Progressing   Problem: Nutrition: Goal: Adequate nutrition will be maintained Outcome: Progressing   Problem: Coping: Goal: Level of anxiety will decrease Outcome: Progressing   Problem: Elimination: Goal: Will not experience complications related to bowel motility Outcome: Progressing Goal: Will not experience complications related to urinary retention Outcome: Progressing   Problem: Pain Managment: Goal: General experience of comfort will improve Outcome: Progressing   Problem: Safety: Goal: Ability to remain free from injury will improve Outcome: Progressing   Problem: Skin Integrity: Goal: Risk for impaired skin integrity will decrease Outcome: Progressing   Problem: Education: Goal: Knowledge of disease or condition will improve Outcome: Progressing Goal: Knowledge of the prescribed therapeutic regimen will improve Outcome: Progressing Goal: Individualized Educational Video(s) Outcome: Progressing   Problem: Activity: Goal: Ability to tolerate increased activity will improve Outcome: Progressing Goal: Will verbalize the importance of balancing  activity with adequate rest periods Outcome: Progressing   Problem: Respiratory: Goal: Ability to maintain a clear airway will improve Outcome: Progressing Goal: Levels of oxygenation will improve Outcome: Progressing Goal: Ability to maintain adequate ventilation will improve Outcome: Progressing   Problem: Activity: Goal: Ability to tolerate increased activity will improve Outcome: Progressing   Problem: Clinical Measurements: Goal: Ability to maintain a body temperature in the normal range will improve Outcome: Progressing   Problem: Respiratory: Goal: Ability to maintain adequate ventilation will improve Outcome: Progressing Goal: Ability to maintain a clear airway will improve Outcome: Progressing   Problem: Education: Goal: Understanding of CV disease, CV risk reduction, and recovery process will improve Outcome: Progressing Goal: Individualized Educational Video(s) Outcome: Progressing   Problem: Activity: Goal: Ability to return to baseline activity level will improve Outcome: Progressing   Problem: Cardiovascular: Goal: Ability to achieve and maintain adequate cardiovascular perfusion will improve Outcome: Progressing Goal: Vascular access site(s) Level 0-1 will be maintained Outcome: Progressing   Problem: Health Behavior/Discharge Planning: Goal: Ability to safely manage health-related needs after discharge will improve Outcome: Progressing

## 2022-12-03 NOTE — Care Management Important Message (Signed)
Important Message  Patient Details  Name: Tommy Knight MRN: [id number] Date of Birth: 1945-08-20   Medicare Important Message Given:  Yes     Sherilyn Banker 12/03/2022, 3:36 PM

## 2022-12-03 NOTE — Discharge Summary (Signed)
Physician Discharge Summary  Tommy Knight GMW:[id number] DOB: September 25, 1945 DOA: 11/27/2022  PCP: Erasmo Downer, NP  Admit date: 11/27/2022 Discharge date: 12/03/2022  Admitted From: Home Disposition: Home  Recommendations for Outpatient Follow-up:  Follow up with PCP in 1-2 weeks Follow up with cardiology as scheduled:  Discharge Condition: Stable CODE STATUS: Full Diet recommendation: Low-salt low-fat low-carb diet  Brief/Interim Summary: Tommy Knight is a 77 y.o. male with medical history significant of CVA, CHF, tobacco use disorder, hyperlipidemia, GERD, COPD, and more presents the ED with a chief complaint of dyspnea.  He was admitted with concerns for severe sepsis with possible pneumonia and initially had some hypotension, but this has not been ruled out.  He was admitted with acute hypoxemic respiratory failure likely secondary to acute diastolic heart failure and is now noted to have acute systolic heart failure exacerbation with new onset cardiomyopathy and LVEF 25-30%.    Patient admitted as above with acute hypoxic respiratory failure in setting of heart failure exacerbation and newly diagnosed cardiomyopathy, status post cardiac catheterization with mild to moderate nonobstructive coronary disease.  Appreciate cardiology insight recommendations.  Close outpatient follow-up with PCP and cardiology as scheduled.  Continue Lasix as needed per cardiology recommendations.  Unfortunately patient's blood pressure remains low unable to tolerate ACE ARB Arni, consider SGLT2 inhibitor at discharge if renal function is more appropriate.  At this time patient is otherwise stable and agreeable for discharge home, remainder of comorbid conditions appear to be well-controlled  Discharge Diagnoses:  Principal Problem:   Severe sepsis (HCC) Active Problems:   Tobacco use disorder   AKI (acute kidney injury) (HCC)   Acute respiratory failure with hypoxia (HCC)   CHF (congestive heart  failure) (HCC)   Hyperlipidemia   Essential hypertension   Elevated troponin   COPD (chronic obstructive pulmonary disease) (HCC)   Acute systolic heart failure (HCC)   Severe aortic stenosis   Bilateral carotid artery stenosis   Stenosis of both vertebral arteries   Subclavian artery stenosis (HCC)  Acute hypoxemic respiratory failure secondary to acute systolic CHF exacerbation with new onset cardiomyopathy Elevated troponin likely related to demand ischemia  COPD (chronic obstructive pulmonary disease) (HCC) -Without acute exacerbation     Essential hypertension -Hypotensive, discontinue Norvasc benazepril hydrochlorothiazide   Mild hypokalemia -Resolved, follow outpatient labs   Hyperlipidemia - Continue statin    AKI (acute kidney injury) (HCC) versus undiagnosed CKD, unknown stage - No recent labs, elevated creatinine presumed to be acute however this may be patient's baseline given stable creatinine here despite treatment   Tobacco use disorder - Smokes a pack per day - Counseled on importance of cessation - Nicotine patch recommended  Discharge Instructions   Allergies as of 12/03/2022   No Known Allergies      Medication List     TAKE these medications    albuterol 108 (90 Base) MCG/ACT inhaler Commonly known as: VENTOLIN HFA Inhale 2 puffs into the lungs every 4 (four) hours as needed for wheezing or shortness of breath.   aspirin EC 81 MG tablet Take 1 tablet (81 mg total) by mouth at bedtime. Swallow whole. What changed: additional instructions   atorvastatin 40 MG tablet Commonly known as: LIPITOR Take 40 mg by mouth every evening.   Centrum Silver 50+Men Tabs Take 1 tablet by mouth daily.   PreserVision AREDS 2 Caps Take 1 capsule by mouth 2 (two) times daily.   clopidogrel 75 MG tablet Commonly known as: PLAVIX Take 1  tablet (75 mg total) by mouth daily.   Fluticasone-Salmeterol 250-50 MCG/DOSE Aepb Commonly known as: ADVAIR Inhale  1 puff into the lungs 2 (two) times daily.   furosemide 20 MG tablet Commonly known as: Lasix Take 1 tablet (20 mg total) by mouth daily as needed (As directed per cardiology for volume overload).   ipratropium-albuterol 0.5-2.5 (3) MG/3ML Soln Commonly known as: DUONEB Take 3 mLs by nebulization every 6 (six) hours as needed (for shortness of breath).   levocetirizine 5 MG tablet Commonly known as: XYZAL Take 5 mg by mouth every evening.   montelukast 10 MG tablet Commonly known as: SINGULAIR Take 10 mg by mouth every evening.   nicotine 21 mg/24hr patch Commonly known as: NICODERM CQ - dosed in mg/24 hours Place 1 patch (21 mg total) onto the skin daily. Start taking on: December 04, 2022   omeprazole 20 MG capsule Commonly known as: PRILOSEC Take 20 mg by mouth daily.        No Known Allergies  Consultations: Cardiology  Procedures/Studies: CARDIAC CATHETERIZATION  Result Date: 12/02/2022   Dist LM lesion is 30% stenosed. 1.  Mild to moderate nonobstructive coronary artery disease. 2.  Severe bilateral common iliac stenoses, common femoral stenoses, and subclavian stenoses. 3.  Cardiac output of 5.0 L/min and cardiac index of 2.5 L/min/m with the following hemodynamics:  Right atrial pressure mean of 2 mmHg  Right ventricular pressure 27/1 with end-diastolic pressure of 1 mmHg  Wedge pressure mean of 5 mmHg  Pulmonary artery pressure 30/0 with a mean of 14 mmHg  PVR of 1.8 Woods units  PA pulsatility index of greater than 10 Recommendation: Medical therapy; will need evaluation for aortic valve intervention however severe obstructive peripheral vascular disease will limit this and may need to be addressed prior to any aortic valve intervention.   US Carotid Bilateral  Result Date: 11/30/2022 CLINICAL DATA:  67596 Carotid artery stenosis [id number] Subclavian arterial stenosis (HCC) [id number] EXAM: BILATERAL CAROTID DUPLEX ULTRASOUND TECHNIQUE: Wallace Cullens scale imaging, color  Doppler and duplex ultrasound were performed of bilateral carotid and vertebral arteries in the neck. COMPARISON:  CT angiography head and neck August 22, 2017 FINDINGS: Criteria: Quantification of carotid stenosis is based on velocity parameters that correlate the residual internal carotid diameter with NASCET-based stenosis levels, using the diameter of the distal internal carotid lumen as the denominator for stenosis measurement. The following velocity measurements were obtained: RIGHT ICA: 120/13 cm/sec CCA: 73/4 cm/sec SYSTOLIC ICA/CCA RATIO:  1.6 ECA: 175 cm/sec LEFT ICA: 139/2 cm/sec CCA: 57/5 cm/sec SYSTOLIC ICA/CCA RATIO:  2.4 ECA: 151 cm/sec RIGHT CAROTID ARTERY: Scattered atherosclerotic plaque in the common carotid artery and more mild atherosclerotic plaque in the carotid bulb results in less than 50% stenosis by Doppler criteria. RIGHT VERTEBRAL ARTERY: Antegrade flow with dampened arterial waveforms. LEFT CAROTID ARTERY: Scattered moderate atherosclerotic plaque in the common carotid artery and carotid bulb results in elevated velocities compatible with 50-69% stenosis by Doppler criteria. Relatively high resistance waveforms in the internal carotid artery are also noted. LEFT VERTEBRAL ARTERY:  Not visualized. IMPRESSION: 1. Mild atherosclerotic plaque of the right carotid bulb results in less than 50% stenosis by Doppler criteria. 2. Moderate atherosclerotic plaque of the left carotid circulation results in 50-69% stenosis by Doppler criteria. 3. Right vertebral artery demonstrates antegrade flow with dampened arterial waveforms, suggestive of more proximal stenosis. Comparison to CTA in 2019 demonstrated advanced atherosclerotic plaque involving the right subclavian artery. 4. Left vertebral artery is not visualized.  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 Electronically Signed   By: Olive Bass M.D.   On: 11/30/2022 15:20   ECHOCARDIOGRAM  COMPLETE  Result Date: 11/29/2022    ECHOCARDIOGRAM REPORT   Patient Name:   Tommy Knight Date of Exam: 11/29/2022 Medical Rec #:  [id number]     Height:       71.0 in Accession #:    [id number]    Weight:       193.3 lb Date of Birth:  30-Aug-1945     BSA:          2.078 m Patient Age:    77 years      BP:           148/84 mmHg Patient Gender: M             HR:           83 bpm. [facility] Procedure: 2D Echo, Cardiac Doppler and Color Doppler Indications:    SBE I33.9  History:        Patient has prior history of Echocardiogram examinations, most                 recent 08/23/2017. CHF, Stroke and COPD; Risk                 Factors:Hypertension, Dyslipidemia and Current Smoker.  Sonographer:    Celesta Gentile RCS Referring Phys: [phone] SEYED A SHAHMEHDI IMPRESSIONS  1. Left ventricular ejection fraction, by estimation, is 25 to 30%. The left ventricle has severely decreased function. The left ventricle demonstrates global hypokinesis. Left ventricular diastolic parameters are consistent with Grade III diastolic dysfunction (restrictive). Elevated left atrial pressure.  2. Right ventricular systolic function is mildly reduced. The right ventricular size is normal. Tricuspid regurgitation signal is inadequate for assessing PA pressure.  3. Left atrial size was severely dilated.  4. Right atrial size was mildly dilated.  5. The mitral valve is abnormal. Trivial mitral valve regurgitation. No evidence of mitral stenosis. Moderate mitral annular calcification.  6. The aortic valve was not well visualized but there appears to be severe restriction of the aortic valve leaflet opening. There is severe calcifcation of the aortic valve. Aortic valve regurgitation is not visualized. Aortic valve mean gradient measures 28.0 mmHg. Aortic valve Vmax measures 3.39 m/s. DVI is 0.21. Overall, this is likely severe aortic valve stenosis, low flow-low gradient in the setting of severely reduced LVEF.  7. The inferior  vena cava is dilated in size with >50% respiratory variability, suggesting right atrial pressure of 8 mmHg. Comparison(s): No prior Echocardiogram. FINDINGS  Left Ventricle: Left ventricular ejection fraction, by estimation, is 25 to 30%. The left ventricle has severely decreased function. The left ventricle demonstrates global hypokinesis. The left ventricular internal cavity size was normal in size. There is no left ventricular hypertrophy. Left ventricular diastolic parameters are consistent with Grade III diastolic dysfunction (restrictive). Elevated left atrial pressure. Right Ventricle: The right ventricular size is normal. No increase in right ventricular wall thickness. Right ventricular systolic function is mildly reduced. Tricuspid regurgitation signal is inadequate for assessing PA pressure. Left Atrium: Left atrial size was severely dilated. Right Atrium: Right atrial size was mildly dilated. Pericardium: Trivial pericardial effusion is present. Mitral Valve: The mitral valve is abnormal. Moderate mitral annular calcification. Trivial mitral valve regurgitation. No evidence of mitral valve stenosis. Tricuspid Valve: The tricuspid valve is normal in structure. Tricuspid  valve regurgitation is not demonstrated. No evidence of tricuspid stenosis. Aortic Valve: The aortic valve was not well visualized. There is severe calcifcation of the aortic valve. Aortic valve regurgitation is not visualized. Moderate aortic stenosis is present. Aortic valve mean gradient measures 28.0 mmHg. Aortic valve peak gradient measures 45.9 mmHg. Aortic valve area, by VTI measures 0.66 cm. Pulmonic Valve: The pulmonic valve was not well visualized. Pulmonic valve regurgitation is not visualized. No evidence of pulmonic stenosis. Aorta: The aortic root is normal in size and structure. Venous: The inferior vena cava is dilated in size with greater than 50% respiratory variability, suggesting right atrial pressure of 8 mmHg.  IAS/Shunts: No atrial level shunt detected by color flow Doppler.  LEFT VENTRICLE PLAX 2D LVIDd:         5.70 cm      Diastology LVIDs:         4.50 cm      LV e' medial:    5.33 cm/s LV PW:         1.10 cm      LV E/e' medial:  25.0 LV IVS:        1.20 cm      LV e' lateral:   7.07 cm/s LVOT diam:     2.00 cm      LV E/e' lateral: 18.8 LV SV:         49 LV SV Index:   24 LVOT Area:     3.14 cm  LV Volumes (MOD) LV vol d, MOD A2C: 132.0 ml LV vol d, MOD A4C: 148.0 ml LV vol s, MOD A2C: 89.2 ml LV vol s, MOD A4C: 88.4 ml LV SV MOD A2C:     42.8 ml LV SV MOD A4C:     148.0 ml LV SV MOD BP:      54.2 ml RIGHT VENTRICLE RV S prime:     11.60 cm/s TAPSE (M-mode): 1.5 cm LEFT ATRIUM              Index        RIGHT ATRIUM           Index LA diam:        4.70 cm  2.26 cm/m   RA Area:     20.00 cm LA Vol (A2C):   108.0 ml 51.96 ml/m  RA Volume:   64.00 ml  30.79 ml/m LA Vol (A4C):   123.0 ml 59.18 ml/m LA Biplane Vol: 116.0 ml 55.81 ml/m  AORTIC VALVE AV Area (Vmax):    0.67 cm AV Area (Vmean):   0.67 cm AV Area (VTI):     0.66 cm AV Vmax:           338.67 cm/s AV Vmean:          244.250 cm/s AV VTI:            0.742 m AV Peak Grad:      45.9 mmHg AV Mean Grad:      28.0 mmHg LVOT Vmax:         72.50 cm/s LVOT Vmean:        52.400 cm/s LVOT VTI:          0.156 m LVOT/AV VTI ratio: 0.21  AORTA Ao Root diam: 3.30 cm MITRAL VALVE MV Area (PHT): 5.54 cm     SHUNTS MV Decel Time: 137 msec     Systemic VTI:  0.16 m MR Peak grad: 117.9 mmHg    Systemic  Diam: 2.00 cm MR Mean grad: 78.0 mmHg MR Vmax:      543.00 cm/s MR Vmean:     422.0 cm/s MV E velocity: 133.00 cm/s MV A velocity: 53.60 cm/s MV E/A ratio:  2.48 Vishnu Priya Mallipeddi Electronically signed by Winfield Rast Mallipeddi Signature Date/Time: 11/29/2022/4:14:00 PM    Final    DG CHEST PORT 1 VIEW  Result Date: 11/28/2022 CLINICAL DATA:  Shortness of breath, concern for sepsis. EXAM: PORTABLE CHEST 1 VIEW COMPARISON:  Chest radiograph dated 11/27/2022.  FINDINGS: The heart is borderline enlarged. Vascular calcifications are seen in the aortic arch. Mild left basilar atelectasis/airspace disease appears decreased compared to prior exam. Mild diffuse bilateral interstitial opacities appear decreased. No significant pleural effusion or pneumothorax on either side. Degenerative changes are seen in the spine. IMPRESSION: Decreased mild left basilar atelectasis/airspace disease and mild diffuse bilateral interstitial opacities. Electronically Signed   By: Romona Curls M.D.   On: 11/28/2022 09:21   DG Chest Portable 1 View  Result Date: 11/27/2022 CLINICAL DATA:  Shortness of Breath EXAM: PORTABLE CHEST - 1 VIEW COMPARISON:  04/07/2011. FINDINGS: Cardiac silhouette is prominent. There is pulmonary interstitial prominence with vascular congestion. Alveolar consolidation in the left base consistent with pneumonia and left-sided moderate pleural effusion. Small pleural effusion on the right. No pneumothorax. Aorta is calcified. There are thoracic degenerative changes. IMPRESSION: Findings suggest CHF. Left basilar consolidation and effusion consistent with pneumonia. Electronically Signed   By: Layla Maw M.D.   On: 11/27/2022 18:03     Subjective: No acute issues or events overnight denies nausea vomiting diarrhea constipation fevers chills chest pain   Discharge Exam: Vitals:   12/03/22 0803 12/03/22 0817  BP: (!) 89/78   Pulse: 94 78  Resp: 18 18  Temp: 97.8 F (36.6 C)   SpO2: 93% 93%   Vitals:   12/02/22 2246 12/03/22 0409 12/03/22 0803 12/03/22 0817  BP: 97/82 98/81 (!) 89/78   Pulse: 82 74 94 78  Resp: 20 19 18 18   Temp: 97.9 F (36.6 C) 97.7 F (36.5 C) 97.8 F (36.6 C)   TempSrc: Oral Oral Oral   SpO2: 93% 93% 93% 93%  Weight:  80.1 kg    Height:        General: Pt is alert, awake, not in acute distress Cardiovascular: RRR, S1/S2 +, no rubs, no gallops Respiratory: CTA bilaterally, no wheezing, no rhonchi Abdominal: Soft,  NT, ND, bowel sounds + Extremities: no edema, no cyanosis    The results of significant diagnostics from this hospitalization (including imaging, microbiology, ancillary and laboratory) are listed below for reference.     Microbiology: Recent Results (from the past 240 hour(s))  Resp panel by RT-PCR (RSV, Flu A&B, Covid) Anterior Nasal Swab     Status: None   Collection Time: 11/27/22  6:02 PM   Specimen: Anterior Nasal Swab  Result Value Ref Range Status   SARS Coronavirus 2 by RT PCR NEGATIVE NEGATIVE Final    Comment: (NOTE) SARS-CoV-2 target nucleic acids are NOT DETECTED.  The SARS-CoV-2 RNA is generally detectable in upper respiratory specimens during the acute phase of infection. The lowest concentration of SARS-CoV-2 viral copies this assay can detect is 138 copies/mL. A negative result does not preclude SARS-Cov-2 infection and should not be used as the sole basis for treatment or other patient management decisions. A negative result may occur with  improper specimen collection/handling, submission of specimen other than nasopharyngeal swab, presence of viral mutation(s) within the  areas targeted by this assay, and inadequate number of viral copies(<138 copies/mL). A negative result must be combined with clinical observations, patient history, and epidemiological information. The expected result is Negative.  Fact Sheet for Patients:  BloggerCourse.com  Fact Sheet for Healthcare Providers:  SeriousBroker.it  This test is no t yet approved or cleared by the [place] FDA and  has been authorized for detection and/or diagnosis of SARS-CoV-2 by FDA under an Emergency Use Authorization (EUA). This EUA will remain  in effect (meaning this test can be used) for the duration of the COVID-19 declaration under Section 564(b)(1) of the Act, 21 U.S.C.section 360bbb-3(b)(1), unless the authorization is terminated  or revoked  sooner.       Influenza A by PCR NEGATIVE NEGATIVE Final   Influenza B by PCR NEGATIVE NEGATIVE Final    Comment: (NOTE) The Xpert Xpress SARS-CoV-2/FLU/RSV plus assay is intended as an aid in the diagnosis of influenza from Nasopharyngeal swab specimens and should not be used as a sole basis for treatment. Nasal washings and aspirates are unacceptable for Xpert Xpress SARS-CoV-2/FLU/RSV testing.  Fact Sheet for Patients: BloggerCourse.com  Fact Sheet for Healthcare Providers: SeriousBroker.it  This test is not yet approved or cleared by the [place] FDA and has been authorized for detection and/or diagnosis of SARS-CoV-2 by FDA under an Emergency Use Authorization (EUA). This EUA will remain in effect (meaning this test can be used) for the duration of the COVID-19 declaration under Section 564(b)(1) of the Act, 21 U.S.C. section 360bbb-3(b)(1), unless the authorization is terminated or revoked.     Resp Syncytial Virus by PCR NEGATIVE NEGATIVE Final    Comment: (NOTE) Fact Sheet for Patients: BloggerCourse.com  Fact Sheet for Healthcare Providers: SeriousBroker.it  This test is not yet approved or cleared by the [place] FDA and has been authorized for detection and/or diagnosis of SARS-CoV-2 by FDA under an Emergency Use Authorization (EUA). This EUA will remain in effect (meaning this test can be used) for the duration of the COVID-19 declaration under Section 564(b)(1) of the Act, 21 U.S.C. section 360bbb-3(b)(1), unless the authorization is terminated or revoked.  Performed at [facility], [address], [place], [place] [address]   Culture, blood (single)     Status: None   Collection Time: 11/27/22  6:49 PM   Specimen: BLOOD  Result Value Ref Range Status   Specimen Description BLOOD BOTTLES DRAWN AEROBIC AND ANAEROBIC  Final   Special Requests    Final    BLOOD RIGHT ARM Blood Culture results may not be optimal due to an excessive volume of blood received in culture bottles   Culture   Final    NO GROWTH 5 DAYS Performed at [facility], [address], [place], [place] [address]    Report Status 12/02/2022 FINAL  Final  MRSA Next Gen by PCR, Nasal     Status: None   Collection Time: 11/27/22  9:28 PM   Specimen: Nasal Mucosa; Nasal Swab  Result Value Ref Range Status   MRSA by PCR Next Gen NOT DETECTED NOT DETECTED Final    Comment: (NOTE) The GeneXpert MRSA Assay (FDA approved for NASAL specimens only), is one component of a comprehensive MRSA colonization surveillance program. It is not intended to diagnose MRSA infection nor to guide or monitor treatment for MRSA infections. Test performance is not FDA approved in patients less than 29 years old. Performed at [facility], [address], [place], [place] [address]  Labs: BNP (last 3 results) Recent Labs    11/30/22 0530 12/01/22 0425 12/02/22 0423  BNP 2,976.0* 2,197.0* 1,087.0*   Basic Metabolic Panel: Recent Labs  Lab 11/28/22 0443 11/30/22 0530 12/01/22 0425 12/01/22 1531 12/02/22 0423 12/02/22 1524 12/02/22 1526 12/02/22 1528 12/03/22 0734  NA 132* 135 134* 134* 134* 138 138 138 136  K 4.2 3.6 3.1* 3.5 3.3* 4.0 4.0 4.0 3.8  CL 101 97* 93* 96* 96*  --   --   --  103  CO2 22 27 29 27 26   --   --   --  25  GLUCOSE 156* 104* 102* 148* 98  --   --   --  142*  BUN 30* 43* 45* 45* 44*  --   --   --  36*  CREATININE 1.74* 1.67* 1.77* 1.82* 1.71*  --   --   --  1.78*  CALCIUM 7.7* 8.1* 8.3* 8.1* 7.9*  --   --   --  8.3*  MG 1.8  --   --   --  1.7  --   --   --  1.7   Liver Function Tests: Recent Labs  Lab 11/28/22 0443  AST 31  ALT 25  ALKPHOS 54  BILITOT 0.6  PROT 5.9*  ALBUMIN 2.9*   No results for input(s): "LIPASE", "AMYLASE" in the last 168 hours. No results for input(s): "AMMONIA" in the last 168 hours. CBC: Recent Labs  Lab  11/27/22 1757 11/28/22 0443 11/30/22 0530 12/01/22 0425 12/02/22 0423 12/02/22 1524 12/02/22 1526 12/02/22 1528 12/03/22 0734  WBC 15.7* 6.1 12.7* 11.8* 12.6*  --   --   --  12.7*  NEUTROABS 10.4* 5.6  --   --   --   --   --   --   --   HGB 15.5 13.9 14.2 15.3 15.1 15.3 15.3 15.3 15.8  HCT 46.3 41.4 42.2 46.2 46.9 45.0 45.0 45.0 47.4  MCV 90.1 88.1 88.8 87.7 89.7  --   --   --  88.9  PLT 413* 275 324 361 326  --   --   --  364   Cardiac Enzymes: No results for input(s): "CKTOTAL", "CKMB", "CKMBINDEX", "TROPONINI" in the last 168 hours. BNP: Invalid input(s): "POCBNP" CBG: No results for input(s): "GLUCAP" in the last 168 hours. D-Dimer No results for input(s): "DDIMER" in the last 72 hours. Hgb A1c No results for input(s): "HGBA1C" in the last 72 hours. Lipid Profile Recent Labs    12/01/22 0425  CHOL 195  HDL 52  LDLCALC 121*  TRIG 111  CHOLHDL 3.8   Thyroid function studies No results for input(s): "TSH", "T4TOTAL", "T3FREE", "THYROIDAB" in the last 72 hours.  Invalid input(s): "FREET3" Anemia work up No results for input(s): "VITAMINB12", "FOLATE", "FERRITIN", "TIBC", "IRON", "RETICCTPCT" in the last 72 hours. Urinalysis    Component Value Date/Time   COLORURINE YELLOW (A) 06/18/2017 1139   APPEARANCEUR CLEAR (A) 06/18/2017 1139   LABSPEC 1.004 (L) 06/18/2017 1139   PHURINE 6.0 06/18/2017 1139   GLUCOSEU NEGATIVE 06/18/2017 1139   HGBUR NEGATIVE 06/18/2017 1139   BILIRUBINUR NEGATIVE 06/18/2017 1139   KETONESUR NEGATIVE 06/18/2017 1139   PROTEINUR NEGATIVE 06/18/2017 1139   NITRITE NEGATIVE 06/18/2017 1139   LEUKOCYTESUR NEGATIVE 06/18/2017 1139   Sepsis Labs Recent Labs  Lab 11/30/22 0530 12/01/22 0425 12/02/22 0423 12/03/22 0734  WBC 12.7* 11.8* 12.6* 12.7*   Microbiology Recent Results (from the past 240 hour(s))  Resp panel by  RT-PCR (RSV, Flu A&B, Covid) Anterior Nasal Swab     Status: None   Collection Time: 11/27/22  6:02 PM   Specimen:  Anterior Nasal Swab  Result Value Ref Range Status   SARS Coronavirus 2 by RT PCR NEGATIVE NEGATIVE Final    Comment: (NOTE) SARS-CoV-2 target nucleic acids are NOT DETECTED.  The SARS-CoV-2 RNA is generally detectable in upper respiratory specimens during the acute phase of infection. The lowest concentration of SARS-CoV-2 viral copies this assay can detect is 138 copies/mL. A negative result does not preclude SARS-Cov-2 infection and should not be used as the sole basis for treatment or other patient management decisions. A negative result may occur with  improper specimen collection/handling, submission of specimen other than nasopharyngeal swab, presence of viral mutation(s) within the areas targeted by this assay, and inadequate number of viral copies(<138 copies/mL). A negative result must be combined with clinical observations, patient history, and epidemiological information. The expected result is Negative.  Fact Sheet for Patients:  BloggerCourse.com  Fact Sheet for Healthcare Providers:  SeriousBroker.it  This test is no t yet approved or cleared by the [place] FDA and  has been authorized for detection and/or diagnosis of SARS-CoV-2 by FDA under an Emergency Use Authorization (EUA). This EUA will remain  in effect (meaning this test can be used) for the duration of the COVID-19 declaration under Section 564(b)(1) of the Act, 21 U.S.C.section 360bbb-3(b)(1), unless the authorization is terminated  or revoked sooner.       Influenza A by PCR NEGATIVE NEGATIVE Final   Influenza B by PCR NEGATIVE NEGATIVE Final    Comment: (NOTE) The Xpert Xpress SARS-CoV-2/FLU/RSV plus assay is intended as an aid in the diagnosis of influenza from Nasopharyngeal swab specimens and should not be used as a sole basis for treatment. Nasal washings and aspirates are unacceptable for Xpert Xpress SARS-CoV-2/FLU/RSV testing.  Fact  Sheet for Patients: BloggerCourse.com  Fact Sheet for Healthcare Providers: SeriousBroker.it  This test is not yet approved or cleared by the [place] FDA and has been authorized for detection and/or diagnosis of SARS-CoV-2 by FDA under an Emergency Use Authorization (EUA). This EUA will remain in effect (meaning this test can be used) for the duration of the COVID-19 declaration under Section 564(b)(1) of the Act, 21 U.S.C. section 360bbb-3(b)(1), unless the authorization is terminated or revoked.     Resp Syncytial Virus by PCR NEGATIVE NEGATIVE Final    Comment: (NOTE) Fact Sheet for Patients: BloggerCourse.com  Fact Sheet for Healthcare Providers: SeriousBroker.it  This test is not yet approved or cleared by the [place] FDA and has been authorized for detection and/or diagnosis of SARS-CoV-2 by FDA under an Emergency Use Authorization (EUA). This EUA will remain in effect (meaning this test can be used) for the duration of the COVID-19 declaration under Section 564(b)(1) of the Act, 21 U.S.C. section 360bbb-3(b)(1), unless the authorization is terminated or revoked.  Performed at [facility], [address] [place], [place] [address]   Culture, blood (single)     Status: None   Collection Time: 11/27/22  6:49 PM   Specimen: BLOOD  Result Value Ref Range Status   Specimen Description BLOOD BOTTLES DRAWN AEROBIC AND ANAEROBIC  Final   Special Requests   Final    BLOOD RIGHT ARM Blood Culture results may not be optimal due to an excessive volume of blood received in culture bottles   Culture   Final    NO GROWTH 5 DAYS Performed  at [facility], [address] [place], [place] [address]    Report Status 12/02/2022 FINAL  Final  MRSA Next Gen by PCR, Nasal     Status: None   Collection Time: 11/27/22  9:28 PM   Specimen: Nasal Mucosa; Nasal Swab  Result Value  Ref Range Status   MRSA by PCR Next Gen NOT DETECTED NOT DETECTED Final    Comment: (NOTE) The GeneXpert MRSA Assay (FDA approved for NASAL specimens only), is one component of a comprehensive MRSA colonization surveillance program. It is not intended to diagnose MRSA infection nor to guide or monitor treatment for MRSA infections. Test performance is not FDA approved in patients less than 59 years old. Performed at [facility], [address] [place], [place] [address]      Time coordinating discharge: Over 30 minutes  SIGNED:   Azucena Fallen, DO [facility] 12/03/2022, 6:17 PM Pager   If 7PM-7AM, please contact night-coverage [email or web address]

## 2022-12-03 NOTE — Discharge Instructions (Signed)
Groin Site Care (for leg cath site) Refer to this sheet in the next few weeks. These instructions provide you with information on caring for yourself after your procedure. Your caregiver may also give you more specific instructions. Your treatment has been planned according to current medical practices, but problems sometimes occur. Call your caregiver if you have any problems or questions after your procedure. HOME CARE INSTRUCTIONS You may shower 24 hours after the procedure. Remove the bandage (dressing) and gently wash the site with plain soap and water. Gently pat the site dry.  Do not apply powder or lotion to the site.  Do not sit in a bathtub, swimming pool, or whirlpool for 5 to 7 days.  No bending, squatting, or lifting anything over 10 pounds (4.5 kg) as directed by your caregiver.  Inspect the site at least twice daily.  Do not drive home if you are discharged the same day of the procedure. Have someone else drive you.  You may drive 24 hours after the procedure unless otherwise instructed by your caregiver.  What to expect: Any bruising will usually fade within 1 to 2 weeks.  Blood that collects in the tissue (hematoma) may be painful to the touch. It should usually decrease in size and tenderness within 1 to 2 weeks.  SEEK IMMEDIATE MEDICAL CARE IF: You have unusual pain at the groin site or down the affected leg.  You have redness, warmth, swelling, or pain at the groin site.  You have drainage (other than a small amount of blood on the dressing).  You have chills.  You have a fever or persistent symptoms for more than 72 hours.  You have a fever and your symptoms suddenly get worse.  Your leg becomes pale, cool, tingly, or numb.  You have heavy bleeding from the site. Hold pressure on the site.   Radial Site Care (for wrist cath site) Refer to this sheet in the next few weeks. These instructions provide you with information on caring for yourself after your procedure. Your  caregiver may also give you more specific instructions. Your treatment has been planned according to current medical practices, but problems sometimes occur. Call your caregiver if you have any problems or questions after your procedure. HOME CARE INSTRUCTIONS You may shower the day after the procedure. Remove the bandage (dressing) and gently wash the site with plain soap and water. Gently pat the site dry.  Do not apply powder or lotion to the site.  Do not submerge the affected site in water for 3 to 5 days.  Inspect the site at least twice daily.  Do not flex or bend the affected arm for 24 hours.  No lifting over 5 pounds (2.3 kg) for 5 days after your procedure.  Do not drive home if you are discharged the same day of the procedure. Have someone else drive you.  You may drive 24 hours after the procedure unless otherwise instructed by your caregiver.  What to expect: Any bruising will usually fade within 1 to 2 weeks.  Blood that collects in the tissue (hematoma) may be painful to the touch. It should usually decrease in size and tenderness within 1 to 2 weeks.  SEEK IMMEDIATE MEDICAL CARE IF: You have unusual pain at the radial site.  You have redness, warmth, swelling, or pain at the radial site.  You have drainage (other than a small amount of blood on the dressing).  You have chills.  You have a fever or persistent  symptoms for more than 72 hours.  You have a fever and your symptoms suddenly get worse.  Your arm becomes pale, cool, tingly, or numb.  You have heavy bleeding from the site. Hold pressure on the site.

## 2022-12-03 NOTE — Progress Notes (Signed)
Rounding Note    Patient Name: Tommy Knight Date of Encounter: 12/03/2022  [facility] Cardiologist: Marjo Bicker, MD   Subjective   No acute events overnight. Asking to go home.  Inpatient Medications    Scheduled Meds:  aspirin EC  81 mg Oral QHS   atorvastatin  40 mg Oral Daily   clopidogrel  75 mg Oral Daily   guaiFENesin  600 mg Oral BID   heparin  5,000 Units Subcutaneous Q8H   ipratropium-albuterol  3 mL Nebulization BID   mometasone-formoterol  2 puff Inhalation BID   nicotine  21 mg Transdermal Daily   pantoprazole  40 mg Oral BID   sodium chloride flush  3 mL Intravenous Q12H   Continuous Infusions:  sodium chloride     PRN Meds: sodium chloride, acetaminophen **OR** acetaminophen, albuterol, methocarbamol, ondansetron (ZOFRAN) IV, sodium chloride flush   Vital Signs    Vitals:   12/02/22 2246 12/03/22 0409 12/03/22 0803 12/03/22 0817  BP: 97/82 98/81 (!) 89/78   Pulse: 82 74 94 78  Resp: 20 19 18 18   Temp: 97.9 F (36.6 C) 97.7 F (36.5 C) 97.8 F (36.6 C)   TempSrc: Oral Oral Oral   SpO2: 93% 93% 93% 93%  Weight:  80.1 kg    Height:        Intake/Output Summary (Last 24 hours) at 12/03/2022 1053 Last data filed at 12/02/2022 2110 Gross per 24 hour  Intake --  Output 650 ml  Net -650 ml      12/03/2022    4:09 AM 12/02/2022    1:48 PM 12/02/2022    5:00 AM  Last 3 Weights  Weight (lbs) 176 lb 9.6 oz 174 lb 178 lb 12.7 oz  Weight (kg) 80.105 kg 78.926 kg 81.1 kg      Telemetry    SR - Personally Reviewed  Physical Exam   GEN: No acute distress.   Neck: No JVD Cardiac: RRR, no rubs, or gallops. 2/6 systolic murmur. Cath sites c/d/i Respiratory: diffusely diminished without wheezing or rales GI: Soft, nontender, non-distended  MS: No edema; No deformity. Neuro:  Nonfocal  Psych: Normal affect   New pertinent results (labs, ECG, imaging, cardiac studies)    Reviewed R/LHC today Planned for outpatient  CT  Patient Profile     77 y.o. male with  COPD, HTN, CVA 2019, HTN, HLD, habitual ETOH intake, tobacco abuse, GERD, carotid artery disease, suspected L subclavian stenosis by CT 2019, mildly dilated aortic root by echo 2019, hyperkalemia admitted with SOB and orthopnea, found to have elevated BNP, AKI (with unclear baseline), hyponatremia, and mildly elevated troponin. 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 Would discharge on lasix 20 mg daily PRN for now. At follow up, would consider adding SGLT2i which has kidney protection and would also help with volume, if BP and renal function are stable. -has had hypotension throughout admission. No room for ACEi/ARB/ARNI/MRA. Currently BP 89/78, asymptomatic. If BP allows, could trial low dose metoprolol at outpatient follow up. -suspect he will need outpatient vascular consult.  Patient is stable for discharge, communicated w/Dr. Natale Milch. Also gave instructions (printed letter) for CT scheduled  10/2. Dr. Lynnette Caffey follow up scheduled 10/4.  Med recs: Aspirin, clopidogrel, atorvastatin as currently ordered Lasix PRN 20 mg daily for weight gain/swelling/shortness of breath  Consider SGLT2i at follow up visit    Signed, Jodelle Red, MD  12/03/2022, 10:53 AM

## 2022-12-03 NOTE — Addendum Note (Signed)
Addended by: Janetta Hora on: 12/03/2022 09:48 AM   Modules accepted: Orders

## 2022-12-07 LAB — LIPOPROTEIN A (LPA): Lipoprotein (a): 15.1 nmol/L (ref <75.0)

## 2022-12-10 ENCOUNTER — Other Ambulatory Visit: Payer: Self-pay | Admitting: Physician Assistant

## 2022-12-10 MED ORDER — METOPROLOL TARTRATE 50 MG PO TABS: 50.0000 mg | ORAL_TABLET | ORAL | 0 refills | Status: DC

## 2022-12-10 NOTE — Addendum Note (Signed)
Addended by: Janetta Hora on: 12/10/2022 09:35 AM   Modules accepted: Orders

## 2022-12-15 ENCOUNTER — Ambulatory Visit (HOSPITAL_COMMUNITY)
Admission: RE | Admit: 2022-12-15 | Discharge: 2022-12-15 | Disposition: A | Discharge status: Home / Self Care | Payer: Medicare HMO | Source: Ambulatory Visit | Attending: Physician Assistant | Admitting: Physician Assistant

## 2022-12-15 ENCOUNTER — Ambulatory Visit (HOSPITAL_COMMUNITY)
Admit: 2022-12-15 | Discharge: 2022-12-15 | Disposition: A | Discharge status: Home / Self Care | Payer: Medicare HMO | Attending: Internal Medicine | Admitting: Internal Medicine

## 2022-12-15 DIAGNOSIS — I350 Nonrheumatic aortic (valve) stenosis: Secondary | ICD-10-CM

## 2022-12-15 LAB — BASIC METABOLIC PANEL
Anion gap: 10 (ref 5–15)
BUN: 20 mg/dL (ref 8–23)
CO2: 27 mmol/L (ref 22–32)
Calcium: 8.7 mg/dL — ABNORMAL LOW (ref 8.9–10.3)
Chloride: 98 mmol/L (ref 98–111)
Creatinine, Ser: 1.41 mg/dL — ABNORMAL HIGH (ref 0.61–1.24)
GFR, Estimated: 51 mL/min — ABNORMAL LOW (ref >60)
Glucose, Bld: 118 mg/dL — ABNORMAL HIGH (ref 70–99)
Potassium: 4.7 mmol/L (ref 3.5–5.1)
Sodium: 135 mmol/L (ref 135–145)

## 2022-12-15 MED ORDER — SODIUM CHLORIDE 0.9 % WEIGHT BASED INFUSION
3.0000 mL/kg/h | INTRAVENOUS | Status: DC
  Administered 2022-12-15: 3 mL/kg/h via INTRAVENOUS

## 2022-12-15 MED ORDER — SODIUM CHLORIDE 0.9 % WEIGHT BASED INFUSION: 1.0000 mL/kg/h | INTRAVENOUS | Status: DC

## 2022-12-15 MED ORDER — IOHEXOL 350 MG/ML SOLN
100.0000 mL | Freq: Once | INTRAVENOUS | Status: AC | PRN
  Administered 2022-12-15: 100 mL via INTRAVENOUS

## 2022-12-15 NOTE — Progress Notes (Signed)
Patient ID: [name] MRN: [id number] DOB/AGE: [birth year] 78 y.o.  Primary Care Physician:Strader, Alleen Borne, NP Primary Cardiologist: Luane School  FOCUSED CARDIOVASCULAR PROBLEM LIST:   1.  Low-flow low gradient severe aortic stenosis with ejection fraction of 25 to 30%; EKG demonstrates sinus rhythm without bundle-branch blocks 2.  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  Hypertension 4.  Hyperlipidemia 5.  Stroke 2019 6.  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 male with the indicated medical history here for recommendations regarding his aortic valvular disease.  The patient was recently admitted to the hospital for an acute on chronic systolic heart failure exacerbation.  He was found to have low-flow low gradient aortic stenosis.  He was medically stabilized.  He underwent cardiac catheterization which demonstrated mild to moderate nonobstructive coronary artery disease, a preserved cardiac output of 5.0 L/min and index of 2.5 L/min/m squared with a right atrial pressure of 2 mmHg and a wedge pressure mean of 5 mmHg.  He had a very calcified right subclavian that could not be navigated during his cardiac catheterization.  A femoral approach was pursued.  He has very significant bilateral peripheral arterial disease of the common iliacs are highly calcified.  The femoral arteries are also highly calcified.  Initially a right femoral approach was pursued but then abandon for left femoral approach which was not successful.  The right femoral approach was then pursued again which was successful.  The patient is here with his wife and daughter.  He tells me that he does get short of breath at home when he walks to his shop and back.  This has been going on for a long time but is may be getting worse over the last couple of months.   Since being discharged from the hospital he is feeling well.  He denies any worsening shortness of breath other than his chronic shortness of breath.  He definitely feels better than when compared to immediately before his hospitalization.  He denies any presyncope or syncope.  He has had no exertional chest discomfort.  He does sleep upright due to issues with COPD and laying supine.  He denies any peripheral edema, palpitations, or signs or symptoms of recurrent stroke.  He endorses bilateral lower extremity claudication symptoms on exertion.  He has both upper and lower dentures.  He unfortunately continues to smoke.  Past Medical History:  Diagnosis Date   Allergic rhinitis    Asthma    Carotid artery disease (HCC)    COPD (chronic obstructive pulmonary disease) (HCC)    Hyperlipidemia    Hypertension    Stenosis of left subclavian artery (HCC)    Stroke [facility])     Past Surgical History:  Procedure Laterality Date   APPENDECTOMY     RIGHT/LEFT HEART CATH AND CORONARY ANGIOGRAPHY N/A 12/02/2022   Procedure: RIGHT/LEFT HEART CATH AND CORONARY ANGIOGRAPHY;  Surgeon: Orbie Pyo, MD;  Location: [facility];  Service: Cardiovascular;  Laterality: N/A;    Family History  Problem Relation Age of Onset   Heart disease Other        multiple family members he's not sure    Social History   Socioeconomic History   Marital status: Married    Spouse name: Not on file   Number of children: Not on file   Years of education: Not  amaurosis ENT: no sore throat or hearing loss Resp: no cough, wheezing, or hemoptysis CV: no edema or palpitations GI: no abdominal pain, nausea, vomiting, diarrhea, or constipation GU: no dysuria, frequency, or hematuria Skin: no rash Neuro: no headache, numbness, tingling, or weakness of extremities Musculoskeletal: no joint pain or swelling Heme: no bleeding, DVT, or easy bruising Endo: no polydipsia or polyuria  BP 90/70   Pulse 68   Ht 5\' 11"  (1.803 m)   Wt 186 lb (84.4 kg)   BMI 25.94 kg/m   PHYSICAL EXAM: GEN:  AO x 3 in no acute distress HEENT: normal Dentition: Upper and lower dentures Neck: JVP normal. +2 carotid upstrokes without bruits. No thyromegaly. Lungs: equal expansion, clear bilaterally CV: Apex is discrete and nondisplaced, RRR with 2 out of 6 murmur Abd: soft, non-tender, non-distended; no bruit; positive bowel sounds Ext: no edema, ecchymoses, or cyanosis Vascular: 2+ femoral pulses, poor radial pulses       Skin: warm and dry without rash Neuro: CN II-XII grossly intact; motor and sensory grossly intact    DATA AND STUDIES:  EKG:     September 2024 EKG that I personally reviewed  demonstrates sinus rhythm  Cardiac Studies & Procedures   CARDIAC CATHETERIZATION  CARDIAC CATHETERIZATION 12/02/2022  Narrative   Dist LM lesion is 30% stenosed.  1.  Mild to moderate nonobstructive coronary artery disease. 2.  Severe bilateral common iliac stenoses, common femoral stenoses, and subclavian stenoses. 3.  Cardiac output of 5.0 L/min and cardiac index of 2.5 L/min/m with the following hemodynamics: Right atrial pressure mean of 2 mmHg Right ventricular pressure 27/1 with end-diastolic pressure of 1 mmHg Wedge pressure mean of 5 mmHg Pulmonary artery pressure 30/0 with a mean of 14 mmHg PVR of 1.8 Woods units PA pulsatility index of greater than 10  Recommendation: Medical therapy; will need evaluation for aortic valve intervention however severe obstructive peripheral vascular disease will limit this and may need to be addressed prior to any aortic valve intervention.  Findings Coronary Findings Diagnostic  Dominance: Right  Left Main Dist LM lesion is 30% stenosed.  Left Anterior Descending The vessel exhibits minimal luminal irregularities.  Left Circumflex The vessel exhibits minimal luminal irregularities.  Right Coronary Artery The vessel exhibits minimal luminal irregularities.  Intervention  No interventions have been documented.     ECHOCARDIOGRAM  ECHOCARDIOGRAM COMPLETE 11/29/2022  Narrative ECHOCARDIOGRAM REPORT    Patient Name:   Tommy Knight Date of Exam: 11/29/2022 Medical Rec #:  [id number]     Height:       71.0 in Accession #:    [id number]    Weight:       193.3 lb Date of Birth:  02/13/1946     BSA:          2.078 m Patient Age:    77 years      BP:           148/84 mmHg Patient Gender: M             HR:           83 bpm. [facility]  Procedure: 2D Echo, Cardiac Doppler and Color Doppler  Indications:    SBE I33.9  History:        Patient has prior history of Echocardiogram examinations, most recent  08/23/2017. CHF, Stroke and COPD; Risk Factors:Hypertension, Dyslipidemia and Current Smoker.  Sonographer:    Celesta Gentile RCS Referring Phys: [phone] SEYED A  Patient ID: [name] MRN: [id number] DOB/AGE: [birth year] 78 y.o.  Primary Care Physician:Strader, Alleen Borne, NP Primary Cardiologist: Luane School  FOCUSED CARDIOVASCULAR PROBLEM LIST:   1.  Low-flow low gradient severe aortic stenosis with ejection fraction of 25 to 30%; EKG demonstrates sinus rhythm without bundle-branch blocks 2.  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  Hypertension 4.  Hyperlipidemia 5.  Stroke 2019 6.  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 male with the indicated medical history here for recommendations regarding his aortic valvular disease.  The patient was recently admitted to the hospital for an acute on chronic systolic heart failure exacerbation.  He was found to have low-flow low gradient aortic stenosis.  He was medically stabilized.  He underwent cardiac catheterization which demonstrated mild to moderate nonobstructive coronary artery disease, a preserved cardiac output of 5.0 L/min and index of 2.5 L/min/m squared with a right atrial pressure of 2 mmHg and a wedge pressure mean of 5 mmHg.  He had a very calcified right subclavian that could not be navigated during his cardiac catheterization.  A femoral approach was pursued.  He has very significant bilateral peripheral arterial disease of the common iliacs are highly calcified.  The femoral arteries are also highly calcified.  Initially a right femoral approach was pursued but then abandon for left femoral approach which was not successful.  The right femoral approach was then pursued again which was successful.  The patient is here with his wife and daughter.  He tells me that he does get short of breath at home when he walks to his shop and back.  This has been going on for a long time but is may be getting worse over the last couple of months.   Since being discharged from the hospital he is feeling well.  He denies any worsening shortness of breath other than his chronic shortness of breath.  He definitely feels better than when compared to immediately before his hospitalization.  He denies any presyncope or syncope.  He has had no exertional chest discomfort.  He does sleep upright due to issues with COPD and laying supine.  He denies any peripheral edema, palpitations, or signs or symptoms of recurrent stroke.  He endorses bilateral lower extremity claudication symptoms on exertion.  He has both upper and lower dentures.  He unfortunately continues to smoke.  Past Medical History:  Diagnosis Date   Allergic rhinitis    Asthma    Carotid artery disease (HCC)    COPD (chronic obstructive pulmonary disease) (HCC)    Hyperlipidemia    Hypertension    Stenosis of left subclavian artery (HCC)    Stroke [facility])     Past Surgical History:  Procedure Laterality Date   APPENDECTOMY     RIGHT/LEFT HEART CATH AND CORONARY ANGIOGRAPHY N/A 12/02/2022   Procedure: RIGHT/LEFT HEART CATH AND CORONARY ANGIOGRAPHY;  Surgeon: Orbie Pyo, MD;  Location: [facility];  Service: Cardiovascular;  Laterality: N/A;    Family History  Problem Relation Age of Onset   Heart disease Other        multiple family members he's not sure    Social History   Socioeconomic History   Marital status: Married    Spouse name: Not on file   Number of children: Not on file   Years of education: Not  Patient ID: [name] MRN: [id number] DOB/AGE: [birth year] 77 y.o.  Primary Care Physician:Strader, Alleen Borne, NP Primary Cardiologist: Luane School  FOCUSED CARDIOVASCULAR PROBLEM LIST:   1.  Low-flow low gradient severe aortic stenosis with ejection fraction of 25 to 30%; EKG demonstrates sinus rhythm without bundle-branch blocks 2.  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  Hypertension 4.  Hyperlipidemia 5.  Stroke 2019 6.  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 male with the indicated medical history here for recommendations regarding his aortic valvular disease.  The patient was recently admitted to the hospital for an acute on chronic systolic heart failure exacerbation.  He was found to have low-flow low gradient aortic stenosis.  He was medically stabilized.  He underwent cardiac catheterization which demonstrated mild to moderate nonobstructive coronary artery disease, a preserved cardiac output of 5.0 L/min and index of 2.5 L/min/m squared with a right atrial pressure of 2 mmHg and a wedge pressure mean of 5 mmHg.  He had a very calcified right subclavian that could not be navigated during his cardiac catheterization.  A femoral approach was pursued.  He has very significant bilateral peripheral arterial disease of the common iliacs are highly calcified.  The femoral arteries are also highly calcified.  Initially a right femoral approach was pursued but then abandon for left femoral approach which was not successful.  The right femoral approach was then pursued again which was successful.  The patient is here with his wife and daughter.  He tells me that he does get short of breath at home when he walks to his shop and back.  This has been going on for a long time but is may be getting worse over the last couple of months.   Since being discharged from the hospital he is feeling well.  He denies any worsening shortness of breath other than his chronic shortness of breath.  He definitely feels better than when compared to immediately before his hospitalization.  He denies any presyncope or syncope.  He has had no exertional chest discomfort.  He does sleep upright due to issues with COPD and laying supine.  He denies any peripheral edema, palpitations, or signs or symptoms of recurrent stroke.  He endorses bilateral lower extremity claudication symptoms on exertion.  He has both upper and lower dentures.  He unfortunately continues to smoke.  Past Medical History:  Diagnosis Date   Allergic rhinitis    Asthma    Carotid artery disease (HCC)    COPD (chronic obstructive pulmonary disease) (HCC)    Hyperlipidemia    Hypertension    Stenosis of left subclavian artery (HCC)    Stroke [facility])     Past Surgical History:  Procedure Laterality Date   APPENDECTOMY     RIGHT/LEFT HEART CATH AND CORONARY ANGIOGRAPHY N/A 12/02/2022   Procedure: RIGHT/LEFT HEART CATH AND CORONARY ANGIOGRAPHY;  Surgeon: Orbie Pyo, MD;  Location: [facility];  Service: Cardiovascular;  Laterality: N/A;    Family History  Problem Relation Age of Onset   Heart disease Other        multiple family members he's not sure    Social History   Socioeconomic History   Marital status: Married    Spouse name: Not on file   Number of children: Not on file   Years of education: Not  Patient ID: [name] MRN: [id number] DOB/AGE: [birth year] 77 y.o.  Primary Care Physician:Strader, Alleen Borne, NP Primary Cardiologist: Luane School  FOCUSED CARDIOVASCULAR PROBLEM LIST:   1.  Low-flow low gradient severe aortic stenosis with ejection fraction of 25 to 30%; EKG demonstrates sinus rhythm without bundle-branch blocks 2.  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  Hypertension 4.  Hyperlipidemia 5.  Stroke 2019 6.  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 male with the indicated medical history here for recommendations regarding his aortic valvular disease.  The patient was recently admitted to the hospital for an acute on chronic systolic heart failure exacerbation.  He was found to have low-flow low gradient aortic stenosis.  He was medically stabilized.  He underwent cardiac catheterization which demonstrated mild to moderate nonobstructive coronary artery disease, a preserved cardiac output of 5.0 L/min and index of 2.5 L/min/m squared with a right atrial pressure of 2 mmHg and a wedge pressure mean of 5 mmHg.  He had a very calcified right subclavian that could not be navigated during his cardiac catheterization.  A femoral approach was pursued.  He has very significant bilateral peripheral arterial disease of the common iliacs are highly calcified.  The femoral arteries are also highly calcified.  Initially a right femoral approach was pursued but then abandon for left femoral approach which was not successful.  The right femoral approach was then pursued again which was successful.  The patient is here with his wife and daughter.  He tells me that he does get short of breath at home when he walks to his shop and back.  This has been going on for a long time but is may be getting worse over the last couple of months.   Since being discharged from the hospital he is feeling well.  He denies any worsening shortness of breath other than his chronic shortness of breath.  He definitely feels better than when compared to immediately before his hospitalization.  He denies any presyncope or syncope.  He has had no exertional chest discomfort.  He does sleep upright due to issues with COPD and laying supine.  He denies any peripheral edema, palpitations, or signs or symptoms of recurrent stroke.  He endorses bilateral lower extremity claudication symptoms on exertion.  He has both upper and lower dentures.  He unfortunately continues to smoke.  Past Medical History:  Diagnosis Date   Allergic rhinitis    Asthma    Carotid artery disease (HCC)    COPD (chronic obstructive pulmonary disease) (HCC)    Hyperlipidemia    Hypertension    Stenosis of left subclavian artery (HCC)    Stroke [facility])     Past Surgical History:  Procedure Laterality Date   APPENDECTOMY     RIGHT/LEFT HEART CATH AND CORONARY ANGIOGRAPHY N/A 12/02/2022   Procedure: RIGHT/LEFT HEART CATH AND CORONARY ANGIOGRAPHY;  Surgeon: Orbie Pyo, MD;  Location: [facility];  Service: Cardiovascular;  Laterality: N/A;    Family History  Problem Relation Age of Onset   Heart disease Other        multiple family members he's not sure    Social History   Socioeconomic History   Marital status: Married    Spouse name: Not on file   Number of children: Not on file   Years of education: Not  amaurosis ENT: no sore throat or hearing loss Resp: no cough, wheezing, or hemoptysis CV: no edema or palpitations GI: no abdominal pain, nausea, vomiting, diarrhea, or constipation GU: no dysuria, frequency, or hematuria Skin: no rash Neuro: no headache, numbness, tingling, or weakness of extremities Musculoskeletal: no joint pain or swelling Heme: no bleeding, DVT, or easy bruising Endo: no polydipsia or polyuria  BP 90/70   Pulse 68   Ht 5\' 11"  (1.803 m)   Wt 186 lb (84.4 kg)   BMI 25.94 kg/m   PHYSICAL EXAM: GEN:  AO x 3 in no acute distress HEENT: normal Dentition: Upper and lower dentures Neck: JVP normal. +2 carotid upstrokes without bruits. No thyromegaly. Lungs: equal expansion, clear bilaterally CV: Apex is discrete and nondisplaced, RRR with 2 out of 6 murmur Abd: soft, non-tender, non-distended; no bruit; positive bowel sounds Ext: no edema, ecchymoses, or cyanosis Vascular: 2+ femoral pulses, poor radial pulses       Skin: warm and dry without rash Neuro: CN II-XII grossly intact; motor and sensory grossly intact    DATA AND STUDIES:  EKG:     September 2024 EKG that I personally reviewed  demonstrates sinus rhythm  Cardiac Studies & Procedures   CARDIAC CATHETERIZATION  CARDIAC CATHETERIZATION 12/02/2022  Narrative   Dist LM lesion is 30% stenosed.  1.  Mild to moderate nonobstructive coronary artery disease. 2.  Severe bilateral common iliac stenoses, common femoral stenoses, and subclavian stenoses. 3.  Cardiac output of 5.0 L/min and cardiac index of 2.5 L/min/m with the following hemodynamics: Right atrial pressure mean of 2 mmHg Right ventricular pressure 27/1 with end-diastolic pressure of 1 mmHg Wedge pressure mean of 5 mmHg Pulmonary artery pressure 30/0 with a mean of 14 mmHg PVR of 1.8 Woods units PA pulsatility index of greater than 10  Recommendation: Medical therapy; will need evaluation for aortic valve intervention however severe obstructive peripheral vascular disease will limit this and may need to be addressed prior to any aortic valve intervention.  Findings Coronary Findings Diagnostic  Dominance: Right  Left Main Dist LM lesion is 30% stenosed.  Left Anterior Descending The vessel exhibits minimal luminal irregularities.  Left Circumflex The vessel exhibits minimal luminal irregularities.  Right Coronary Artery The vessel exhibits minimal luminal irregularities.  Intervention  No interventions have been documented.     ECHOCARDIOGRAM  ECHOCARDIOGRAM COMPLETE 11/29/2022  Narrative ECHOCARDIOGRAM REPORT    Patient Name:   Tommy Knight Date of Exam: 11/29/2022 Medical Rec #:  [id number]     Height:       71.0 in Accession #:    [id number]    Weight:       193.3 lb Date of Birth:  02/13/1946     BSA:          2.078 m Patient Age:    77 years      BP:           148/84 mmHg Patient Gender: M             HR:           83 bpm. [facility]  Procedure: 2D Echo, Cardiac Doppler and Color Doppler  Indications:    SBE I33.9  History:        Patient has prior history of Echocardiogram examinations, most recent  08/23/2017. CHF, Stroke and COPD; Risk Factors:Hypertension, Dyslipidemia and Current Smoker.  Sonographer:    Celesta Gentile RCS Referring Phys: [phone] SEYED A  amaurosis ENT: no sore throat or hearing loss Resp: no cough, wheezing, or hemoptysis CV: no edema or palpitations GI: no abdominal pain, nausea, vomiting, diarrhea, or constipation GU: no dysuria, frequency, or hematuria Skin: no rash Neuro: no headache, numbness, tingling, or weakness of extremities Musculoskeletal: no joint pain or swelling Heme: no bleeding, DVT, or easy bruising Endo: no polydipsia or polyuria  BP 90/70   Pulse 68   Ht 5\' 11"  (1.803 m)   Wt 186 lb (84.4 kg)   BMI 25.94 kg/m   PHYSICAL EXAM: GEN:  AO x 3 in no acute distress HEENT: normal Dentition: Upper and lower dentures Neck: JVP normal. +2 carotid upstrokes without bruits. No thyromegaly. Lungs: equal expansion, clear bilaterally CV: Apex is discrete and nondisplaced, RRR with 2 out of 6 murmur Abd: soft, non-tender, non-distended; no bruit; positive bowel sounds Ext: no edema, ecchymoses, or cyanosis Vascular: 2+ femoral pulses, poor radial pulses       Skin: warm and dry without rash Neuro: CN II-XII grossly intact; motor and sensory grossly intact    DATA AND STUDIES:  EKG:     September 2024 EKG that I personally reviewed  demonstrates sinus rhythm  Cardiac Studies & Procedures   CARDIAC CATHETERIZATION  CARDIAC CATHETERIZATION 12/02/2022  Narrative   Dist LM lesion is 30% stenosed.  1.  Mild to moderate nonobstructive coronary artery disease. 2.  Severe bilateral common iliac stenoses, common femoral stenoses, and subclavian stenoses. 3.  Cardiac output of 5.0 L/min and cardiac index of 2.5 L/min/m with the following hemodynamics: Right atrial pressure mean of 2 mmHg Right ventricular pressure 27/1 with end-diastolic pressure of 1 mmHg Wedge pressure mean of 5 mmHg Pulmonary artery pressure 30/0 with a mean of 14 mmHg PVR of 1.8 Woods units PA pulsatility index of greater than 10  Recommendation: Medical therapy; will need evaluation for aortic valve intervention however severe obstructive peripheral vascular disease will limit this and may need to be addressed prior to any aortic valve intervention.  Findings Coronary Findings Diagnostic  Dominance: Right  Left Main Dist LM lesion is 30% stenosed.  Left Anterior Descending The vessel exhibits minimal luminal irregularities.  Left Circumflex The vessel exhibits minimal luminal irregularities.  Right Coronary Artery The vessel exhibits minimal luminal irregularities.  Intervention  No interventions have been documented.     ECHOCARDIOGRAM  ECHOCARDIOGRAM COMPLETE 11/29/2022  Narrative ECHOCARDIOGRAM REPORT    Patient Name:   Tommy Knight Date of Exam: 11/29/2022 Medical Rec #:  [id number]     Height:       71.0 in Accession #:    [id number]    Weight:       193.3 lb Date of Birth:  02/13/1946     BSA:          2.078 m Patient Age:    77 years      BP:           148/84 mmHg Patient Gender: M             HR:           83 bpm. [facility]  Procedure: 2D Echo, Cardiac Doppler and Color Doppler  Indications:    SBE I33.9  History:        Patient has prior history of Echocardiogram examinations, most recent  08/23/2017. CHF, Stroke and COPD; Risk Factors:Hypertension, Dyslipidemia and Current Smoker.  Sonographer:    Celesta Gentile RCS Referring Phys: [phone] SEYED A

## 2022-12-17 ENCOUNTER — Ambulatory Visit: Payer: Medicare HMO | Attending: Internal Medicine | Admitting: Internal Medicine

## 2022-12-17 ENCOUNTER — Encounter: Payer: Self-pay | Admitting: Internal Medicine

## 2022-12-17 VITALS — BP 90/70 | HR 68 | Ht 71.0 in | Wt 186.0 lb

## 2022-12-17 DIAGNOSIS — I350 Nonrheumatic aortic (valve) stenosis: Secondary | ICD-10-CM

## 2022-12-17 DIAGNOSIS — I428 Other cardiomyopathies: Secondary | ICD-10-CM

## 2022-12-17 DIAGNOSIS — I6523 Occlusion and stenosis of bilateral carotid arteries: Secondary | ICD-10-CM

## 2022-12-17 DIAGNOSIS — I739 Peripheral vascular disease, unspecified: Secondary | ICD-10-CM | POA: Diagnosis not present

## 2022-12-17 DIAGNOSIS — I639 Cerebral infarction, unspecified: Secondary | ICD-10-CM

## 2022-12-17 NOTE — Patient Instructions (Signed)
Medication Instructions:   Your physician recommends that you continue on your current medications as directed. Please refer to the Current Medication list given to you today.  *If you need a refill on your cardiac medications before your next appointment, please call your pharmacy*     Follow-Up:  OUR STRUCTURAL TEAM/NURSE NAVIGATOR (LAUREN BROWN RN)  WILL BE REACHING OUT TO YOU SOON TO SCHEDULE YOUR PROCEDURE DATE AND PROVIDE YOUR TAVR INSTRUCTIONS

## 2022-12-17 NOTE — Progress Notes (Signed)
Pre Surgical Assessment: 5 M Walk Test  60M=16.49ft  5 Meter Walk Test- trial 1: 7.06 seconds 5 Meter Walk Test- trial 2: 7.33 seconds 5 Meter Walk Test- trial 3: 5.71 seconds 5 Meter Walk Test Average: 6.7 seconds

## 2022-12-21 ENCOUNTER — Telehealth: Payer: Self-pay | Admitting: Internal Medicine

## 2022-12-21 NOTE — Telephone Encounter (Signed)
Patient's wife states she is returning a call. 

## 2022-12-21 NOTE — Telephone Encounter (Signed)
  [facility]   MULTIDISCIPLINARY HEART VALVE TEAM   The patient's wife returned call to the structural heart team. The patient's case was reviewed this morning and due to access issues the team felt that the patient is not a candidate for TAVR. The patient's only potential access was left carotid but there is an area of calcium at the ostium that places the patient at high risk for an embolic event if a transcatheter approach is pursued. Dr Lynnette Caffey contacted Dr Jenene Slicker and advised that the patient is not a candidate for TAVR due to unfavorable access.  The patient will need to be contacted by Dr [facility] office to arrange continued cardiology follow up. The patient's wife verbalized understanding and is aware that she will be contacted to arrange follow up.

## 2022-12-21 NOTE — Telephone Encounter (Signed)
Patient was returning call. Please advise ?

## 2022-12-21 NOTE — Telephone Encounter (Signed)
Left voicemail to return call to office.

## 2022-12-29 ENCOUNTER — Encounter: Payer: Medicare HMO | Admitting: Surgery

## 2023-01-17 ENCOUNTER — Telehealth: Payer: Self-pay | Admitting: Internal Medicine

## 2023-01-17 DIAGNOSIS — J449 Chronic obstructive pulmonary disease, unspecified: Secondary | ICD-10-CM

## 2023-01-17 NOTE — Telephone Encounter (Signed)
Patient notified and verbalized understanding. 

## 2023-01-17 NOTE — Telephone Encounter (Signed)
Pt stated that he saw Dr. Lynnette Caffey in office and was told by the provider that he would send medication in to help pt. Pt unsure of medication name. No mention of medication changes were noted in providers ov note.   Please advise.

## 2023-01-17 NOTE — Telephone Encounter (Signed)
Pt states that he was in the hospital a few weeks ago and the doctor told him he was going to give him some medicine to help but has not received it. Please advise

## 2023-01-30 NOTE — Progress Notes (Unsigned)
Tommy Knight, male    DOB: 1945/10/11    MRN: [id number]   Brief patient profile:  58  yowm  active smoker with onset allergies 60s mostly allergies in spring = rhinitis and then sev years later started needing nebulizer  referred to pulmonary clinic in   01/31/2023 by Tommy Knight who is treating him for Aortic stenosis since around 11/2022 and admitted with decompensation      History of Present Illness  01/31/2023  Pulmonary/ 1st office eval/ Tommy Knight / [facility] Knight Office off acei x early September 2024   Chief Complaint  Patient presents with   Establish Care   Shortness of Breath  Dyspnea:  shop and back 100 ft slt hills stops half way both ways  Cough: brown mucus worse all day long  Sleep: < 30 degrees / 1 pillow  SABA use: neb bid/ hfa all the day  02: none   No obvious day to day or daytime pattern/variability or assoc excess/ purulent sputum or mucus plugs or hemoptysis or cp or chest tightness, subjective wheeze or overt  hb symptoms.    Also denies any obvious fluctuation of symptoms with weather or environmental changes or other aggravating or alleviating factors except as outlined above   No unusual exposure hx or h/o childhood pna/ asthma or knowledge of premature birth.  Current Allergies, Complete Past Medical History, Past Surgical History, Family History, and Social History were reviewed in [employer] record.  ROS  The following are not active complaints unless bolded Hoarseness, sore throat, dysphagia, dental problems, itching, sneezing,  nasal congestion or discharge of excess mucus or purulent secretions, ear ache,   fever, chills, sweats, unintended wt loss or wt gain, classically pleuritic or exertional cp,  orthopnea pnd or arm/hand swelling  or leg swelling, presyncope, palpitations, abdominal pain, anorexia, nausea, vomiting, diarrhea  or change in bowel habits or change in bladder habits, change in stools or change in urine, dysuria,  hematuria,  rash, arthralgias, visual complaints, headache, numbness, weakness or ataxia or problems with walking (uses cane)  or coordination,  change in mood or  memory.              Outpatient Medications Prior to Visit  Medication Sig Dispense Refill   albuterol (PROVENTIL HFA;VENTOLIN HFA) 108 (90 Base) MCG/ACT inhaler Inhale 2 puffs into the lungs every 4 (four) hours as needed for wheezing or shortness of breath.      aspirin EC 81 MG tablet Take 1 tablet (81 mg total) by mouth at bedtime. Swallow whole. 30 tablet 12   atorvastatin (LIPITOR) 40 MG tablet Take 40 mg by mouth every evening.     clopidogrel (PLAVIX) 75 MG tablet Take 1 tablet (75 mg total) by mouth daily. 30 tablet 2   Fluticasone-Salmeterol (ADVAIR) 250-50 MCG/DOSE AEPB Inhale 1 puff into the lungs 2 (two) times daily.     furosemide (LASIX) 20 MG tablet Take 1 tablet (20 mg total) by mouth daily as needed (As directed per cardiology for volume overload). 30 tablet 0   ipratropium-albuterol (DUONEB) 0.5-2.5 (3) MG/3ML SOLN Take 3 mLs by nebulization every 6 (six) hours as needed (for shortness of breath).     levocetirizine (XYZAL) 5 MG tablet Take 5 mg by mouth every evening.     montelukast (SINGULAIR) 10 MG tablet Take 10 mg by mouth every evening.     Multiple Vitamins-Minerals (CENTRUM SILVER 50+MEN) TABS Take 1 tablet by mouth daily.     Multiple  Vitamins-Minerals (PRESERVISION AREDS 2) CAPS Take 1 capsule by mouth 2 (two) times daily.     omeprazole (PRILOSEC) 20 MG capsule Take 20 mg by mouth daily.     nicotine (NICODERM CQ - DOSED IN MG/24 HOURS) 21 mg/24hr patch Place 1 patch (21 mg total) onto the skin daily. (Patient not taking: Reported on 12/17/2022) 28 patch 0   No facility-administered medications prior to visit.    Past Medical History:  Diagnosis Date   Allergic rhinitis    Asthma    Carotid artery disease (HCC)    COPD (chronic obstructive pulmonary disease) (HCC)    Hyperlipidemia     Hypertension    Stenosis of left subclavian artery (HCC)    Stroke (HCC)       Objective:     BP 109/73   Pulse 80   Ht 5\' 11"  (1.803 m)   Wt 189 lb (85.7 kg)   SpO2 94%   BMI 26.36 kg/m   SpO2: 94 % RA  Amb wm slt hoarse nad walks with cane    HEENT :  Oropharynx  clear/ edentulous  Nasal turbinates nl    NECK :  without JVD/Nodes/TM/ nl carotid upstrokes bilaterally   LUNGS: no acc muscle use,  Mod barrel  contour chest wall with bilateral  Distant bs s audible wheeze and  without cough on insp or exp maneuvers and mod  Hyperresonant  to  percussion bilaterally     CV:  RRR  no s3 or murmur or increase in P2, and no edema   ABD:  soft and nontender with pos mid insp Hoover's  in the supine position. No bruits or organomegaly appreciated, bowel sounds nl  MS:   Ext warm without deformities or   obvious joint restrictions , calf tenderness, cyanosis or clubbing  SKIN: warm and dry without lesions    NEURO:  alert, approp, nl sensorium with  no motor or cerebellar deficits apparent.              I personally reviewed images and agree with radiology impression as follows:   Chest CTcuts on coronary study    12/15/22  Central airways are patent. No consolidation, pleural effusion or pneumothorax. Mild ground-glass opacities of the left-greater-than-right lower lobes, likely due to scarring or atelectasis. Mild paraseptal emphysema. Calcified nodule of the right upper lobe, likely sequela of prior granulomatous infection. Trace left pleural effusion.   Assessment   DOE (dyspnea on exertion) Active smoker -  Echo 11/29/22  1. Left ventricular ejection fraction, by estimation, is 25 to 30%. The  left ventricle has severely decreased function. The left ventricle  demonstrates global hypokinesis. Left ventricular diastolic parameters are  consistent with Grade III diastolic  dysfunction (restrictive). Elevated left atrial pressure.   2. Right ventricular systolic  function is mildly reduced. The right  ventricular size is normal. Tricuspid regurgitation signal is inadequate  for assessing PA pressure.   3. Left atrial size was severely dilated.   4. Right atrial size was mildly dilated.   5. The mitral valve is abnormal. Trivial mitral valve regurgitation. No  evidence of mitral stenosis. Moderate mitral annular calcification.   6. The aortic valve was not well visualized but there appears to be  severe restriction of the aortic valve leaflet opening. There is severe  calcifcation of the aortic valve. Aortic valve regurgitation is not  visualized. Aortic valve mean gradient  measures 28.0 mmHg. Aortic valve Vmax measures 3.39 m/s. DVI is  0.21.  Overall, this is likely severe aortic valve stenosis, low flow-low  gradient in the setting of severely reduced LVEF.   7. The inferior vena cava is dilated in size with >50% respiratory  variability, suggesting right atrial pressure of 8 mmHg.  - CT chest 12/15/22 Mild paraseptal emphysema. - PFTs requested from [facility] 01/31/2023 >>>  - 01/31/2023  After extensive coaching inhaler device,  effectiveness =    60% > breztri trial  - 01/31/2023   Walked on RA  x  2  lap(s) =  approx 300  ft  @ mod /cane pace, stopped due to knee pain  with lowest 02 sats 94%    Clinically at least mod copd and likely  Group D (now reclassified as E) in terms of symptom/risk and laba/lama/ICS  therefore appropriate rx at this point >>>  breztri best choice since cough is such an issue suggesting AB or gerd component > rx empirically per AVS   Also possible there is component of cardiac asthma for which LAMA good choice.  F/u  6 weeks with all meds in hand using a trust but verify approach to confirm accurate Medication  Reconciliation The principal here is that until we are certain that the  patients are doing what we've asked, it makes no sense to ask them to do more.   Each maintenance medication was reviewed in  detail including emphasizing most importantly the difference between maintenance and prns and under what circumstances the prns are to be triggered using an action plan format where appropriate.  Total time for H and P, chart review, counseling, reviewing hfa device(s) , directly observing portions of ambulatory 02 saturation study/ and generating customized AVS unique to this office visit / same day charting > 60 min complex new pt eval.                    Cigarette smoker 4-5 min discussion re active cigarette smoking in addition to office E&M  Ask about tobacco use:   ongoing Advise quitting   I took an extended  opportunity with this patient to outline the consequences of continued cigarette use  in airway disorders based on all the data we have from the multiple national lung health studies (perfomed over decades at millions of dollars in cost)  indicating that smoking cessation, not choice of inhalers or pulmonary physicians, is the most important aspect of his  care.   Assess willingness:  Not committed at this point Assist in quit attempt:  encouraged to try nicotine patch as per PCP  Arrange follow up:   Follow up per Primary Care planned           Sandrea Hughs, MD 01/31/2023

## 2023-01-31 ENCOUNTER — Ambulatory Visit: Payer: Medicare HMO | Admitting: Internal Medicine

## 2023-01-31 ENCOUNTER — Encounter: Payer: Self-pay | Admitting: Internal Medicine

## 2023-01-31 VITALS — BP 109/73 | HR 80 | Ht 71.0 in | Wt 189.0 lb

## 2023-01-31 DIAGNOSIS — R0609 Other forms of dyspnea: Secondary | ICD-10-CM

## 2023-01-31 DIAGNOSIS — N400 Enlarged prostate without lower urinary tract symptoms: Secondary | ICD-10-CM | POA: Insufficient documentation

## 2023-01-31 DIAGNOSIS — F17210 Nicotine dependence, cigarettes, uncomplicated: Secondary | ICD-10-CM | POA: Diagnosis not present

## 2023-01-31 DIAGNOSIS — J309 Allergic rhinitis, unspecified: Secondary | ICD-10-CM | POA: Insufficient documentation

## 2023-01-31 MED ORDER — FAMOTIDINE 20 MG PO TABS: ORAL_TABLET | ORAL | 11 refills | Status: DC

## 2023-01-31 MED ORDER — BREZTRI AEROSPHERE 160-9-4.8 MCG/ACT IN AERO: INHALATION_SPRAY | RESPIRATORY_TRACT | 11 refills | Status: DC

## 2023-01-31 NOTE — Assessment & Plan Note (Signed)
4-5 min discussion re active cigarette smoking in addition to office E&M  Ask about tobacco use:   ongoing Advise quitting   I took an extended  opportunity with this patient to outline the consequences of continued cigarette use  in airway disorders based on all the data we have from the multiple national lung health studies (perfomed over decades at millions of dollars in cost)  indicating that smoking cessation, not choice of inhalers or pulmonary physicians, is the most important aspect of his  care.   Assess willingness:  Not committed at this point Assist in quit attempt:  encouraged to try nicotine patch as per PCP  Arrange follow up:   Follow up per Primary Care planned

## 2023-01-31 NOTE — Assessment & Plan Note (Addendum)
Active smoker -  Echo 11/29/22  1. Left ventricular ejection fraction, by estimation, is 25 to 30%. The  left ventricle has severely decreased function. The left ventricle  demonstrates global hypokinesis. Left ventricular diastolic parameters are  consistent with Grade III diastolic  dysfunction (restrictive). Elevated left atrial pressure.   2. Right ventricular systolic function is mildly reduced. The right  ventricular size is normal. Tricuspid regurgitation signal is inadequate  for assessing PA pressure.   3. Left atrial size was severely dilated.   4. Right atrial size was mildly dilated.   5. The mitral valve is abnormal. Trivial mitral valve regurgitation. No  evidence of mitral stenosis. Moderate mitral annular calcification.   6. The aortic valve was not well visualized but there appears to be  severe restriction of the aortic valve leaflet opening. There is severe  calcifcation of the aortic valve. Aortic valve regurgitation is not  visualized. Aortic valve mean gradient  measures 28.0 mmHg. Aortic valve Vmax measures 3.39 m/s. DVI is 0.21.  Overall, this is likely severe aortic valve stenosis, low flow-low  gradient in the setting of severely reduced LVEF.   7. The inferior vena cava is dilated in size with >50% respiratory  variability, suggesting right atrial pressure of 8 mmHg.  - CT chest 12/15/22 Mild paraseptal emphysema. - PFTs requested from [facility] 01/31/2023 >>>  - 01/31/2023  After extensive coaching inhaler device,  effectiveness =    60% > breztri trial  - 01/31/2023   Walked on RA  x  2  lap(s) =  approx 300  ft  @ mod /cane pace, stopped due to knee pain  with lowest 02 sats 94%    Clinically at least mod copd and likely  Group D (now reclassified as E) in terms of symptom/risk and laba/lama/ICS  therefore appropriate rx at this point >>>  breztri best choice since cough is such an issue suggesting AB or gerd component > rx empirically per AVS    Also possible there is component of cardiac asthma for which LAMA good choice.  F/u  6 weeks with all meds in hand using a trust but verify approach to confirm accurate Medication  Reconciliation The principal here is that until we are certain that the  patients are doing what we've asked, it makes no sense to ask them to do more.   Each maintenance medication was reviewed in detail including emphasizing most importantly the difference between maintenance and prns and under what circumstances the prns are to be triggered using an action plan format where appropriate.  Total time for H and P, chart review, counseling, reviewing hfa device(s) , directly observing portions of ambulatory 02 saturation study/ and generating customized AVS unique to this office visit / same day charting > 60 min complex new pt eval.

## 2023-01-31 NOTE — Patient Instructions (Addendum)
Plan A = Automatic = Always=   stop advair and start  Breztri Take 2 puffs first thing in am and then another 2 puffs about 12 hours later.    Work on inhaler technique:  relax and gently blow all the way out then take a nice smooth full deep breath back in, triggering the inhaler at same time you start breathing in.  Hold breath in for at least  5 seconds if you can. Blow out breztri thru nose. Rinse and gargle with water when done.  If mouth or throat bother you at all,  try brushing teeth/gums/tongue with arm and hammer toothpaste/ make a slurry and gargle and spit out.   >>>  Remember how golfers warm up by taking practice swings - do this with an empty inhaler    Plan B = Backup (to supplement plan A, not to replace it) Only use your albuterol inhaler as a rescue medication to be used if you can't catch your breath by resting or doing a relaxed purse lip breathing pattern.  - The less you use it, the better it will work when you need it. - Ok to use the inhaler up to 2 puffs  every 4 hours if you must but call for appointment if use goes up over your usual need - Don't leave home without it !!  (think of it like the spare tire for your car)   Plan C = Crisis (instead of Plan B but only if Plan B stops working) - only use your albuterol nebulizer if you first try Plan B and it fails to help > ok to use the nebulizer up to every 4 hours but if start needing it regularly call for immediate appointment    The key is to stop smoking completely before smoking completely stops you!   Omeprazole 20 mg change to take x 2 pills   x   30-60 min before first meal of the day and Pepcid (famotidine)  20 mg after supper until return to office - this is the best way to tell whether stomach acid is contributing to your problem.    GERD (REFLUX)  is an extremely common cause of respiratory symptoms just like yours , many times with no obvious heartburn at all.    It can be treated with medication, but  also with lifestyle changes including elevation of the head of your bed (ideally with 6 -8inch blocks under the headboard of your bed),  Smoking cessation, avoidance of late meals, excessive alcohol, and avoid fatty foods, chocolate, peppermint, colas, red wine, and acidic juices such as orange juice.  NO MINT OR MENTHOL PRODUCTS SO NO COUGH DROPS  USE SUGARLESS CANDY INSTEAD (Jolley ranchers or Stover's or Life Savers) or even ice chips will also do - the key is to swallow to prevent all throat clearing. NO OIL BASED VITAMINS - use powdered substitutes.  Avoid fish oil when coughing. -  Please schedule a follow up office visit in 6 weeks, call sooner if needed with all medications /inhalers/ solutions in hand so we can verify exactly what you are taking. This includes all medications from all doctors and over the counters

## 2023-02-08 ENCOUNTER — Ambulatory Visit: Payer: Medicare HMO | Attending: Internal Medicine | Admitting: Internal Medicine

## 2023-02-08 ENCOUNTER — Encounter: Payer: Self-pay | Admitting: Internal Medicine

## 2023-02-08 VITALS — BP 112/78 | HR 71 | Ht 71.0 in | Wt 189.2 lb

## 2023-02-08 DIAGNOSIS — I2510 Atherosclerotic heart disease of native coronary artery without angina pectoris: Secondary | ICD-10-CM | POA: Insufficient documentation

## 2023-02-08 DIAGNOSIS — I739 Peripheral vascular disease, unspecified: Secondary | ICD-10-CM | POA: Diagnosis not present

## 2023-02-08 DIAGNOSIS — I5022 Chronic systolic (congestive) heart failure: Secondary | ICD-10-CM | POA: Insufficient documentation

## 2023-02-08 DIAGNOSIS — I428 Other cardiomyopathies: Secondary | ICD-10-CM | POA: Insufficient documentation

## 2023-02-08 MED ORDER — FUROSEMIDE 20 MG PO TABS: 20.0000 mg | ORAL_TABLET | Freq: Every day | ORAL | 3 refills | Status: DC

## 2023-02-08 NOTE — Progress Notes (Signed)
Cardiology Office Note  Date: 02/08/2023   ID: Tommy Knight, DOB [birth year], MRN [id number]  PCP:  Erasmo Downer, NP  Cardiologist:  Marjo Bicker, MD Electrophysiologist:  None   History of Present Illness: Tommy Knight is a 77 y.o. male known to have mild to moderate nonobstructive CAD, severe upper extremity and lower extremity PAD (subclavian artery stenosis, bilateral common iliac artery stenosis, common femoral stenosis), severe aortic valve stenosis, nicotine abuse is here for follow-up visit.  Multidisciplinary imaging review conducted in October 2024 and the consensus was that patient is not a candidate for TAVR given significant PAD and carotid approach was not feasible due to disease at the ostium of the left carotid.  Medical management was strongly recommended and palliative care if necessary.  Patient is here for follow-up visit, accompanied by his wife.  He is not able to complete a full sentence without SOB.  No leg swelling.  No angina.  No dizziness, syncope.  No palpitations.  Compliant with medications and has no side effects.  He does have low blood pressures at home ranging between 90 and 100 mmHg SBP.  Past Medical History:  Diagnosis Date   Allergic rhinitis    Asthma    Carotid artery disease (HCC)    COPD (chronic obstructive pulmonary disease) (HCC)    Hyperlipidemia    Hypertension    Stenosis of left subclavian artery (HCC)    Stroke [facility])     Past Surgical History:  Procedure Laterality Date   APPENDECTOMY     RIGHT/LEFT HEART CATH AND CORONARY ANGIOGRAPHY N/A 12/02/2022   Procedure: RIGHT/LEFT HEART CATH AND CORONARY ANGIOGRAPHY;  Surgeon: Orbie Pyo, MD;  Location: [facility] INVASIVE CV LAB;  Service: Cardiovascular;  Laterality: N/A;    Current Outpatient Medications  Medication Sig Dispense Refill   albuterol (PROVENTIL HFA;VENTOLIN HFA) 108 (90 Base) MCG/ACT inhaler Inhale 2 puffs into the lungs every 4 (four) hours as needed for  wheezing or shortness of breath.      amLODipine (NORVASC) 5 MG tablet Take 5 mg by mouth daily.     aspirin EC 81 MG tablet Take 1 tablet (81 mg total) by mouth at bedtime. Swallow whole. 30 tablet 12   atorvastatin (LIPITOR) 40 MG tablet Take 40 mg by mouth every evening.     Budeson-Glycopyrrol-Formoterol (BREZTRI AEROSPHERE) 160-9-4.8 MCG/ACT AERO Take 2 puffs first thing in am and then another 2 puffs about 12 hours later. 10.7 g 11   clopidogrel (PLAVIX) 75 MG tablet Take 1 tablet (75 mg total) by mouth daily. 30 tablet 2   famotidine (PEPCID) 20 MG tablet One after supper 30 tablet 11   fluticasone (FLONASE) 50 MCG/ACT nasal spray Place into both nostrils as needed for allergies or rhinitis.     furosemide (LASIX) 20 MG tablet Take 1 tablet (20 mg total) by mouth daily as needed (As directed per cardiology for volume overload). 30 tablet 0   ipratropium-albuterol (DUONEB) 0.5-2.5 (3) MG/3ML SOLN Take 3 mLs by nebulization every 6 (six) hours as needed (for shortness of breath).     levocetirizine (XYZAL) 5 MG tablet Take 5 mg by mouth every evening.     montelukast (SINGULAIR) 10 MG tablet Take 10 mg by mouth every evening.     Multiple Vitamins-Minerals (CENTRUM SILVER 50+MEN) TABS Take 1 tablet by mouth daily.     Multiple Vitamins-Minerals (PRESERVISION AREDS 2) CAPS Take 1 capsule by mouth 2 (two) times daily.  omeprazole (PRILOSEC) 20 MG capsule Take 20 mg by mouth daily.     nicotine (NICODERM CQ - DOSED IN MG/24 HOURS) 21 mg/24hr patch Place 1 patch (21 mg total) onto the skin daily. (Patient not taking: Reported on 12/17/2022) 28 patch 0   No current facility-administered medications for this visit.   Allergies:  Patient has no known allergies.   Social History: The patient  reports that he has been smoking. He has never used smokeless tobacco. He reports current alcohol use.   Family History: The patient's family history includes Heart disease in an other family member.    ROS:  Please see the history of present illness. Otherwise, complete review of systems is positive for none.  All other systems are reviewed and negative.   Physical Exam: VS:  BP 112/78   Pulse 71   Ht 5\' 11"  (1.803 m)   Wt 189 lb 3.2 oz (85.8 kg)   SpO2 94%   BMI 26.39 kg/m , BMI Body mass index is 26.39 kg/m.  Wt Readings from Last 3 Encounters:  02/08/23 189 lb 3.2 oz (85.8 kg)  01/31/23 189 lb (85.7 kg)  12/17/22 186 lb (84.4 kg)    General: Patient appears comfortable at rest. HEENT: Conjunctiva and lids normal, oropharynx clear with moist mucosa. Neck: Supple, no elevated JVP or carotid bruits, no thyromegaly. Lungs: Clear to auscultation, nonlabored breathing at rest. Cardiac: Regular rate and rhythm, no S3 or significant systolic murmur, no pericardial rub. Abdomen: Soft, nontender, no hepatomegaly, bowel sounds present, no guarding or rebound. Extremities: No pitting edema, distal pulses 2+. Skin: Warm and dry. Musculoskeletal: No kyphosis. Neuropsychiatric: Alert and oriented x3, affect grossly appropriate.  Recent Labwork: 11/28/2022: ALT 25; AST 31 11/30/2022: TSH 2.215 12/02/2022: B Natriuretic Peptide 1,087.0 12/03/2022: Hemoglobin 15.8; Magnesium 1.7; Platelets 364 12/15/2022: BUN 20; Creatinine, Ser 1.41; Potassium 4.7; Sodium 135     Component Value Date/Time   CHOL 195 12/01/2022 0425   TRIG 111 12/01/2022 0425   HDL 52 12/01/2022 0425   CHOLHDL 3.8 12/01/2022 0425   VLDL 22 12/01/2022 0425   LDLCALC 121 (H) 12/01/2022 0425     Assessment and Plan:  Severe aortic valve stenosis with CDM LVEF 25 to 30% (not a TAVR candidate) Severe upper and lower extremity PAD Mild to moderate nonobstructive CAD Nicotine abuse   -Patient was hospitalized at [facility] in 9/24 for ADHF with new CDM LVEF 25 to 30% and was found to have new severe aortic valve stenosis. Due to severe upper and lower extremity PAD, he was deemed not a candidate for TAVR based on consensus  from multi-disciplinary team meeting. He continues to have SOB, cannot complete a full sentence without feeling short of breath.  Discontinue amlodipine and start p.o. Lasix 20 mg once daily.  Unable to uptitrate GDMT due to soft blood pressures. He did cut back on smoking quite a bit after he got discharged and currently on nicotine patches.  I discussed in detail about the prognosis of his medical condition, severe aortic valve stenosis with CDM and not being a TAVR candidate.  He has a good insight and verbalized understanding.  I encouraged him to see palliative care team but he wanted to put this off until he completed paying medical bills.  He requested me to fill disability paperwork which he will mail to [place].  I spent 30 minutes reviewing prior imaging studies, discussed in detail about prognosis of his medical condition, severe aortic valve stenosis with CDM  and not being a TAVR candidate, management of heart failure.  Reviewed his medications and documented the findings in the note.   Medication Adjustments/Labs and Tests Ordered: Current medicines are reviewed at length with the patient today.  Concerns regarding medicines are outlined above.    Disposition:  Follow up 3 months  Signed, Lorriann Hansmann Verne Spurr, MD, 02/08/2023 3:16 PM     [facility] [address] [place], [place] [address]

## 2023-02-08 NOTE — Patient Instructions (Signed)
Medication Instructions:  Your physician has recommended you make the following change in your medication:   -Stop Amlodipine -Increase Lasix to 20 mg once daily.   *If you need a refill on your cardiac medications before your next appointment, please call your pharmacy*   Lab Work: None If you have labs (blood work) drawn today and your tests are completely normal, you will receive your results only by: MyChart Message (if you have MyChart) OR A paper copy in the mail If you have any lab test that is abnormal or we need to change your treatment, we will call you to review the results.   Testing/Procedures: None   Follow-Up: At [facility], you and your health needs are our priority.  As part of our continuing mission to provide you with exceptional heart care, we have created designated Provider Care Teams.  These Care Teams include your primary Cardiologist (physician) and Advanced Practice Providers (APPs -  Physician Assistants and Nurse Practitioners) who all work together to provide you with the care you need, when you need it.  We recommend signing up for the patient portal called "MyChart".  Sign up information is provided on this After Visit Summary.  MyChart is used to connect with patients for Virtual Visits (Telemedicine).  Patients are able to view lab/test results, encounter notes, upcoming appointments, etc.  Non-urgent messages can be sent to your provider as well.   To learn more about what you can do with MyChart, go to [email or web address].    Your next appointment:   3 month(s)  Provider:   You may see Vishnu P Mallipeddi, MD or one of the following Advanced Practice Providers on your designated Care Team:   [place], PA-C  Jacolyn Reedy, [place]     Other Instructions

## 2023-03-13 NOTE — Progress Notes (Unsigned)
Tommy Knight, male    DOB: Feb 24, 1946    MRN: [id number]   Brief patient profile:  36  yowm  active smoker with onset allergies 60s mostly allergies in spring = rhinitis and then sev years later started needing nebulizer  referred to pulmonary clinic in [facility]  01/31/2023 by Ronnell Freshwater who is treating him for Aortic stenosis since around 11/2022 and admitted with decompensation      History of Present Illness  01/31/2023  Pulmonary/ 1st office eval/ [name] / [facility] off acei x early September 2024   Chief Complaint  Patient presents with   Establish Care   Shortness of Breath  Dyspnea:  shop and back 100 ft slt hills stops half way both ways  Cough: brown mucus worse all day long  Sleep: < 30 degrees / 1 pillow  SABA use: neb bid/ hfa all the day  02: none Rec Plan A = Automatic = Always=   stop advair and start  Breztri Take 2 puffs first thing in am and then another 2 puffs about 12 hours later.   Work on inhaler technique:  >>>  Remember how golfers warm up by taking practice swings - do this with an empty inhaler  Plan B = Backup (to supplement plan A, not to replace it) Only use your albuterol inhaler as a rescue medication Plan C = Crisis (instead of Plan B but only if Plan B stops working) - only use your albuterol nebulizer if you first try Plan B   The key is to stop smoking completely before smoking completely stops you! Omeprazole 20 mg change to take x 2 pills   x   30-60 min before first meal of the day and Pepcid (famotidine)  20 mg after supper until return to office GERD diet reviewed, bed blocks rec    Please schedule a follow up office visit in 6 weeks, call sooner if needed with all medications /inhalers/ solutions in hand so we can verify exactly what you are taking. This includes all medications from all doctors and over the counters    03/14/2023  f/u ov/[facility]/Elliett Guarisco re: *** maint on *** did ** bring mes  No chief complaint on file.    Dyspnea:  *** Cough: *** Sleeping: ***   resp cc  SABA use: *** 02: ***  Lung cancer screening: ***   No obvious day to day or daytime variability or assoc excess/ purulent sputum or mucus plugs or hemoptysis or cp or chest tightness, subjective wheeze or overt sinus or hb symptoms.    Also denies any obvious fluctuation of symptoms with weather or environmental changes or other aggravating or alleviating factors except as outlined above   No unusual exposure hx or h/o childhood pna/ asthma or knowledge of premature birth.  Current Allergies, Complete Past Medical History, Past Surgical History, Family History, and Social History were reviewed in [employer] record.  ROS  The following are not active complaints unless bolded Hoarseness, sore throat, dysphagia, dental problems, itching, sneezing,  nasal congestion or discharge of excess mucus or purulent secretions, ear ache,   fever, chills, sweats, unintended wt loss or wt gain, classically pleuritic or exertional cp,  orthopnea pnd or arm/hand swelling  or leg swelling, presyncope, palpitations, abdominal pain, anorexia, nausea, vomiting, diarrhea  or change in bowel habits or change in bladder habits, change in stools or change in urine, dysuria, hematuria,  rash, arthralgias, visual complaints, headache, numbness, weakness or ataxia  or problems with walking or coordination,  change in mood or  memory.        No outpatient medications have been marked as taking for the 03/14/23 encounter (Appointment) with Nyoka Cowden, MD.            Past Medical History:  Diagnosis Date   Allergic rhinitis    Asthma    Carotid artery disease (HCC)    COPD (chronic obstructive pulmonary disease) (HCC)    Hyperlipidemia    Hypertension    Stenosis of left subclavian artery (HCC)    Stroke (HCC)       Objective:    Wt Readings from Last 3 Encounters:  02/08/23 189 lb 3.2 oz (85.8 kg)  01/31/23 189 lb (85.7 kg)   12/17/22 186 lb (84.4 kg)      Vital signs reviewed  03/14/2023  - Note at rest 02 sats  ***% on ***   General appearance:    ***   edentulous    Mod barr ***             I personally reviewed images and agree with radiology impression as follows:   Chest CTcuts on coronary study    12/15/22  Central airways are patent. No consolidation, pleural effusion or pneumothorax. Mild ground-glass opacities of the left-greater-than-right lower lobes, likely due to scarring or atelectasis. Mild paraseptal emphysema. Calcified nodule of the right upper lobe, likely sequela of prior granulomatous infection. Trace left pleural effusion.   Assessment

## 2023-03-14 ENCOUNTER — Ambulatory Visit: Payer: Medicare HMO | Admitting: Internal Medicine

## 2023-03-14 ENCOUNTER — Encounter: Payer: Self-pay | Admitting: Internal Medicine

## 2023-03-14 VITALS — BP 108/73 | HR 75 | Ht 71.0 in | Wt 190.4 lb

## 2023-03-14 DIAGNOSIS — R0609 Other forms of dyspnea: Secondary | ICD-10-CM | POA: Diagnosis not present

## 2023-03-14 DIAGNOSIS — F17210 Nicotine dependence, cigarettes, uncomplicated: Secondary | ICD-10-CM

## 2023-03-14 NOTE — Assessment & Plan Note (Addendum)
Active smoker -  Echo 11/29/22  1. Left ventricular ejection fraction, by estimation, is 25 to 30%. The  left ventricle has severely decreased function. The left ventricle  demonstrates global hypokinesis. Left ventricular diastolic parameters are  consistent with Grade III diastolic  dysfunction (restrictive). Elevated left atrial pressure.   2. Right ventricular systolic function is mildly reduced. The right  ventricular size is normal. Tricuspid regurgitation signal is inadequate  for assessing PA pressure.   3. Left atrial size was severely dilated.   4. Right atrial size was mildly dilated.   5. The mitral valve is abnormal. Trivial mitral valve regurgitation. No  evidence of mitral stenosis. Moderate mitral annular calcification.   6. The aortic valve was not well visualized but there appears to be  severe restriction of the aortic valve leaflet opening. There is severe  calcifcation of the aortic valve. Aortic valve regurgitation is not  visualized. Aortic valve mean gradient  measures 28.0 mmHg. Aortic valve Vmax measures 3.39 m/s. DVI is 0.21.  Overall, this is likely severe aortic valve stenosis, low flow-low  gradient in the setting of severely reduced LVEF.   7. The inferior vena cava is dilated in size with >50% respiratory  variability, suggesting right atrial pressure of 8 mmHg.  - CT chest 12/15/22 Mild paraseptal emphysema. - PFTs requested from [facility] 01/31/2023 and again 03/14/2023  - 01/31/2023  After extensive coaching inhaler device,  effectiveness =    60% > breztri trial  - 01/31/2023   Walked on RA  x  2  lap(s) =  approx 300  ft  @ mod /cane pace, stopped due to knee pain  with lowest 02 sats 94%   - 03/14/2023   Walked on RA  x  3  lap(s) =  approx 450  ft  @ slow  pace, stopped due to end of study, legs sore,  with lowest 02 sats 93%   More than likely slowed by AS and claudication/ deconditioning at this point and not doing any better on Breztri  which may be compromising his urinary flow so try back on advair 250 one bid with more approp saba:  Re SABA :  I spent extra time with pt today reviewing appropriate use of albuterol for prn use on exertion with the following points: 1) saba is for relief of sob that does not improve by walking a slower pace or resting but rather if the pt does not improve after trying this first. 2) If the pt is convinced, as many are, that saba helps recover from activity faster then it's easy to tell if this is the case by re-challenging : ie stop, take the inhaler, then p 5 minutes try the exact same activity (intensity of workload) that just caused the symptoms and see if they are substantially diminished or not after saba 3) if there is an activity that reproducibly causes the symptoms, try the saba 15 min before the activity on alternate days   If in fact the saba really does help, then fine to continue to use it prn but advised may need to look closer at the maintenance regimen being used to achieve better control of airways disease with exertion.   >>>> no change rx for now/ pfts on return in 3 m

## 2023-03-15 ENCOUNTER — Telehealth: Payer: Self-pay | Admitting: Internal Medicine

## 2023-03-15 ENCOUNTER — Other Ambulatory Visit: Payer: Self-pay

## 2023-03-15 DIAGNOSIS — R0609 Other forms of dyspnea: Secondary | ICD-10-CM

## 2023-03-15 DIAGNOSIS — F17210 Nicotine dependence, cigarettes, uncomplicated: Secondary | ICD-10-CM

## 2023-03-15 NOTE — Assessment & Plan Note (Signed)
 Counseled re importance of smoking cessation but did not meet time criteria for separate billing    Each maintenance medication was reviewed in detail including emphasizing most importantly the difference between maintenance and prns and under what circumstances the prns are to be triggered using an action plan format where appropriate.  Total time for H and P, chart review, counseling, reviewing hfa/neb/pulse ox device(s) , directly observing portions of ambulatory 02 saturation study/ and generating customized AVS unique to this office visit / same day charting > 40 min for  refractory respiratory  symptoms of uncertain etiology

## 2023-03-15 NOTE — Telephone Encounter (Signed)
 Needs pfts ordered hopefully same day as return appt in 3 m

## 2023-03-15 NOTE — Telephone Encounter (Signed)
Placed order for PFT to [facility]

## 2023-04-21 ENCOUNTER — Ambulatory Visit (HOSPITAL_COMMUNITY): Admission: RE | Admit: 2023-04-21 | Payer: Medicare Other | Source: Ambulatory Visit

## 2023-05-02 ENCOUNTER — Inpatient Hospital Stay (HOSPITAL_COMMUNITY)
Admission: EM | Admit: 2023-05-02 | Discharge: 2023-05-04 | DRG: 291 | Disposition: A | Discharge status: Home / Self Care | Payer: Medicare Other | Attending: Internal Medicine | Admitting: Internal Medicine

## 2023-05-02 ENCOUNTER — Encounter (HOSPITAL_COMMUNITY): Payer: Self-pay

## 2023-05-02 ENCOUNTER — Other Ambulatory Visit: Payer: Self-pay

## 2023-05-02 ENCOUNTER — Emergency Department (HOSPITAL_COMMUNITY): Payer: Medicare Other

## 2023-05-02 DIAGNOSIS — Z8673 Personal history of transient ischemic attack (TIA), and cerebral infarction without residual deficits: Secondary | ICD-10-CM | POA: Diagnosis not present

## 2023-05-02 DIAGNOSIS — J441 Chronic obstructive pulmonary disease with (acute) exacerbation: Secondary | ICD-10-CM | POA: Diagnosis present

## 2023-05-02 DIAGNOSIS — I6523 Occlusion and stenosis of bilateral carotid arteries: Secondary | ICD-10-CM | POA: Diagnosis present

## 2023-05-02 DIAGNOSIS — I509 Heart failure, unspecified: Principal | ICD-10-CM

## 2023-05-02 DIAGNOSIS — F17200 Nicotine dependence, unspecified, uncomplicated: Secondary | ICD-10-CM | POA: Diagnosis not present

## 2023-05-02 DIAGNOSIS — J90 Pleural effusion, not elsewhere classified: Secondary | ICD-10-CM

## 2023-05-02 DIAGNOSIS — J449 Chronic obstructive pulmonary disease, unspecified: Secondary | ICD-10-CM | POA: Diagnosis not present

## 2023-05-02 DIAGNOSIS — Z515 Encounter for palliative care: Secondary | ICD-10-CM

## 2023-05-02 DIAGNOSIS — I350 Nonrheumatic aortic (valve) stenosis: Secondary | ICD-10-CM

## 2023-05-02 DIAGNOSIS — Z91030 Bee allergy status: Secondary | ICD-10-CM | POA: Diagnosis not present

## 2023-05-02 DIAGNOSIS — I959 Hypotension, unspecified: Secondary | ICD-10-CM | POA: Diagnosis present

## 2023-05-02 DIAGNOSIS — Z7952 Long term (current) use of systemic steroids: Secondary | ICD-10-CM

## 2023-05-02 DIAGNOSIS — I10 Essential (primary) hypertension: Secondary | ICD-10-CM | POA: Diagnosis not present

## 2023-05-02 DIAGNOSIS — Z7902 Long term (current) use of antithrombotics/antiplatelets: Secondary | ICD-10-CM | POA: Diagnosis not present

## 2023-05-02 DIAGNOSIS — Z716 Tobacco abuse counseling: Secondary | ICD-10-CM | POA: Diagnosis not present

## 2023-05-02 DIAGNOSIS — I2510 Atherosclerotic heart disease of native coronary artery without angina pectoris: Secondary | ICD-10-CM | POA: Diagnosis present

## 2023-05-02 DIAGNOSIS — E785 Hyperlipidemia, unspecified: Secondary | ICD-10-CM | POA: Diagnosis present

## 2023-05-02 DIAGNOSIS — I130 Hypertensive heart and chronic kidney disease with heart failure and stage 1 through stage 4 chronic kidney disease, or unspecified chronic kidney disease: Principal | ICD-10-CM | POA: Diagnosis present

## 2023-05-02 DIAGNOSIS — N400 Enlarged prostate without lower urinary tract symptoms: Secondary | ICD-10-CM | POA: Diagnosis present

## 2023-05-02 DIAGNOSIS — Z66 Do not resuscitate: Secondary | ICD-10-CM | POA: Diagnosis present

## 2023-05-02 DIAGNOSIS — Z7951 Long term (current) use of inhaled steroids: Secondary | ICD-10-CM | POA: Diagnosis not present

## 2023-05-02 DIAGNOSIS — Z1152 Encounter for screening for COVID-19: Secondary | ICD-10-CM | POA: Diagnosis not present

## 2023-05-02 DIAGNOSIS — Z7982 Long term (current) use of aspirin: Secondary | ICD-10-CM

## 2023-05-02 DIAGNOSIS — I5023 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) heart failure: Secondary | ICD-10-CM | POA: Diagnosis present

## 2023-05-02 DIAGNOSIS — F17210 Nicotine dependence, cigarettes, uncomplicated: Secondary | ICD-10-CM | POA: Diagnosis present

## 2023-05-02 DIAGNOSIS — D72829 Elevated white blood cell count, unspecified: Secondary | ICD-10-CM | POA: Diagnosis present

## 2023-05-02 DIAGNOSIS — Z79899 Other long term (current) drug therapy: Secondary | ICD-10-CM | POA: Diagnosis not present

## 2023-05-02 DIAGNOSIS — I739 Peripheral vascular disease, unspecified: Secondary | ICD-10-CM | POA: Diagnosis present

## 2023-05-02 DIAGNOSIS — K219 Gastro-esophageal reflux disease without esophagitis: Secondary | ICD-10-CM | POA: Diagnosis present

## 2023-05-02 DIAGNOSIS — I493 Ventricular premature depolarization: Secondary | ICD-10-CM | POA: Diagnosis present

## 2023-05-02 DIAGNOSIS — N183 Chronic kidney disease, stage 3 unspecified: Secondary | ICD-10-CM | POA: Diagnosis present

## 2023-05-02 DIAGNOSIS — N1831 Chronic kidney disease, stage 3a: Secondary | ICD-10-CM | POA: Diagnosis present

## 2023-05-02 DIAGNOSIS — Z7189 Other specified counseling: Secondary | ICD-10-CM | POA: Diagnosis not present

## 2023-05-02 LAB — CBC WITH DIFFERENTIAL/PLATELET
Abs Immature Granulocytes: 0.03 10*3/uL (ref 0.00–0.07)
Basophils Absolute: 0 10*3/uL (ref 0.0–0.1)
Basophils Relative: 0 %
Eosinophils Absolute: 0.1 10*3/uL (ref 0.0–0.5)
Eosinophils Relative: 1 %
HCT: 42 % (ref 39.0–52.0)
Hemoglobin: 13.4 g/dL (ref 13.0–17.0)
Immature Granulocytes: 0 %
Lymphocytes Relative: 8 %
Lymphs Abs: 0.8 10*3/uL (ref 0.7–4.0)
MCH: 27.3 pg (ref 26.0–34.0)
MCHC: 31.9 g/dL (ref 30.0–36.0)
MCV: 85.7 fL (ref 80.0–100.0)
Monocytes Absolute: 0.8 10*3/uL (ref 0.1–1.0)
Monocytes Relative: 7 %
Neutro Abs: 9 10*3/uL — ABNORMAL HIGH (ref 1.7–7.7)
Neutrophils Relative %: 84 %
Platelets: 324 10*3/uL (ref 150–400)
RBC: 4.9 MIL/uL (ref 4.22–5.81)
RDW: 13.4 % (ref 11.5–15.5)
WBC: 10.8 10*3/uL — ABNORMAL HIGH (ref 4.0–10.5)
nRBC: 0 % (ref 0.0–0.2)

## 2023-05-02 LAB — RESP PANEL BY RT-PCR (RSV, FLU A&B, COVID)  RVPGX2
Influenza A by PCR: NEGATIVE
Influenza B by PCR: NEGATIVE
Resp Syncytial Virus by PCR: NEGATIVE
SARS Coronavirus 2 by RT PCR: NEGATIVE

## 2023-05-02 LAB — BASIC METABOLIC PANEL
Anion gap: 14 (ref 5–15)
BUN: 29 mg/dL — ABNORMAL HIGH (ref 8–23)
CO2: 23 mmol/L (ref 22–32)
Calcium: 8.8 mg/dL — ABNORMAL LOW (ref 8.9–10.3)
Chloride: 101 mmol/L (ref 98–111)
Creatinine, Ser: 1.63 mg/dL — ABNORMAL HIGH (ref 0.61–1.24)
GFR, Estimated: 43 mL/min — ABNORMAL LOW (ref >60)
Glucose, Bld: 109 mg/dL — ABNORMAL HIGH (ref 70–99)
Potassium: 4.4 mmol/L (ref 3.5–5.1)
Sodium: 138 mmol/L (ref 135–145)

## 2023-05-02 LAB — TROPONIN I (HIGH SENSITIVITY)
Troponin I (High Sensitivity): 53 ng/L — ABNORMAL HIGH (ref <18)
Troponin I (High Sensitivity): 52 ng/L — ABNORMAL HIGH (ref <18)

## 2023-05-02 LAB — BRAIN NATRIURETIC PEPTIDE: B Natriuretic Peptide: >4500 pg/mL — ABNORMAL HIGH (ref 0.0–100.0)

## 2023-05-02 MED ORDER — ACETAMINOPHEN 325 MG PO TABS
650.0000 mg | ORAL_TABLET | Freq: Four times a day (QID) | ORAL | Status: DC | PRN
  Administered 2023-05-03 – 2023-05-04 (×2): 650 mg via ORAL
  Filled 2023-05-02 (×2): qty 2

## 2023-05-02 MED ORDER — IPRATROPIUM-ALBUTEROL 0.5-2.5 (3) MG/3ML IN SOLN
3.0000 mL | Freq: Once | RESPIRATORY_TRACT | Status: AC
  Administered 2023-05-02: 3 mL via RESPIRATORY_TRACT
  Filled 2023-05-02: qty 3

## 2023-05-02 MED ORDER — MOMETASONE FURO-FORMOTEROL FUM 200-5 MCG/ACT IN AERO
2.0000 | INHALATION_SPRAY | Freq: Two times a day (BID) | RESPIRATORY_TRACT | Status: DC
  Administered 2023-05-02 – 2023-05-04 (×4): 2 via RESPIRATORY_TRACT
  Filled 2023-05-02: qty 8.8

## 2023-05-02 MED ORDER — FUROSEMIDE 10 MG/ML IJ SOLN
20.0000 mg | Freq: Once | INTRAMUSCULAR | Status: AC
  Administered 2023-05-02: 20 mg via INTRAVENOUS
  Filled 2023-05-02: qty 2

## 2023-05-02 MED ORDER — ENOXAPARIN SODIUM 40 MG/0.4ML IJ SOSY
40.0000 mg | PREFILLED_SYRINGE | INTRAMUSCULAR | Status: DC
  Administered 2023-05-03 – 2023-05-04 (×2): 40 mg via SUBCUTANEOUS
  Filled 2023-05-02 (×2): qty 0.4

## 2023-05-02 MED ORDER — IPRATROPIUM-ALBUTEROL 0.5-2.5 (3) MG/3ML IN SOLN: 3.0000 mL | Freq: Four times a day (QID) | RESPIRATORY_TRACT | Status: DC | PRN

## 2023-05-02 MED ORDER — FUROSEMIDE 10 MG/ML IJ SOLN
40.0000 mg | Freq: Two times a day (BID) | INTRAMUSCULAR | Status: DC
  Administered 2023-05-03: 40 mg via INTRAVENOUS
  Filled 2023-05-02: qty 4

## 2023-05-02 MED ORDER — ONDANSETRON HCL 4 MG PO TABS: 4.0000 mg | ORAL_TABLET | Freq: Four times a day (QID) | ORAL | Status: DC | PRN

## 2023-05-02 MED ORDER — FUROSEMIDE 10 MG/ML IJ SOLN
40.0000 mg | Freq: Once | INTRAMUSCULAR | Status: AC
  Administered 2023-05-02: 40 mg via INTRAVENOUS
  Filled 2023-05-02: qty 4

## 2023-05-02 MED ORDER — ASPIRIN 81 MG PO TBEC
81.0000 mg | DELAYED_RELEASE_TABLET | Freq: Every day | ORAL | Status: DC
  Administered 2023-05-03: 81 mg via ORAL
  Filled 2023-05-02: qty 1

## 2023-05-02 MED ORDER — ONDANSETRON HCL 4 MG/2ML IJ SOLN: 4.0000 mg | Freq: Four times a day (QID) | INTRAMUSCULAR | Status: DC | PRN

## 2023-05-02 MED ORDER — ACETAMINOPHEN 650 MG RE SUPP: 650.0000 mg | Freq: Four times a day (QID) | RECTAL | Status: DC | PRN

## 2023-05-02 MED ORDER — GUAIFENESIN-DM 100-10 MG/5ML PO SYRP
15.0000 mL | ORAL_SOLUTION | Freq: Three times a day (TID) | ORAL | Status: AC
  Administered 2023-05-02 – 2023-05-03 (×3): 15 mL via ORAL
  Filled 2023-05-02 (×3): qty 15

## 2023-05-02 MED ORDER — ALBUTEROL SULFATE HFA 108 (90 BASE) MCG/ACT IN AERS
2.0000 | INHALATION_SPRAY | RESPIRATORY_TRACT | Status: DC | PRN
  Administered 2023-05-02: 2 via RESPIRATORY_TRACT
  Filled 2023-05-02: qty 6.7

## 2023-05-02 MED ORDER — POLYETHYLENE GLYCOL 3350 17 G PO PACK: 17.0000 g | PACK | Freq: Every day | ORAL | Status: DC | PRN

## 2023-05-02 MED ORDER — ATORVASTATIN CALCIUM 40 MG PO TABS
40.0000 mg | ORAL_TABLET | Freq: Every evening | ORAL | Status: DC
  Administered 2023-05-02 – 2023-05-03 (×2): 40 mg via ORAL
  Filled 2023-05-02 (×2): qty 1

## 2023-05-02 MED ORDER — METHYLPREDNISOLONE SODIUM SUCC 125 MG IJ SOLR
125.0000 mg | Freq: Once | INTRAMUSCULAR | Status: AC
  Administered 2023-05-02: 125 mg via INTRAVENOUS
  Filled 2023-05-02: qty 2

## 2023-05-02 NOTE — Assessment & Plan Note (Addendum)
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  Recommend inpatient palliative consultation.  Dr.Mallipeddi's note, patient also with severe upper and lower extremity peripheral artery disease, so not deemed a candidate for TAVR based on consensus from multidisciplinary team meeting.

## 2023-05-02 NOTE — ED Notes (Deleted)
ED TO INPATIENT HANDOFF REPORT  ED Nurse Name and Phone #:  Jeanella Anton [phone]  S Name/Age/Gender Rande Lawman 78 y.o. male Room/Bed: APA17/APA17  Code Status   Code Status: Full Code  Home/SNF/Other Home Patient oriented to: self, place, and situation Is this baseline? Yes   Triage Complete: Triage complete  Chief Complaint 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) heart failure (HCC) [I50.23]  Triage Note Pt arrived via POV c/o bilateral lower extremity swelling X 2 weeks, recent PNA infection and reports being seen by his PCP and was told he has fluid in his lungs. Pt also reports cracked ribs on his right side due to excessive coughing and sneezing.    Allergies No Known Allergies  Level of Care/Admitting Diagnosis ED Disposition     ED Disposition  Admit   Condition  --   Comment  Hospital Area: [facility] [[id number]]  Level of Care: Telemetry [5]  Covid Evaluation: Asymptomatic - no recent exposure (last 10 days) testing not required  Diagnosis: 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) heart failure [facility]) [[id number]]  Admitting Physician: Cresenciano Lick  Attending Physician: Onnie Boer [id number]  Certification:: I certify this patient will need inpatient services for at least 2 midnights  Expected Medical Readiness: 05/04/2023          B Medical/Surgery History Past Medical History:  Diagnosis Date   Allergic rhinitis    Asthma    Carotid artery disease (HCC)    COPD (chronic obstructive pulmonary disease) (HCC)    Hyperlipidemia    Hypertension    Stenosis of left subclavian artery (HCC)    Stroke [facility])    Past Surgical History:  Procedure Laterality Date   APPENDECTOMY     RIGHT/LEFT HEART CATH AND CORONARY ANGIOGRAPHY N/A 12/02/2022   Procedure: RIGHT/LEFT HEART CATH AND CORONARY ANGIOGRAPHY;  Surgeon: Orbie Pyo, MD;  Location: [facility] INVASIVE CV LAB;  Service: Cardiovascular;  Laterality: N/A;     A IV  Location/Drains/Wounds Patient Lines/Drains/Airways Status     Active Line/Drains/Airways     Name Placement date Placement time Site Days   Peripheral IV 05/02/23 20 G Anterior;Proximal;Right Forearm 05/02/23  1530  Forearm  less than 1            Intake/Output Last 24 hours  Intake/Output Summary (Last 24 hours) at 05/02/2023 2051 Last data filed at 05/02/2023 1908 Gross per 24 hour  Intake --  Output 1000 ml  Net -1000 ml    Labs/Imaging Results for orders placed or performed during the hospital encounter of 05/02/23 (from the past 48 hours)  CBC with Differential     Status: Abnormal   Collection Time: 05/02/23 12:29 PM  Result Value Ref Range   WBC 10.8 (H) 4.0 - 10.5 K/uL   RBC 4.90 4.22 - 5.81 MIL/uL   Hemoglobin 13.4 13.0 - 17.0 g/dL   HCT 69.6 29.5 - 28.4 %   MCV 85.7 80.0 - 100.0 fL   MCH 27.3 26.0 - 34.0 pg   MCHC 31.9 30.0 - 36.0 g/dL   RDW 13.2 44.0 - 10.2 %   Platelets 324 150 - 400 K/uL   nRBC 0.0 0.0 - 0.2 %   Neutrophils Relative % 84 %   Neutro Abs 9.0 (H) 1.7 - 7.7 K/uL   Lymphocytes Relative 8 %   Lymphs Abs 0.8 0.7 - 4.0 K/uL   Monocytes Relative 7 %   Monocytes Absolute 0.8 0.1 - 1.0 K/uL   Eosinophils Relative  1 %   Eosinophils Absolute 0.1 0.0 - 0.5 K/uL   Basophils Relative 0 %   Basophils Absolute 0.0 0.0 - 0.1 K/uL   Immature Granulocytes 0 %   Abs Immature Granulocytes 0.03 0.00 - 0.07 K/uL    Comment: Performed at [facility], [address] [place], [place] [address]  Basic metabolic panel     Status: Abnormal   Collection Time: 05/02/23 12:29 PM  Result Value Ref Range   Sodium 138 135 - 145 mmol/L   Potassium 4.4 3.5 - 5.1 mmol/L   Chloride 101 98 - 111 mmol/L   CO2 23 22 - 32 mmol/L   Glucose, Bld 109 (H) 70 - 99 mg/dL    Comment: Glucose reference range applies only to samples taken after fasting for at least 8 hours.   BUN 29 (H) 8 - 23 mg/dL   Creatinine, Ser 6.04 (H) 0.61 - 1.24 mg/dL   Calcium 8.8 (L) 8.9 - 10.3 mg/dL    GFR, Estimated 43 (L) >60 mL/min    Comment: (NOTE) Calculated using the CKD-EPI Creatinine Equation (2021)    Anion gap 14 5 - 15    Comment: Performed at [facility], [address] [place], [place] [address]  Brain natriuretic peptide     Status: Abnormal   Collection Time: 05/02/23 12:29 PM  Result Value Ref Range   B Natriuretic Peptide >4,500.0 (H) 0.0 - 100.0 pg/mL    Comment: Performed at [facility], [address], [place], [place] [address]  Troponin I (High Sensitivity)     Status: Abnormal   Collection Time: 05/02/23  3:33 PM  Result Value Ref Range   Troponin I (High Sensitivity) 53 (H) <18 ng/L    Comment: (NOTE) Elevated high sensitivity troponin I (hsTnI) values and significant  changes across serial measurements may suggest ACS but many other  chronic and acute conditions are known to elevate hsTnI results.  Refer to the "Links" section for chest pain algorithms and additional  guidance. Performed at [facility] [address] [place], [place] [address]   Troponin I (High Sensitivity)     Status: Abnormal   Collection Time: 05/02/23  5:07 PM  Result Value Ref Range   Troponin I (High Sensitivity) 52 (H) <18 ng/L    Comment: (NOTE) Elevated high sensitivity troponin I (hsTnI) values and significant  changes across serial measurements may suggest ACS but many other  chronic and acute conditions are known to elevate hsTnI results.  Refer to the "Links" section for chest pain algorithms and additional  guidance. Performed at [facility], [address] [place], [place] [address]    DG Chest 2 View Result Date: 05/02/2023 CLINICAL DATA:  Shortness of breath. EXAM: CHEST - 2 VIEW COMPARISON:  CT angiography chest from 12/15/2022. FINDINGS: There is new moderate left pleural effusion with probable underlying compressive atelectatic changes and/or consolidation. There are increased interstitial markings throughout bilateral lungs, which is similar to the prior  study. No pulmonary edema. Right lung and right lateral costophrenic angle are clear. Evaluation of cardiomediastinal silhouette is nondiagnostic due to left lower hemithorax opacification. No acute osseous abnormalities. The soft tissues are within normal limits. IMPRESSION: *New moderate left pleural effusion. Electronically Signed   By: Jules Schick M.D.   On: 05/02/2023 15:13    Pending Labs Unresulted Labs (From admission, onward)     Start     Ordered   05/03/23 0500  Basic metabolic panel  Daily,   R  05/02/23 1953   05/02/23 1928  Resp panel by RT-PCR (RSV, Flu A&B, Covid) Anterior Nasal Swab  (Resp Panel by RT-PCR (RSV, Flu A&B, Covid) with precautions)  Once,   R        05/02/23 1928            Vitals/Pain Today's Vitals   05/02/23 1545 05/02/23 1600 05/02/23 1615 05/02/23 1745  BP: (!) 124/100 (!) 122/97 130/88 (!) 127/92  Pulse: 91 91 90 93  Resp: (!) 26 (!) 33 (!) 26 (!) 28  Temp:   98.1 F (36.7 C)   TempSrc:   Oral   SpO2: 97% 95% 94% 90%  Weight:      Height:      PainSc:   0-No pain     Isolation Precautions No active isolations  Medications Medications  albuterol (VENTOLIN HFA) 108 (90 Base) MCG/ACT inhaler 2 puff (2 puffs Inhalation Given 05/02/23 1205)  guaiFENesin-dextromethorphan (ROBITUSSIN DM) 100-10 MG/5ML syrup 15 mL (has no administration in time range)  aspirin EC tablet 81 mg (has no administration in time range)  atorvastatin (LIPITOR) tablet 40 mg (has no administration in time range)  mometasone-formoterol (DULERA) 200-5 MCG/ACT inhaler 2 puff (has no administration in time range)  ipratropium-albuterol (DUONEB) 0.5-2.5 (3) MG/3ML nebulizer solution 3 mL (has no administration in time range)  furosemide (LASIX) injection 40 mg (has no administration in time range)  enoxaparin (LOVENOX) injection 40 mg (has no administration in time range)  acetaminophen (TYLENOL) tablet 650 mg (has no administration in time range)    Or   acetaminophen (TYLENOL) suppository 650 mg (has no administration in time range)  ondansetron (ZOFRAN) tablet 4 mg (has no administration in time range)    Or  ondansetron (ZOFRAN) injection 4 mg (has no administration in time range)  polyethylene glycol (MIRALAX / GLYCOLAX) packet 17 g (has no administration in time range)  furosemide (LASIX) injection 20 mg (20 mg Intravenous Given 05/02/23 1534)  ipratropium-albuterol (DUONEB) 0.5-2.5 (3) MG/3ML nebulizer solution 3 mL (3 mLs Nebulization Given 05/02/23 1529)  methylPREDNISolone sodium succinate (SOLU-MEDROL) 125 mg/2 mL injection 125 mg (125 mg Intravenous Given 05/02/23 1533)  furosemide (LASIX) injection 40 mg (40 mg Intravenous Given 05/02/23 1739)    Mobility walks with person assist     Focused Assessments Neuro Assessment Handoff:  Swallow screen pass? Yes  Cardiac Rhythm: Normal sinus rhythm       Neuro Assessment:   Neuro Checks:      Has TPA been given? No If patient is a Neuro Trauma and patient is going to OR before floor call report to 4N Charge nurse: [phone] or [phone]   R Recommendations: See Admitting Provider Note  Report given to:   Additional Notes:

## 2023-05-02 NOTE — Plan of Care (Signed)
   Problem: Education: Goal: Knowledge of General Education information will improve Description: Including pain rating scale, medication(s)/side effects and non-pharmacologic comfort measures Outcome: Progressing   Problem: Health Behavior/Discharge Planning: Goal: Ability to manage health-related needs will improve Outcome: Progressing   Problem: Clinical Measurements: Goal: Ability to maintain clinical measurements within normal limits will improve Outcome: Progressing Goal: Will remain free from infection Outcome: Progressing Goal: Diagnostic test results will improve Outcome: Progressing Goal: Respiratory complications will improve Outcome: Progressing   Problem: Nutrition: Goal: Adequate nutrition will be maintained Outcome: Progressing   Problem: Coping: Goal: Level of anxiety will decrease Outcome: Progressing

## 2023-05-02 NOTE — Plan of Care (Signed)
  Problem: Education: Goal: Knowledge of General Education information will improve Description: Including pain rating scale, medication(s)/side effects and non-pharmacologic comfort measures Outcome: Progressing   Problem: Health Behavior/Discharge Planning: Goal: Ability to manage health-related needs will improve Outcome: Progressing   Problem: Clinical Measurements: Goal: Ability to maintain clinical measurements within normal limits will improve Outcome: Progressing Goal: Will remain free from infection Outcome: Progressing Goal: Diagnostic test results will improve Outcome: Progressing Goal: Respiratory complications will improve Outcome: Progressing Goal: Cardiovascular complication will be avoided Outcome: Progressing   Problem: Activity: Goal: Risk for activity intolerance will decrease Outcome: Progressing   Problem: Nutrition: Goal: Adequate nutrition will be maintained 05/02/2023 2218 by Nicole Cella, RN Outcome: Progressing 05/02/2023 2217 by Nicole Cella, RN Outcome: Progressing   Problem: Coping: Goal: Level of anxiety will decrease 05/02/2023 2218 by Nicole Cella, RN Outcome: Progressing 05/02/2023 2217 by Nicole Cella, RN Outcome: Progressing   Problem: Pain Managment: Goal: General experience of comfort will improve and/or be controlled Outcome: Progressing

## 2023-05-02 NOTE — ED Notes (Signed)
Pt sitting on side of bed eating dinner.

## 2023-05-02 NOTE — Assessment & Plan Note (Signed)
Stable. -Monitor with diuresis.

## 2023-05-02 NOTE — Assessment & Plan Note (Addendum)
Ongoing tobacco use, reports he has cut down from a pack a day to < than a third of a pack a day. -Patient counseled to quit smoking

## 2023-05-02 NOTE — Assessment & Plan Note (Addendum)
Bilateral pitting lower extremity edema, chest x-ray with new moderate left pleural effusion, BNP elevated at greater than 4500.  Reports compliance with Lasix 20 mg daily.  Not hypoxic.  History of severe aortic stenosis.  - Cardiology consulted, will see in a.m. 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  Recommend inpatient palliative consultation. -IV Lasix 40 twice daily - Check COVID, flu

## 2023-05-02 NOTE — ED Provider Notes (Signed)
[place] EMERGENCY DEPARTMENT AT [facility] Provider Note   CSN: [id number] Arrival date & time: 05/02/23  1152     History  Chief Complaint  Patient presents with   Shortness of Breath    Tommy Knight is a 78 y.o. male history of severe aortic stenosis, CHF, COPD, bilateral carotid artery stenosis, subclavian artery stenosis, cigarette use presented for fluid in his lungs.  Patient went to his primary care provider's office today and was told to come in as they heard fluid bilaterally.  Patient does not normally wear oxygen but states he has been short of breath with lying down the past few nights.  Patient does endorse bilateral leg swelling.  Patient is on 20 mg of Lasix daily and has been taking this without issue.  Patient denies productive cough.  Patient denies fevers or chest pain.  Last echo 11/29/2022 LVEF 25 to 30%, severe aortic stenosis  Home Medications Prior to Admission medications   Medication Sig Start Date End Date Taking? Authorizing Provider  albuterol (PROVENTIL HFA;VENTOLIN HFA) 108 (90 Base) MCG/ACT inhaler Inhale 2 puffs into the lungs every 4 (four) hours as needed for wheezing or shortness of breath.     [provider]  aspirin EC 81 MG tablet Take 1 tablet (81 mg total) by mouth at bedtime. Swallow whole. 12/03/22   Azucena Fallen, MD  atorvastatin (LIPITOR) 40 MG tablet Take 40 mg by mouth every evening.    [provider]  Budeson-Glycopyrrol-Formoterol (BREZTRI AEROSPHERE) 160-9-4.8 MCG/ACT AERO Take 2 puffs first thing in am and then another 2 puffs about 12 hours later. 01/31/23   Nyoka Cowden, MD  clopidogrel (PLAVIX) 75 MG tablet Take 1 tablet (75 mg total) by mouth daily. 08/23/17   Enedina Finner, MD  famotidine (PEPCID) 20 MG tablet One after supper 01/31/23   Nyoka Cowden, MD  fluticasone [facility]) 50 MCG/ACT nasal spray Place into both nostrils as needed for allergies or rhinitis.    [provider]   furosemide (LASIX) 20 MG tablet Take 1 tablet (20 mg total) by mouth daily. 02/08/23 02/08/24  Mallipeddi, Vishnu P, MD  ipratropium-albuterol (DUONEB) 0.5-2.5 (3) MG/3ML SOLN Take 3 mLs by nebulization every 6 (six) hours as needed (for shortness of breath).    [provider]  levocetirizine (XYZAL) 5 MG tablet Take 5 mg by mouth every evening.    [provider]  montelukast (SINGULAIR) 10 MG tablet Take 10 mg by mouth every evening.    [provider]  Multiple Vitamins-Minerals (CENTRUM SILVER 50+MEN) TABS Take 1 tablet by mouth daily.    [provider]  Multiple Vitamins-Minerals (PRESERVISION AREDS 2) CAPS Take 1 capsule by mouth 2 (two) times daily.    [provider]  nicotine (NICODERM CQ - DOSED IN MG/24 HOURS) 21 mg/24hr patch Place 1 patch (21 mg total) onto the skin daily. 12/04/22   Azucena Fallen, MD  omeprazole (PRILOSEC) 20 MG capsule Take 20 mg by mouth daily.    [provider]      Allergies    Patient has no known allergies.    Review of Systems   Review of Systems  Respiratory:  Positive for shortness of breath.     Physical Exam Updated Vital Signs BP (!) 124/100   Pulse 91   Temp 98 F (36.7 C) (Oral)   Resp (!) 26   Ht 5\' 11"  (1.803 m)   Wt 87 kg  SpO2 97%   BMI 26.75 kg/m  Physical Exam Constitutional:      General: He is not in acute distress. Cardiovascular:     Rate and Rhythm: Normal rate and regular rhythm.     Pulses: Normal pulses.     Heart sounds: Murmur heard.  Pulmonary:     Effort: No respiratory distress.     Comments: Able to speak in full sentences Increased work of breathing Orthopneic Nonproductive cough Bilateral wheezing noted with mild rhonchi on the left side Musculoskeletal:     Right lower leg: Edema present.     Left lower leg: Edema present.     Comments: 2+ bilateral pitting edema with bilateral pedal edema  Skin:    General: Skin is warm and dry.      Capillary Refill: Capillary refill takes less than 2 seconds.  Neurological:     Mental Status: He is alert and oriented to person, place, and time.  Psychiatric:        Mood and Affect: Mood normal.     ED Results / Procedures / Treatments   Labs (all labs ordered are listed, but only abnormal results are displayed) Labs Reviewed  CBC WITH DIFFERENTIAL/PLATELET - Abnormal; Notable for the following components:      Result Value   WBC 10.8 (*)    Neutro Abs 9.0 (*)    All other components within normal limits  BASIC METABOLIC PANEL - Abnormal; Notable for the following components:   Glucose, Bld 109 (*)    BUN 29 (*)    Creatinine, Ser 1.63 (*)    Calcium 8.8 (*)    GFR, Estimated 43 (*)    All other components within normal limits  BRAIN NATRIURETIC PEPTIDE - Abnormal; Notable for the following components:   B Natriuretic Peptide >4,500.0 (*)    All other components within normal limits  TROPONIN I (HIGH SENSITIVITY)    EKG None  Radiology DG Chest 2 View Result Date: 05/02/2023 CLINICAL DATA:  Shortness of breath. EXAM: CHEST - 2 VIEW COMPARISON:  CT angiography chest from 12/15/2022. FINDINGS: There is new moderate left pleural effusion with probable underlying compressive atelectatic changes and/or consolidation. There are increased interstitial markings throughout bilateral lungs, which is similar to the prior study. No pulmonary edema. Right lung and right lateral costophrenic angle are clear. Evaluation of cardiomediastinal silhouette is nondiagnostic due to left lower hemithorax opacification. No acute osseous abnormalities. The soft tissues are within normal limits. IMPRESSION: *New moderate left pleural effusion. Electronically Signed   By: Jules Schick M.D.   On: 05/02/2023 15:13    Procedures .Critical Care  Performed by: Netta Corrigan, PA-C Authorized by: Netta Corrigan, PA-C   Critical care provider statement:    Critical care time (minutes):  30    Critical care time was exclusive of:  Separately billable procedures and treating other patients   Critical care was necessary to treat or prevent imminent or life-threatening deterioration of the following conditions:  Cardiac failure   Critical care was time spent personally by me on the following activities:  Blood draw for specimens, development of treatment plan with patient or surrogate, discussions with consultants, evaluation of patient's response to treatment, obtaining history from patient or surrogate, examination of patient, review of old charts, re-evaluation of patient's condition, ordering and review of radiographic studies, pulse oximetry, ordering and review of laboratory studies and ordering and performing treatments and interventions   I assumed direction of critical care for  this patient from another provider in my specialty: no     Care discussed with: admitting provider        EMERGENCY DEPARTMENT [place] CARDIAC EXAM "Study: Limited Ultrasound of the Heart and Pericardium"  INDICATIONS:Dyspnea Multiple views of the heart and pericardium were obtained in real-time with a multi-frequency probe.  PERFORMED JX:BJYNWG IMAGES ARCHIVED?: Yes LIMITATIONS:  Body habitus VIEWS USED: Subcostal 4 chamber, Parasternal long axis, Parasternal short axis, Apical 4 chamber , and Inferior Vena Cava INTERPRETATION: Cardiac activity present, Decreased contractility, and IVC dilated   EMERGENCY DEPARTMENT [place] LUNG EXAM "Study: Limited Ultrasound of the Lung and Thorax"  INDICATIONS: Dyspnea Multiple views of both lungs using sagittal orientation were obtained.  PERFORMED BY: Myself IMAGES ARCHIVED?: Yes LIMITATIONS: Body habitus VIEWS USED: Anterior lung fields INTERPRETATION: Pulmonary edema       Medications Ordered in ED Medications  albuterol (VENTOLIN HFA) 108 (90 Base) MCG/ACT inhaler 2 puff (2 puffs Inhalation Given 05/02/23 1205)  furosemide (LASIX) injection 40 mg (has no  administration in time range)  furosemide (LASIX) injection 20 mg (20 mg Intravenous Given 05/02/23 1534)  ipratropium-albuterol (DUONEB) 0.5-2.5 (3) MG/3ML nebulizer solution 3 mL (3 mLs Nebulization Given 05/02/23 1529)  methylPREDNISolone sodium succinate (SOLU-MEDROL) 125 mg/2 mL injection 125 mg (125 mg Intravenous Given 05/02/23 1533)    ED Course/ Medical Decision Making/ A&P                                 Medical Decision Making Amount and/or Complexity of Data Reviewed Labs: ordered. Radiology: ordered. ECG/medicine tests: ordered.  Risk Prescription drug management. Decision regarding hospitalization.   HARSHAAN WHANG 78 y.o. presented today for shortness of breath.  Working DDx that I considered at this time includes, but not limited to, asthma/COPD exacerbation, URI, viral illness, anemia, ACS, PE, pneumonia, pleural effusion, lung cancer, CHF, respiratory distress, medication side effect, intoxication.  R/o DDx: Asthma exacerbation, URI, viral illness, anemia, ACS, PE, pneumonia, pleural effusion, lung cancer, respiratory distress, medication side effect, intoxication: These are considered less likely due to history of present illness, physical exam, labs/imaging findings  Review of prior external notes: 03/14/2023 office visit  Unique Tests and My Independent Interpretation:  CBC: Mild leukocytosis 10.8 BMP: Around baseline EKG: Sinus 91 bpm, LVH present, no signs of ST elevation or depressions that are new, no signs of ischemia or right heart strain Troponin: pending BNP: Greater than 4500 CXR: New left pleural effusion  Social Determinants of Health: none  Discussion with Independent Historian:  Wife  Discussion of Management of Tests:  Branch, MD Cardiology; Emokpae, MD Hospitalist  Risk: High: hospitalization or escalation of hospital-level care  Risk Stratification Score: none  Plan: On exam patient was no acute distress with stable vitals. Physical  exam showed bilateral wheezing along with increased work of breathing and mild rhonchi on the left side along with bilateral lower leg edema and pedal edema. The cardiac monitor was ordered secondary to the patient's history of shortness of breath and to monitor the patient for dysrhythmia. Cardiac monitor by my independent interpretation showed normal sinus.  Labs from triage including imaging do show elevated BNP along with pleural effusion in the left side which is consistent with his presentation of fluid overload.  Will give patient Lasix along with steroids and breathing treatment as he does have wheezing and so do suspect there is CHF exacerbation along with COPD.  Bedside echo  also shows plethoric IVC and decreased cardiac contractility along with B-lines and anechoic fluid on the left side suggesting pleural effusion and being fluid overloaded.  Will consult cardiology to consult and hospitalist for admission.  I spoke to allergy and they we will see the patient in consultation.  Spoke to hospitalist and patient is excepted for admission.  At this time patient stable to be admitted.  This chart was dictated using voice recognition software.  Despite best efforts to proofread,  errors can occur which can change the documentation meaning.         Final Clinical Impression(s) / ED Diagnoses Final diagnoses:  Acute on chronic congestive heart failure, unspecified heart failure type (HCC)  COPD exacerbation (HCC)  Pleural effusion on left    Rx / DC Orders ED Discharge Orders     None         Remi Deter 05/02/23 1641    Bethann Berkshire, MD 05/04/23 [phone]

## 2023-05-02 NOTE — H&P (Signed)
History and Physical    Tommy Knight HYQ:[id number] DOB: December 17, 1945 DOA: 05/02/2023  PCP: Erasmo Downer, NP   Patient coming from: Home  I have personally briefly reviewed patient's old medical records in [facility]  Chief Complaint: Difficulty breathing  HPI: Tommy Knight is a 78 y.o. male with medical history significant for systolic CHF, COPD, CVA, hypertension, aortic stenosis, coronary artery disease. Patient presented to the ED with complaints of difficulty breathing with bilateral lower extremity swelling over the past 2 weeks.  He reports chronic unchanged cough, and feels like his ribs are fractured from the severity of his cough.  He also reports sneezing. He reports he checks his weight regularly but his weight is unchanged despite his lower extremity swelling.  He reports compliance with his Lasix.  ED Course: Temperature 98.  Heart rate 90s.  Respiratory rate 22-3.  O2 sats 90 to 97% on room air.  For systolic blood pressure 120s to 130s. BNP greater than 4500.  Chest x-ray with new moderate left pleural effusion. IV Lasix 20 mg and then 40 mg given.  Solu-Medrol 125 mg given and DuoNebs given. Cardiology consulted, will see in a.m. for full consultation, diuresis to help symptoms, recommend inpatient palliative consultation.  Review of Systems: As per HPI all other systems reviewed and negative.  Past Medical History:  Diagnosis Date   Allergic rhinitis    Asthma    Carotid artery disease (HCC)    COPD (chronic obstructive pulmonary disease) (HCC)    Hyperlipidemia    Hypertension    Stenosis of left subclavian artery (HCC)    Stroke [facility])     Past Surgical History:  Procedure Laterality Date   APPENDECTOMY     RIGHT/LEFT HEART CATH AND CORONARY ANGIOGRAPHY N/A 12/02/2022   Procedure: RIGHT/LEFT HEART CATH AND CORONARY ANGIOGRAPHY;  Surgeon: Orbie Pyo, MD;  [facility];  Service: Cardiovascular;  Laterality: N/A;     reports  that he has been smoking. He has never used smokeless tobacco. He reports current alcohol use. He reports that he does not use drugs.  No Known Allergies  Family History  Problem Relation Age of Onset   Heart disease Other        multiple family members he's not sure   Prior to Admission medications   Medication Sig Start Date End Date Taking? Authorizing Provider  albuterol (PROVENTIL HFA;VENTOLIN HFA) 108 (90 Base) MCG/ACT inhaler Inhale 2 puffs into the lungs every 4 (four) hours as needed for wheezing or shortness of breath.     [provider]  aspirin EC 81 MG tablet Take 1 tablet (81 mg total) by mouth at bedtime. Swallow whole. 12/03/22   Azucena Fallen, MD  atorvastatin (LIPITOR) 40 MG tablet Take 40 mg by mouth every evening.    [provider]  Budeson-Glycopyrrol-Formoterol (BREZTRI AEROSPHERE) 160-9-4.8 MCG/ACT AERO Take 2 puffs first thing in am and then another 2 puffs about 12 hours later. 01/31/23   Nyoka Cowden, MD  clopidogrel (PLAVIX) 75 MG tablet Take 1 tablet (75 mg total) by mouth daily. 08/23/17   Enedina Finner, MD  famotidine (PEPCID) 20 MG tablet One after supper 01/31/23   Nyoka Cowden, MD  fluticasone [facility]) 50 MCG/ACT nasal spray Place into both nostrils as needed for allergies or rhinitis.    [provider]  furosemide (LASIX) 20 MG tablet Take 1 tablet (20 mg total) by mouth daily. 02/08/23 02/08/24  Mallipeddi,  Vishnu P, MD  ipratropium-albuterol (DUONEB) 0.5-2.5 (3) MG/3ML SOLN Take 3 mLs by nebulization every 6 (six) hours as needed (for shortness of breath).    [provider]  levocetirizine (XYZAL) 5 MG tablet Take 5 mg by mouth every evening.    [provider]  montelukast (SINGULAIR) 10 MG tablet Take 10 mg by mouth every evening.    [provider]  Multiple Vitamins-Minerals (CENTRUM SILVER 50+MEN) TABS Take 1 tablet by mouth daily.    [provider]  Multiple Vitamins-Minerals  (PRESERVISION AREDS 2) CAPS Take 1 capsule by mouth 2 (two) times daily.    [provider]  nicotine (NICODERM CQ - DOSED IN MG/24 HOURS) 21 mg/24hr patch Place 1 patch (21 mg total) onto the skin daily. 12/04/22   Azucena Fallen, MD  omeprazole (PRILOSEC) 20 MG capsule Take 20 mg by mouth daily.    [provider]    Physical Exam: Vitals:   05/02/23 1200 05/02/23 1200 05/02/23 1529 05/02/23 1545  BP:  133/88  (!) 124/100  Pulse:  96  91  Resp:  (!) 22  (!) 26  Temp:  98 F (36.7 C)    TempSrc:  Oral    SpO2:  96% 97% 97%  Weight: 87 kg     Height: 5\' 11"  (1.803 m)       Constitutional: NAD, calm, comfortable Vitals:   05/02/23 1200 05/02/23 1200 05/02/23 1529 05/02/23 1545  BP:  133/88  (!) 124/100  Pulse:  96  91  Resp:  (!) 22  (!) 26  Temp:  98 F (36.7 C)    TempSrc:  Oral    SpO2:  96% 97% 97%  Weight: 87 kg     Height: 5\' 11"  (1.803 m)      Eyes: PERRL, lids and conjunctivae normal ENMT: Mucous membranes are moist.  Neck: normal, supple, no masses, no thyromegaly Respiratory: clear to auscultation bilaterally, no wheezing, no crackles. Normal respiratory effort. No accessory muscle use.  Cardiovascular: Regular rate and rhythm,   1+ pitting bilateral lower extremity edema, worse on the left, with pitting to upper legs on the left, Extremities warm. Abdomen: no tenderness, no masses palpated. No hepatosplenomegaly. Bowel sounds positive.  Musculoskeletal: no clubbing / cyanosis. No joint deformity upper and lower extremities.  Skin: no rashes, lesions, ulcers. No induration Neurologic: No facial symmetry, moving extremities spontaneously, speech fluent. Psychiatric: Normal judgment and insight. Alert and oriented x 3. Normal mood.   Labs on Admission: I have personally reviewed following labs and imaging studies  CBC: Recent Labs  Lab 05/02/23 1229  WBC 10.8*  NEUTROABS 9.0*  HGB 13.4  HCT 42.0  MCV 85.7  PLT 324   Basic Metabolic  Panel: Recent Labs  Lab 05/02/23 1229  NA 138  K 4.4  CL 101  CO2 23  GLUCOSE 109*  BUN 29*  CREATININE 1.63*  CALCIUM 8.8*   Radiological Exams on Admission: DG Chest 2 View Result Date: 05/02/2023 CLINICAL DATA:  Shortness of breath. EXAM: CHEST - 2 VIEW COMPARISON:  CT angiography chest from 12/15/2022. FINDINGS: There is new moderate left pleural effusion with probable underlying compressive atelectatic changes and/or consolidation. There are increased interstitial markings throughout bilateral lungs, which is similar to the prior study. No pulmonary edema. Right lung and right lateral costophrenic angle are clear. Evaluation of cardiomediastinal silhouette is nondiagnostic due to left lower hemithorax opacification. No acute osseous abnormalities. The soft tissues are within normal limits. IMPRESSION: *  New moderate left pleural effusion. Electronically Signed   By: Jules Schick M.D.   On: 05/02/2023 15:13    EKG: Independently reviewed.  Sinus rhythm, rate 91, QTc 466.  No significant change from prior.  Assessment/Plan Principal Problem:   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) heart failure (HCC) Active Problems:   Tobacco use disorder   Essential hypertension   COPD (chronic obstructive pulmonary disease) (HCC)   Severe aortic stenosis   BPH (benign prostatic hyperplasia)   CKD (chronic kidney disease) stage 3, GFR 30-59 ml/min (HCC)  Assessment and Plan: * 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) heart failure (HCC) Bilateral pitting lower extremity edema, chest x-ray with new moderate left pleural effusion, BNP elevated at greater than 4500.  Reports compliance with Lasix 20 mg daily.  Not hypoxic.  History of severe aortic stenosis.  - Cardiology consulted, will see in a.m. 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  Recommend inpatient palliative  consultation. -IV Lasix 40 twice daily - Check COVID, flu  CKD (chronic kidney disease) stage 3, GFR 30-59 ml/min (HCC) CKD stage IIIa/B.  Cr-1.6, about baseline.  Severe aortic stenosis Per cardiology consult- not much to offer with severe AS,not a TAVR candidate with HFrEF LVEF 25 to 30% (Per last Echo- 11/2022).  Recommend inpatient palliative consultation.  Dr.Mallipeddi's note, patient also with severe upper and lower extremity peripheral artery disease, so not deemed a candidate for TAVR based on consensus from multidisciplinary team meeting.   COPD (chronic obstructive pulmonary disease) (HCC) Stable.  Reports chronic cough.  Not hypoxic. Sats- > 96% on room air. -125 mg given in ED, hold off on further steroids -Resume home regimen -Mucolytics  Essential hypertension Stable.  -Monitor with diuresis   Tobacco use disorder Ongoing tobacco use, reports he has cut down from a pack a day to < than a third of a pack a day. -Patient counseled to quit smoking   DVT prophylaxis: Lovenox Code Status: FULL code Family Communication: None at bedside Disposition Plan: ~/> 2 days Consults called:  None Admission status: Inpt tele  I certify that at the point of admission it is my clinical judgment that the patient will require inpatient hospital care spanning beyond 2 midnights from the point of admission due to high intensity of service, high risk for further deterioration and high frequency of surveillance required.   Author: Onnie Boer, MD 05/02/2023 7:54 PM  For on call review [email or web address].

## 2023-05-02 NOTE — Assessment & Plan Note (Signed)
CKD stage IIIa/B.  Cr-1.6, about baseline.

## 2023-05-02 NOTE — ED Notes (Addendum)
ED TO INPATIENT HANDOFF REPORT  ED Nurse Name and Phone #: Jeanella Anton [phone]  S Name/Age/Gender Tommy Knight 78 y.o. male Room/Bed: APA17/APA17  Code Status   Code Status: Full Code  Home/SNF/Other Home Patient oriented to: self, place, time, and situation Is this baseline? Yes   Triage Complete: Triage complete  Chief Complaint 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) heart failure (HCC) [I50.23]  Triage Note Pt arrived via POV c/o bilateral lower extremity swelling X 2 weeks, recent PNA infection and reports being seen by his PCP and was told he has fluid in his lungs. Pt also reports cracked ribs on his right side due to excessive coughing and sneezing.    Allergies No Known Allergies  Level of Care/Admitting Diagnosis ED Disposition     ED Disposition  Admit   Condition  --   Comment  Hospital Area: [facility] [[id number]]  Level of Care: Telemetry [5]  Covid Evaluation: Asymptomatic - no recent exposure (last 10 days) testing not required  Diagnosis: 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) heart failure [facility]) [[id number]]  Admitting Physician: Cresenciano Lick  Attending Physician: Onnie Boer [phone]  Certification:: I certify this patient will need inpatient services for at least 2 midnights  Expected Medical Readiness: 05/04/2023          B Medical/Surgery History Past Medical History:  Diagnosis Date   Allergic rhinitis    Asthma    Carotid artery disease (HCC)    COPD (chronic obstructive pulmonary disease) (HCC)    Hyperlipidemia    Hypertension    Stenosis of left subclavian artery (HCC)    Stroke [facility])    Past Surgical History:  Procedure Laterality Date   APPENDECTOMY     RIGHT/LEFT HEART CATH AND CORONARY ANGIOGRAPHY N/A 12/02/2022   Procedure: RIGHT/LEFT HEART CATH AND CORONARY ANGIOGRAPHY;  Surgeon: Orbie Pyo, MD;  Location: [facility] IN[facility] CV LAB;  Service: Cardiovascular;  Laterality: N/A;     A IV  Location/Drains/Wounds Patient Lines/Drains/Airways Status     Active Line/Drains/Airways     Name Placement date Placement time Site Days   Peripheral IV 05/02/23 20 G Anterior;Proximal;Right Forearm 05/02/23  1530  Forearm  less than 1            Intake/Output Last 24 hours  Intake/Output Summary (Last 24 hours) at 05/02/2023 2054 Last data filed at 05/02/2023 1908 Gross per 24 hour  Intake --  Output 1000 ml  Net -1000 ml    Labs/Imaging Results for orders placed or performed during the hospital encounter of 05/02/23 (from the past 48 hours)  CBC with Differential     Status: Abnormal   Collection Time: 05/02/23 12:29 PM  Result Value Ref Range   WBC 10.8 (H) 4.0 - 10.5 K/uL   RBC 4.90 4.22 - 5.81 MIL/uL   Hemoglobin 13.4 13.0 - 17.0 g/dL   HCT 08.6 57.8 - 46.9 %   MCV 85.7 80.0 - 100.0 fL   MCH 27.3 26.0 - 34.0 pg   MCHC 31.9 30.0 - 36.0 g/dL   RDW 62.9 52.8 - 41.3 %   Platelets 324 150 - 400 K/uL   nRBC 0.0 0.0 - 0.2 %   Neutrophils Relative % 84 %   Neutro Abs 9.0 (H) 1.7 - 7.7 K/uL   Lymphocytes Relative 8 %   Lymphs Abs 0.8 0.7 - 4.0 K/uL   Monocytes Relative 7 %   Monocytes Absolute 0.8 0.1 - 1.0 K/uL   Eosinophils Relative  1 %   Eosinophils Absolute 0.1 0.0 - 0.5 K/uL   Basophils Relative 0 %   Basophils Absolute 0.0 0.0 - 0.1 K/uL   Immature Granulocytes 0 %   Abs Immature Granulocytes 0.03 0.00 - 0.07 K/uL    Comment: Performed at [facility], [address], [place], [place] [address]  Basic metabolic panel     Status: Abnormal   Collection Time: 05/02/23 12:29 PM  Result Value Ref Range   Sodium 138 135 - 145 mmol/L   Potassium 4.4 3.5 - 5.1 mmol/L   Chloride 101 98 - 111 mmol/L   CO2 23 22 - 32 mmol/L   Glucose, Bld 109 (H) 70 - 99 mg/dL    Comment: Glucose reference range applies only to samples taken after fasting for at least 8 hours.   BUN 29 (H) 8 - 23 mg/dL   Creatinine, Ser 0.98 (H) 0.61 - 1.24 mg/dL   Calcium 8.8 (L) 8.9 - 10.3 mg/dL    GFR, Estimated 43 (L) >60 mL/min    Comment: (NOTE) Calculated using the CKD-EPI Creatinine Equation (2021)    Anion gap 14 5 - 15    Comment: Performed at [facility], [address], [place], [place] [address]  Brain natriuretic peptide     Status: Abnormal   Collection Time: 05/02/23 12:29 PM  Result Value Ref Range   B Natriuretic Peptide >4,500.0 (H) 0.0 - 100.0 pg/mL    Comment: Performed at [facility], [address] [place], [place] [address]  Troponin I (High Sensitivity)     Status: Abnormal   Collection Time: 05/02/23  3:33 PM  Result Value Ref Range   Troponin I (High Sensitivity) 53 (H) <18 ng/L    Comment: (NOTE) Elevated high sensitivity troponin I (hsTnI) values and significant  changes across serial measurements may suggest ACS but many other  chronic and acute conditions are known to elevate hsTnI results.  Refer to the "Links" section for chest pain algorithms and additional  guidance. Performed at [facility], [address] [place], [place] [address]   Troponin I (High Sensitivity)     Status: Abnormal   Collection Time: 05/02/23  5:07 PM  Result Value Ref Range   Troponin I (High Sensitivity) 52 (H) <18 ng/L    Comment: (NOTE) Elevated high sensitivity troponin I (hsTnI) values and significant  changes across serial measurements may suggest ACS but many other  chronic and acute conditions are known to elevate hsTnI results.  Refer to the "Links" section for chest pain algorithms and additional  guidance. Performed at [facility], [address], [place], [place] [address]    DG Chest 2 View Result Date: 05/02/2023 CLINICAL DATA:  Shortness of breath. EXAM: CHEST - 2 VIEW COMPARISON:  CT angiography chest from 12/15/2022. FINDINGS: There is new moderate left pleural effusion with probable underlying compressive atelectatic changes and/or consolidation. There are increased interstitial markings throughout bilateral lungs, which is similar to the prior  study. No pulmonary edema. Right lung and right lateral costophrenic angle are clear. Evaluation of cardiomediastinal silhouette is nondiagnostic due to left lower hemithorax opacification. No acute osseous abnormalities. The soft tissues are within normal limits. IMPRESSION: *New moderate left pleural effusion. Electronically Signed   By: Jules Schick M.D.   On: 05/02/2023 15:13    Pending Labs Unresulted Labs (From admission, onward)     Start     Ordered   05/03/23 0500  Basic metabolic panel  Daily,   R  05/02/23 1953   05/02/23 1928  Resp panel by RT-PCR (RSV, Flu A&B, Covid) Anterior Nasal Swab  (Resp Panel by RT-PCR (RSV, Flu A&B, Covid) with precautions)  Once,   R        05/02/23 1928            Vitals/Pain Today's Vitals   05/02/23 1545 05/02/23 1600 05/02/23 1615 05/02/23 1745  BP: (!) 124/100 (!) 122/97 130/88 (!) 127/92  Pulse: 91 91 90 93  Resp: (!) 26 (!) 33 (!) 26 (!) 28  Temp:   98.1 F (36.7 C)   TempSrc:   Oral   SpO2: 97% 95% 94% 90%  Weight:      Height:      PainSc:   0-No pain     Isolation Precautions No active isolations  Medications Medications  albuterol (VENTOLIN HFA) 108 (90 Base) MCG/ACT inhaler 2 puff (2 puffs Inhalation Given 05/02/23 1205)  guaiFENesin-dextromethorphan (ROBITUSSIN DM) 100-10 MG/5ML syrup 15 mL (has no administration in time range)  aspirin EC tablet 81 mg (has no administration in time range)  atorvastatin (LIPITOR) tablet 40 mg (has no administration in time range)  mometasone-formoterol (DULERA) 200-5 MCG/ACT inhaler 2 puff (has no administration in time range)  ipratropium-albuterol (DUONEB) 0.5-2.5 (3) MG/3ML nebulizer solution 3 mL (has no administration in time range)  furosemide (LASIX) injection 40 mg (has no administration in time range)  enoxaparin (LOVENOX) injection 40 mg (has no administration in time range)  acetaminophen (TYLENOL) tablet 650 mg (has no administration in time range)    Or   acetaminophen (TYLENOL) suppository 650 mg (has no administration in time range)  ondansetron (ZOFRAN) tablet 4 mg (has no administration in time range)    Or  ondansetron (ZOFRAN) injection 4 mg (has no administration in time range)  polyethylene glycol (MIRALAX / GLYCOLAX) packet 17 g (has no administration in time range)  furosemide (LASIX) injection 20 mg (20 mg Intravenous Given 05/02/23 1534)  ipratropium-albuterol (DUONEB) 0.5-2.5 (3) MG/3ML nebulizer solution 3 mL (3 mLs Nebulization Given 05/02/23 1529)  methylPREDNISolone sodium succinate (SOLU-MEDROL) 125 mg/2 mL injection 125 mg (125 mg Intravenous Given 05/02/23 1533)  furosemide (LASIX) injection 40 mg (40 mg Intravenous Given 05/02/23 1739)    Mobility walks with person assist     Focused Assessments Renal Assessment Handoff:  Hemodialysis Schedule:  Last Hemodialysis date and time:    Restricted appendage:     R Recommendations: See Admitting Provider Note  Report given to:   Additional Notes:  Uses urinal at bedside, alert and oriented.Given lasix. Room air.

## 2023-05-02 NOTE — Assessment & Plan Note (Addendum)
Stable.  Reports chronic cough.  Not hypoxic. Sats- > 96% on room air. -125 mg given in ED, hold off on further steroids -Resume home regimen -Mucolytics

## 2023-05-02 NOTE — ED Triage Notes (Signed)
Pt arrived via POV c/o bilateral lower extremity swelling X 2 weeks, recent PNA infection and reports being seen by his PCP and was told he has fluid in his lungs. Pt also reports cracked ribs on his right side due to excessive coughing and sneezing.

## 2023-05-02 NOTE — Progress Notes (Signed)
Patient discussed with ER staff. Severe AS not a TAVR candidate with HFrEF LVEF 25-30%. Encouraged to see palliative team at 02/08/23 appt with Dr Jenene Slicker as there is nothing that can be done cardiac wise. Presents volume overload, BNP >4500, CXR moderate left effusion.  This admission essentially can diurese to help symptoms and would recommend inpatient palliative consultation. There is not much to offer.  We will see full consultation in the AM, would dose IV lasix 40mg  bid.   Dominga Ferry MD

## 2023-05-03 ENCOUNTER — Encounter (HOSPITAL_COMMUNITY): Payer: Self-pay | Admitting: Internal Medicine

## 2023-05-03 DIAGNOSIS — Z7189 Other specified counseling: Secondary | ICD-10-CM

## 2023-05-03 DIAGNOSIS — Z515 Encounter for palliative care: Secondary | ICD-10-CM

## 2023-05-03 DIAGNOSIS — I5023 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) heart failure: Secondary | ICD-10-CM | POA: Diagnosis not present

## 2023-05-03 LAB — CBC WITH DIFFERENTIAL/PLATELET
Abs Immature Granulocytes: 0.02 10*3/uL (ref 0.00–0.07)
Basophils Absolute: 0 10*3/uL (ref 0.0–0.1)
Basophils Relative: 0 %
Eosinophils Absolute: 0 10*3/uL (ref 0.0–0.5)
Eosinophils Relative: 1 %
HCT: 43.5 % (ref 39.0–52.0)
Hemoglobin: 14 g/dL (ref 13.0–17.0)
Immature Granulocytes: 0 %
Lymphocytes Relative: 8 %
Lymphs Abs: 0.5 10*3/uL — ABNORMAL LOW (ref 0.7–4.0)
MCH: 28.2 pg (ref 26.0–34.0)
MCHC: 32.2 g/dL (ref 30.0–36.0)
MCV: 87.5 fL (ref 80.0–100.0)
Monocytes Absolute: 0.2 10*3/uL (ref 0.1–1.0)
Monocytes Relative: 4 %
Neutro Abs: 5.8 10*3/uL (ref 1.7–7.7)
Neutrophils Relative %: 87 %
Platelets: 346 10*3/uL (ref 150–400)
RBC: 4.97 MIL/uL (ref 4.22–5.81)
RDW: 13.8 % (ref 11.5–15.5)
WBC: 6.7 10*3/uL (ref 4.0–10.5)
nRBC: 0 % (ref 0.0–0.2)

## 2023-05-03 LAB — BASIC METABOLIC PANEL
Anion gap: 13 (ref 5–15)
BUN: 32 mg/dL — ABNORMAL HIGH (ref 8–23)
CO2: 28 mmol/L (ref 22–32)
Calcium: 8.9 mg/dL (ref 8.9–10.3)
Chloride: 97 mmol/L — ABNORMAL LOW (ref 98–111)
Creatinine, Ser: 1.87 mg/dL — ABNORMAL HIGH (ref 0.61–1.24)
GFR, Estimated: 36 mL/min — ABNORMAL LOW (ref >60)
Glucose, Bld: 146 mg/dL — ABNORMAL HIGH (ref 70–99)
Potassium: 4.1 mmol/L (ref 3.5–5.1)
Sodium: 138 mmol/L (ref 135–145)

## 2023-05-03 MED ORDER — ORAL CARE MOUTH RINSE: 15.0000 mL | OROMUCOSAL | Status: DC | PRN

## 2023-05-03 MED ORDER — FUROSEMIDE 10 MG/ML IJ SOLN
40.0000 mg | INTRAMUSCULAR | Status: DC
  Administered 2023-05-03 – 2023-05-04 (×2): 40 mg via INTRAVENOUS
  Filled 2023-05-03 (×2): qty 4

## 2023-05-03 MED ORDER — NICOTINE 21 MG/24HR TD PT24
21.0000 mg | MEDICATED_PATCH | Freq: Every day | TRANSDERMAL | Status: DC
  Administered 2023-05-03 – 2023-05-04 (×2): 21 mg via TRANSDERMAL
  Filled 2023-05-03 (×2): qty 1

## 2023-05-03 MED ORDER — CLOPIDOGREL BISULFATE 75 MG PO TABS
75.0000 mg | ORAL_TABLET | Freq: Every day | ORAL | Status: DC
  Administered 2023-05-03 – 2023-05-04 (×2): 75 mg via ORAL
  Filled 2023-05-03 (×2): qty 1

## 2023-05-03 NOTE — Consult Note (Signed)
Consultation Note Date: 05/03/2023   Patient Name: Tommy Knight  DOB: [birth year]  MRN: [id number]  Age / Sex: 78 y.o., male  PCP: Erasmo Downer, NP Referring Physician: Rhetta Mura, MD  Reason for Consultation: Establishing goals of care  HPI/Patient Profile: 78 y.o. male  with past medical history of systolic CHF, COPD, CVA, hypertension, aortic stenosis, coronary artery disease.  admitted on 05/02/2023 with severe aortic stenosis, no TAVR.   Clinical Assessment and Goals of Care: I have reviewed medical records including EPIC notes, labs and imaging, received report from RN, assessed the patient. Mr. Partington is resting quietly in bed.  He greets me, making and mostly keeping eye contact.  He is alert and oriented, able to make his needs known.  His wife of 58 years, [name], is present at bedside.  We then met at the bedside to discuss diagnosis prognosis, GOC, EOL wishes, disposition and options. I introduced Palliative Medicine as specialized medical care for people living with serious illness. It focuses on providing relief from the symptoms and stress of a serious illness. The goal is to improve quality of life for both the patient and the family.  We discussed a brief life review of the patient.  Mr. and Mrs. Molina have been married for 58 years.  They have 2 children, a daughter and a son.  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  He has been independent with ADLs prior to this.  We then focused on their current illness.  We talk about his heart issues in detail including, but not limited to, heart failure, fluid overload, aortic stenosis.  All questions answered.  At this point Mr. Dondero states that he has a trusted cardiologist he sees regularly.  He states that he would prefer to return to his home after hospital stay.  We  talked about the benefits of outpatient palliative services which she declines at this point.  The natural disease trajectory and expectations at EOL were discussed.  Advanced directives, concepts specific to code status, artifical feeding and hydration, and rehospitalization were considered and discussed.  We talk about the concept of "treat the treatable, but allow a natural passing".  Mr. Mrs. Fuquay readily endorsed DNR.  Orders adjusted.  Discussed the importance of continued conversation with family and the medical providers regarding overall plan of care and treatment options, ensuring decisions are within the context of the patient's values and GOCs.  Questions and concerns were addressed.  The family was encouraged to call with questions or concerns.  PMT will continue to support holistically.  Conference with transition of care team related to patient condition, needs, goals of care, disposition.   HCPOA  NEXT OF KIN -wife of 58 years, [name].    SUMMARY OF RECOMMENDATIONS   Continue to treat the treatable but no CPR or intubation Time for outcomes Home, no anticipated needs Declines outpatient palliative at this time   Code Status/Advance Care Planning: DNR -  We talk about the concept of "treat the treatable, but allow a natural passing".  Mr. Mrs. Fuquay readily endorsed DNR.  Orders adjusted.  Symptom Management:  Per hospitalist, no additional needs at this time.  Palliative Prophylaxis:  Frequent Pain Assessment and Oral Care  Additional Recommendations (Limitations, Scope, Preferences): Continue to treat but no CPR or intubation  Psycho-social/Spiritual:  Desire for further Chaplaincy support:no Additional Recommendations: Caregiving  Support/Resources  Prognosis:  Unable to determine, based on outcomes.  Discharge Planning: Anticipate home with no special needs or services, declines outpatient palliative at this time.      Primary Diagnoses: Present on  Admission:  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) heart failure (HCC)  BPH (benign prostatic hyperplasia)  COPD (chronic obstructive pulmonary disease) (HCC)  Essential hypertension  Tobacco use disorder  CKD (chronic kidney disease) stage 3, GFR 30-59 ml/min (HCC)   I have reviewed the medical record, interviewed the patient and family, and examined the patient. The following aspects are pertinent.  Past Medical History:  Diagnosis Date   Allergic rhinitis    Asthma    Carotid artery disease (HCC)    COPD (chronic obstructive pulmonary disease) (HCC)    Hyperlipidemia    Hypertension    Stenosis of left subclavian artery (HCC)    Stroke [facility])    Social History   Socioeconomic History   Marital status: Married    Spouse name: Not on file   Number of children: Not on file   Years of education: Not on file   Highest education level: Not on file  Occupational History   Not on file  Tobacco Use   Smoking status: Every Day   Smokeless tobacco: Never  Vaping Use   Vaping status: Never Used  Substance and Sexual Activity   Alcohol use: Yes    Comment: 1-3 beers per day   Drug use: Never   Sexual activity: Not on file  Other Topics Concern   Not on file  Social History Narrative   Not on file   Social Drivers of Health   Financial Resource Strain: Not on file  Food Insecurity: No Food Insecurity (05/02/2023)   Hunger Vital Sign    Worried About Running Out of Food in the Last Year: Never true    Ran Out of Food in the Last Year: Never true  Transportation Needs: No Transportation Needs (05/02/2023)   PRAPARE - Administrator, Civil Service (Medical): No    Lack of Transportation (Non-Medical): No  Physical Activity: Not on file  Stress: Not on file  Social Connections: Unknown (05/02/2023)   Social Connection and Isolation Panel [NHANES]    Frequency of Communication with Friends and Family: More than three times a week    Frequency of Social Gatherings  with Friends and Family: Twice a week    Attends Religious Services: Patient declined    Database administrator or Organizations: Patient declined    Attends Banker Meetings: Never    Marital Status: Married   Family History  Problem Relation Age of Onset   Heart disease Other        multiple family members he's not sure   Scheduled Meds:  aspirin EC  81 mg Oral QHS   atorvastatin  40 mg Oral QPM   enoxaparin (LOVENOX) injection  40 mg Subcutaneous Q24H   furosemide  40 mg Intravenous Q12H   guaiFENesin-dextromethorphan  15 mL Oral Q8H   mometasone-formoterol  2 puff  Inhalation BID   Continuous Infusions: PRN Meds:.acetaminophen **OR** acetaminophen, albuterol, ipratropium-albuterol, ondansetron **OR** ondansetron (ZOFRAN) IV, mouth rinse, polyethylene glycol Medications Prior to Admission:  Prior to Admission medications   Medication Sig Start Date End Date Taking? Authorizing Provider  albuterol (PROVENTIL HFA;VENTOLIN HFA) 108 (90 Base) MCG/ACT inhaler Inhale 2 puffs into the lungs every 4 (four) hours as needed for wheezing or shortness of breath.    Yes [provider]  aspirin EC 81 MG tablet Take 1 tablet (81 mg total) by mouth at bedtime. Swallow whole. 12/03/22  Yes Azucena Fallen, MD  fluticasone-salmeterol (ADVAIR) 250-50 MCG/ACT AEPB Inhale 1 puff into the lungs in the morning and at bedtime. 04/13/23  Yes [provider]  ipratropium-albuterol (DUONEB) 0.5-2.5 (3) MG/3ML SOLN Take 3 mLs by nebulization every 6 (six) hours as needed (for shortness of breath).   Yes [provider]  levofloxacin (LEVAQUIN) 500 MG tablet Take 500 mg by mouth daily. 04/13/23  Yes [provider]  atorvastatin (LIPITOR) 40 MG tablet Take 40 mg by mouth every evening.    [provider]  Budeson-Glycopyrrol-Formoterol (BREZTRI AEROSPHERE) 160-9-4.8 MCG/ACT AERO Take 2 puffs first thing in am and then another 2 puffs about 12 hours  later. Patient not taking: Reported on 05/02/2023 01/31/23   Nyoka Cowden, MD  clopidogrel (PLAVIX) 75 MG tablet Take 1 tablet (75 mg total) by mouth daily. 08/23/17   Enedina Finner, MD  famotidine (PEPCID) 20 MG tablet One after supper 01/31/23   Nyoka Cowden, MD  fluticasone [facility]) 50 MCG/ACT nasal spray Place into both nostrils as needed for allergies or rhinitis.    [provider]  furosemide (LASIX) 20 MG tablet Take 1 tablet (20 mg total) by mouth daily. 02/08/23 02/08/24  Mallipeddi, Vishnu P, MD  levocetirizine (XYZAL) 5 MG tablet Take 5 mg by mouth every evening.    [provider]  montelukast (SINGULAIR) 10 MG tablet Take 10 mg by mouth every evening.    [provider]  Multiple Vitamins-Minerals (CENTRUM SILVER 50+MEN) TABS Take 1 tablet by mouth daily.    [provider]  Multiple Vitamins-Minerals (PRESERVISION AREDS 2) CAPS Take 1 capsule by mouth 2 (two) times daily.    [provider]  nicotine (NICODERM CQ - DOSED IN MG/24 HOURS) 21 mg/24hr patch Place 1 patch (21 mg total) onto the skin daily. 12/04/22   Azucena Fallen, MD  omeprazole (PRILOSEC) 20 MG capsule Take 20 mg by mouth daily.    [provider]  predniSONE (DELTASONE) 20 MG tablet Take 20 mg by mouth 2 (two) times daily with a meal. 04/13/23   [provider]   No Known Allergies Review of Systems  Unable to perform ROS: Other    Physical Exam Vitals and nursing note reviewed.  Constitutional:      General: He is not in acute distress.    Appearance: He is not ill-appearing.  Cardiovascular:     Rate and Rhythm: Normal rate.  Pulmonary:     Effort: Pulmonary effort is normal. No tachypnea.  Skin:    General: Skin is warm and dry.  Neurological:     Mental Status: He is alert and oriented to person, place, and time.  Psychiatric:        Mood and Affect: Mood normal.        Behavior: Behavior normal.     Vital Signs: BP 114/88  (BP Location: Right Arm)   Pulse 87  Temp 97.7 F (36.5 C) (Oral)   Resp 20   Ht 5\' 11"  (1.803 m)   Wt 81.6 kg   SpO2 93%   BMI 25.09 kg/m  Pain Scale: 0-10   Pain Score: 0-No pain   SpO2: SpO2: 93 % O2 Device:SpO2: 93 % O2 Flow Rate: .   IO: Intake/output summary:  Intake/Output Summary (Last 24 hours) at 05/03/2023 1610 Last data filed at 05/03/2023 9604 Gross per 24 hour  Intake 480 ml  Output 1000 ml  Net -520 ml    LBM: Last BM Date : 05/01/23 Baseline Weight: Weight: 87 kg Most recent weight: Weight: 81.6 kg     Palliative Assessment/Data:     Time In: 0930    Time Out: 1045 Time Total: 75 minutes  Greater than 50%  of this time was spent counseling and coordinating care related to the above assessment and plan.  Signed by: Katheran Awe, NP   Please contact Palliative Medicine Team phone at [phone] for questions and concerns.  For individual provider: See Loretha Stapler

## 2023-05-03 NOTE — Progress Notes (Signed)
   05/03/23 0800  ReDS Vest / Clip  Station Marker D  Ruler Value 37  ReDS Value Range 36 - 40  ReDS Actual Value 40

## 2023-05-03 NOTE — Consult Note (Signed)
Cardiology Consultation   Patient ID: Tommy Knight MRN: [id number]; DOB: 1945/03/25  Admit date: 05/02/2023 Date of Consult: 05/03/2023  PCP:  Erasmo Downer, NP   Tommy Knight Cardiologist:  Marjo Bicker, MD        Patient Profile:   Tommy Knight is a 78 y.o. male with a hx of severe PAD, mild to mod CAD, severe aortic stenosis not a TAVR candidate  who is being seen 05/03/2023 for the evaluation of SOB at the request of Dr Mahala Menghini.  History of Present Illness:   Tommy Knight 78 yo male history of mild to mod CAD, chronic HFrEF, PAD, severe aortic stenosis not a TAVR candidate due to extensive severe PAD, presents with SOB. Progressing SOB, orthopnea, and LE edema over the last several weeks.   From Dr Antoine Poche notes: "Multidisciplinary imaging review conducted in October 2024 and the consensus was that patient is not a candidate for TAVR given significant PAD and carotid approach was not feasible due to disease at the ostium of the left carotid. Medical management was strongly recommended and palliative care if necessary"   WBC 10.8 Hgb 13.4 Plt 324 K 4.4 Cr 1.63 BNP >4500 Trop 53-->52 EKG  CXR: mod left pleural effusion Echo 11/2022: LVE 25-30%, grade III dd, severe LAE, severe AS mean grad 28, AVA VTI 0.61, DI 0.21  Past Medical History:  Diagnosis Date   Allergic rhinitis    Asthma    Carotid artery disease (HCC)    COPD (chronic obstructive pulmonary disease) (HCC)    Hyperlipidemia    Hypertension    Stenosis of left subclavian artery (HCC)    Stroke [facility])     Past Surgical History:  Procedure Laterality Date   APPENDECTOMY     RIGHT/LEFT HEART CATH AND CORONARY ANGIOGRAPHY N/A 12/02/2022   Procedure: RIGHT/LEFT HEART CATH AND CORONARY ANGIOGRAPHY;  Surgeon: Orbie Pyo, MD;  Location: [facility] INVASIVE CV LAB;  Service: Cardiovascular;  Laterality: N/A;     Inpatient Medications: Scheduled Meds:  aspirin EC  81 mg Oral QHS    atorvastatin  40 mg Oral QPM   enoxaparin (LOVENOX) injection  40 mg Subcutaneous Q24H   furosemide  40 mg Intravenous Q12H   guaiFENesin-dextromethorphan  15 mL Oral Q8H   mometasone-formoterol  2 puff Inhalation BID   Continuous Infusions:  PRN Meds: acetaminophen **OR** acetaminophen, albuterol, ipratropium-albuterol, ondansetron **OR** ondansetron (ZOFRAN) IV, mouth rinse, polyethylene glycol  Allergies:   No Known Allergies  Social History:   Social History   Socioeconomic History   Marital status: Married    Spouse name: Not on file   Number of children: Not on file   Years of education: Not on file   Highest education level: Not on file  Occupational History   Not on file  Tobacco Use   Smoking status: Every Day   Smokeless tobacco: Never  Vaping Use   Vaping status: Never Used  Substance and Sexual Activity   Alcohol use: Yes    Comment: 1-3 beers per day   Drug use: Never   Sexual activity: Not on file  Other Topics Concern   Not on file  Social History Narrative   Not on file   Social Drivers of Health   Financial Resource Strain: Not on file  Food Insecurity: No Food Insecurity (05/02/2023)   Hunger Vital Sign    Worried About Running Out of Food in the Last Year: Never true  Ran Out of Food in the Last Year: Never true  Transportation Needs: No Transportation Needs (05/02/2023)   PRAPARE - Administrator, Civil Service (Medical): No    Lack of Transportation (Non-Medical): No  Physical Activity: Not on file  Stress: Not on file  Social Connections: Unknown (05/02/2023)   Social Connection and Isolation Panel [NHANES]    Frequency of Communication with Friends and Family: More than three times a week    Frequency of Social Gatherings with Friends and Family: Twice a week    Attends Religious Services: Patient declined    Database administrator or Organizations: Patient declined    Attends Banker Meetings: Never    Marital  Status: Married  Catering manager Violence: Not At Risk (05/02/2023)   Humiliation, Afraid, Rape, and Kick questionnaire    Fear of Current or Ex-Partner: No    Emotionally Abused: No    Physically Abused: No    Sexually Abused: No    Family History:    Family History  Problem Relation Age of Onset   Heart disease Other        multiple family members he's not sure     ROS:  Please see the history of present illness.   All other ROS reviewed and negative.     Physical Exam/Data:   Vitals:   05/02/23 2152 05/03/23 0428 05/03/23 0739 05/03/23 0828  BP: 115/76 121/86  114/88  Pulse: 87 86  87  Resp: 20 20    Temp: 98 F (36.7 C) 97.6 F (36.4 C)  97.7 F (36.5 C)  TempSrc: Oral Oral  Oral  SpO2: 93% 93% 91% 93%  Weight: 82.7 kg 81.6 kg    Height: 5\' 11"  (1.803 m)       Intake/Output Summary (Last 24 hours) at 05/03/2023 0955 Last data filed at 05/03/2023 0900 Gross per 24 hour  Intake 720 ml  Output 1000 ml  Net -280 ml      05/03/2023    4:28 AM 05/02/2023    9:52 PM 05/02/2023   12:00 PM  Last 3 Weights  Weight (lbs) 179 lb 14.3 oz 182 lb 5.1 oz 191 lb 12.8 oz  Weight (kg) 81.6 kg 82.7 kg 87 kg     Body mass index is 25.09 kg/m.  General:  Well nourished, well developed, in no acute distress HEENT: normal Neck: +JVD Vascular: No carotid bruits; Distal pulses 2+ bilaterally Cardiac:  RRR, 2/6 systolic murmur rusb.  Lungs:  clear to auscultation bilaterally, no wheezing, rhonchi or rales  Abd: soft, nontender, no hepatomegaly  Ext: 2+ bilateral LE edema Musculoskeletal:  No deformities, BUE and BLE strength normal and equal Skin: warm and dry  Neuro:  CNs 2-12 intact, no focal abnormalities noted Psych:  Normal affect     Laboratory Data:  High Sensitivity Troponin:   Recent Labs  Lab 05/02/23 1533 05/02/23 1707  TROPONINIHS 53* 52*     Chemistry Recent Labs  Lab 05/02/23 1229 05/03/23 0407  NA 138 138  K 4.4 4.1  CL 101 97*  CO2 23 28   GLUCOSE 109* 146*  BUN 29* 32*  CREATININE 1.63* 1.87*  CALCIUM 8.8* 8.9  GFRNONAA 43* 36*  ANIONGAP 14 13    No results for input(s): "PROT", "ALBUMIN", "AST", "ALT", "ALKPHOS", "BILITOT" in the last 168 hours. Lipids No results for input(s): "CHOL", "TRIG", "HDL", "LABVLDL", "LDLCALC", "CHOLHDL" in the last 168 hours.  Hematology Recent Labs  Lab  05/02/23 1229 05/03/23 0407  WBC 10.8* 6.7  RBC 4.90 4.97  HGB 13.4 14.0  HCT 42.0 43.5  MCV 85.7 87.5  MCH 27.3 28.2  MCHC 31.9 32.2  RDW 13.4 13.8  PLT 324 346   Thyroid No results for input(s): "TSH", "FREET4" in the last 168 hours.  BNP Recent Labs  Lab 05/02/23 1229  BNP >4,500.0*    DDimer No results for input(s): "DDIMER" in the last 168 hours.   Radiology/Studies:  DG Chest 2 View Result Date: 05/02/2023 CLINICAL DATA:  Shortness of breath. EXAM: CHEST - 2 VIEW COMPARISON:  CT angiography chest from 12/15/2022. FINDINGS: There is new moderate left pleural effusion with probable underlying compressive atelectatic changes and/or consolidation. There are increased interstitial markings throughout bilateral lungs, which is similar to the prior study. No pulmonary edema. Right lung and right lateral costophrenic angle are clear. Evaluation of cardiomediastinal silhouette is nondiagnostic due to left lower hemithorax opacification. No acute osseous abnormalities. The soft tissues are within normal limits. IMPRESSION: *New moderate left pleural effusion. Electronically Signed   By: Jules Schick M.D.   On: 05/02/2023 15:13     Assessment and Plan:   1.Acute on chronic HFrEF complicated by severe low flow low gradient AS - Echo 11/2022: LVE 25-30%, grade III dd, severe LAE, severe AS mean grad 28, AVA VTI 0.61, DI 0.21  - CXR left effusion, BNP >4500 - received IV lasix total of 100mg  yesterday, due for 40mg  bid dosing today. I/Os are incomplete, uptrend in Cr with diuresis, accept higher Cr for now in order to diurese.  Perhaps some low output leading to Cr rise with diuresis, not a candidate for inotropes given endstage HF/valve disease not a TAVR candidate. Diurese best we can and accept high Cr.  - remains fluid overloaded, continue IV diuretics.  - from notes medical therapy has been limited by hypotension. I think given unfixable valve no real benefit in trying to reintroduce now.     2.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 Discussions with primary cardiologist at last clinic visit to pursue palliative consultation - presents volume overloaded - can diurese best we can for symptoms but overall poor short term prognosis, will need inpatient palliative evaluation      For questions or updates, please contact [facility] Please consult [email or web address] for contact info under    Signed, Dina Rich, MD  05/03/2023 9:55 AM

## 2023-05-03 NOTE — Progress Notes (Addendum)
[facility] ROUNDING  NOTE Tommy Knight WJX:[id number]  DOB: 06/30/1945  DOA: 05/02/2023  PCP: Erasmo Downer, NP  05/03/2023,9:21 AM  LOS: 1 day   Code Status: Full  From:  home   Current Dispo: home   78 year old male Known history of CVA 2019 prior tobacco abuse HLD GERD COPD Diagnosed with new onset heart failure 12/03/2022 admission Patient also severe AoS not TAVR candidate EF 25-30% and was referred by cardiology Dr. Jenene Slicker to palliative care  2/17 presented to ED lower extremity swelling X 2 weeks recent pneumonia BNP >4500 CXR effusion --CT angio vascular findings measurements pertinent to potential TAVR procedure calcifications aortic valve compatible with aortic stenosis, severe aortic iliofemoral atherosclerosis with severe narrowing left main and three-vessel CAD Cardiology aware of patient and chart consulted-recommended IV Lasix 40 twice daily NAD started on Solu-Medrol DuoNebs   Plan  Severe AoS  non--trans caval candidate, non-Transaortic TAVRcandidate 2/2 COPD  EF 25-30%--Home dose 20 mg once daily Left pleural effusion likely transudative from CHF Goal is net negative diuresis and keep dry continue Lasix 40 every 12 Not sure if he was noncompliant with the evening dose-it kept him "awake at night"-will switch to 8 AM evening dose 4 PM Strict I's/O Daily weights Likely cannot tolerate GDMT as preload dependent  Prior CVA 2019 continue aspirin 81, Plavix 75 atorvastatin 40  Prior smoker, COPD Continue albuterol DuoNeb Dulera Singulair No component of COPD this hospital stay-will order NicoDerm patch 21  CKD 3 A baseline creatinine about 1.6 Doubt dialysis candidate have discussed this briefly with him/wife  Goals of care-discussion with family patient at the bedside-understands limitations of medical care-understands may need to decide on palliative versus hospice approach and would want to go home with hospice  DVT prophylaxis: lovenox  Status is:  Inpatient Remains inpatient appropriate because:   Requires diuresis and further management   Subjective: Comfortable sitting up in bed no fever chills Slightly swollen lower extremities no chest pain He does not feel any significant chest pain he has not been lightheaded  Objective + exam Vitals:   05/02/23 2152 05/03/23 0428 05/03/23 0739 05/03/23 0828  BP: 115/76 121/86  114/88  Pulse: 87 86  87  Resp: 20 20    Temp: 98 F (36.7 C) 97.6 F (36.4 C)  97.7 F (36.5 C)  TempSrc: Oral Oral  Oral  SpO2: 93% 93% 91% 93%  Weight: 82.7 kg 81.6 kg    Height: 5\' 11"  (1.803 m)      Filed Weights   05/02/23 1200 05/02/23 2152 05/03/23 0428  Weight: 87 kg 82.7 kg 81.6 kg    Examination: EOMI NCAT thick neck Mallampati 3 Sinus rhythm soft murmur LUSB on monitors seems to be in sinus rhythm with PVC I do not appreciate rales some decreased air entry posteriorly Abdomen is soft no rebound no guarding Pelvis 5/5   Data Reviewed: reviewed   CBC    Component Value Date/Time   WBC 10.8 (H) 05/02/2023 1229   RBC 4.90 05/02/2023 1229   HGB 13.4 05/02/2023 1229   HGB 15.3 04/16/2013 0852   HCT 42.0 05/02/2023 1229   HCT 44.5 04/16/2013 0852   PLT 324 05/02/2023 1229   PLT 288 04/16/2013 0852   MCV 85.7 05/02/2023 1229   MCV 90 04/16/2013 0852   MCH 27.3 05/02/2023 1229   MCHC 31.9 05/02/2023 1229   RDW 13.4 05/02/2023 1229   RDW 12.9 04/16/2013 0852   LYMPHSABS 0.8 05/02/2023 1229  LYMPHSABS 1.2 04/16/2013 0852   MONOABS 0.8 05/02/2023 1229   MONOABS 0.9 04/16/2013 0852   EOSABS 0.1 05/02/2023 1229   EOSABS 0.2 04/16/2013 0852   BASOSABS 0.0 05/02/2023 1229   BASOSABS 0.1 04/16/2013 0852      Latest Ref Rng & Units 05/03/2023    4:07 AM 05/02/2023   12:29 PM 12/15/2022    9:03 AM  CMP  Glucose 70 - 99 mg/dL 657  846  962   BUN 8 - 23 mg/dL 32  29  20   Creatinine 0.61 - 1.24 mg/dL 9.52  8.41  3.24   Sodium 135 - 145 mmol/L 138  138  135   Potassium 3.5 - 5.1  mmol/L 4.1  4.4  4.7   Chloride 98 - 111 mmol/L 97  101  98   CO2 22 - 32 mmol/L 28  23  27    Calcium 8.9 - 10.3 mg/dL 8.9  8.8  8.7     Scheduled Meds:  aspirin EC  81 mg Oral QHS   atorvastatin  40 mg Oral QPM   enoxaparin (LOVENOX) injection  40 mg Subcutaneous Q24H   furosemide  40 mg Intravenous Q12H   guaiFENesin-dextromethorphan  15 mL Oral Q8H   mometasone-formoterol  2 puff Inhalation BID   Continuous Infusions:  Time  55  Rhetta Mura, MD  [facility]

## 2023-05-03 NOTE — TOC CM/SW Note (Signed)
Transition of Care [facility]) - Inpatient Brief Assessment   Patient Details  Name: Tommy Knight MRN: [id number] Date of Birth: 09-23-1945  Transition of Care [facility]) CM/SW Contact:    Villa Herb, LCSWA Phone Number: 05/03/2023, 8:46 AM   Clinical Narrative: Transition of Care Department [facility]) has reviewed patient and no TOC needs have been identified at this time. We will continue to monitor patient advancement through interdiciplinary progression rounds. If new patient transition needs arise, please place a TOC consult.   Transition of Care Asessment: Insurance and Status: Insurance coverage has been reviewed Patient has primary care physician: Yes Home environment has been reviewed: From home Prior level of function:: Independent Prior/Current Home Services: No current home services Social Drivers of Health Review: SDOH reviewed no interventions necessary Readmission risk has been reviewed: Yes Transition of care needs: no transition of care needs at this time

## 2023-05-03 NOTE — Plan of Care (Signed)

## 2023-05-04 DIAGNOSIS — Z7189 Other specified counseling: Secondary | ICD-10-CM | POA: Diagnosis not present

## 2023-05-04 DIAGNOSIS — I5023 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) heart failure: Secondary | ICD-10-CM | POA: Diagnosis not present

## 2023-05-04 DIAGNOSIS — Z515 Encounter for palliative care: Secondary | ICD-10-CM | POA: Diagnosis not present

## 2023-05-04 LAB — BASIC METABOLIC PANEL
Anion gap: 11 (ref 5–15)
BUN: 39 mg/dL — ABNORMAL HIGH (ref 8–23)
CO2: 29 mmol/L (ref 22–32)
Calcium: 8.4 mg/dL — ABNORMAL LOW (ref 8.9–10.3)
Chloride: 97 mmol/L — ABNORMAL LOW (ref 98–111)
Creatinine, Ser: 1.93 mg/dL — ABNORMAL HIGH (ref 0.61–1.24)
GFR, Estimated: 35 mL/min — ABNORMAL LOW (ref >60)
Glucose, Bld: 117 mg/dL — ABNORMAL HIGH (ref 70–99)
Potassium: 3.5 mmol/L (ref 3.5–5.1)
Sodium: 137 mmol/L (ref 135–145)

## 2023-05-04 LAB — MAGNESIUM: Magnesium: 1.7 mg/dL (ref 1.7–2.4)

## 2023-05-04 MED ORDER — CLOPIDOGREL BISULFATE 75 MG PO TABS: 75.0000 mg | ORAL_TABLET | Freq: Every day | ORAL | Status: DC

## 2023-05-04 MED ORDER — POTASSIUM CHLORIDE CRYS ER 20 MEQ PO TBCR
40.0000 meq | EXTENDED_RELEASE_TABLET | Freq: Four times a day (QID) | ORAL | Status: DC
Start: 2023-05-04 — End: 2023-05-04
  Administered 2023-05-04: 40 meq via ORAL
  Filled 2023-05-04: qty 2

## 2023-05-04 MED ORDER — MAGNESIUM SULFATE 2 GM/50ML IV SOLN
2.0000 g | Freq: Once | INTRAVENOUS | Status: AC
  Administered 2023-05-04: 2 g via INTRAVENOUS
  Filled 2023-05-04: qty 50

## 2023-05-04 MED ORDER — ALBUTEROL SULFATE HFA 108 (90 BASE) MCG/ACT IN AERS: 2.0000 | INHALATION_SPRAY | RESPIRATORY_TRACT | Status: DC | PRN

## 2023-05-04 MED ORDER — FUROSEMIDE 40 MG PO TABS
40.0000 mg | ORAL_TABLET | Freq: Two times a day (BID) | ORAL | 0 refills | Status: DC
Start: 2023-05-04 — End: 2023-06-03

## 2023-05-04 NOTE — Discharge Summary (Signed)
Physician Discharge Summary   Patient: Tommy Knight MRN: [id number] DOB: March 23, 1945  Admit date:     05/02/2023  Discharge date: 05/04/23  Discharge Physician: Deanna Artis   PCP: Erasmo Downer, NP   Recommendations at discharge:   At this time patient will be discharged home.  If you experience any symptoms such as fever, vomiting, shortness of breath, chest pain, abdominal pain, or other concerning symptoms, please call your primary care provider or go to the emergency department immediately.  Discharge Diagnoses: Principal Problem:   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) heart failure (HCC) Active Problems:   Tobacco use disorder   Essential hypertension   COPD (chronic obstructive pulmonary disease) (HCC)   Severe aortic stenosis   BPH (benign prostatic hyperplasia)   CKD (chronic kidney disease) stage 3, GFR 30-59 ml/min (HCC)  Resolved Problems:   * No resolved hospital problems. *  Hospital Course: No notes on file  Assessment and Plan: * 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) heart failure (HCC) -On presentation bilateral pitting lower extremity edema, chest x-ray with new moderate left pleural effusion, BNP elevated at greater than 4500.  History of severe aortic stenosis. Cardiology consulted, not much to offer with severe AS not a TAVR candidate with HFrEF LVEF 25 to 30% (Per last Echo- 11/2022).  Received palliative consultation to address chronic problems.  Responded quite well to IV diuresis, removing greater than 3 L.  Patient feeling improved, breathing well on room air, resolution of lower extremity edema.  Patient ambulatory and eager for discharge home.  Patient to p.o. Lasix 40 mg twice daily for the next 3 days.  Then recommend follow up with cardiology in 1 week.  CKD (chronic kidney disease) stage 3, GFR 30-59 ml/min (HCC) -CKD stage IIIa/B.  Cr-1.6, about baseline.  Severe aortic stenosis -Per cardiology consult- not much to offer with severe  AS,not a TAVR candidate with HFrEF LVEF 25 to 30% (Per last Echo- 11/2022).  Dr.Mallipeddi's note, patient also with severe upper and lower extremity peripheral artery disease, so not deemed a candidate for TAVR based on consensus from multidisciplinary team meeting.   COPD (chronic obstructive pulmonary disease) (HCC) Stable.  Reports chronic cough.  Not hypoxic. Sats- > 96% on room air. -125 mg given in ED, hold off on further steroids -Resume home regimen  Essential hypertension -Resume home medication regiment.   Tobacco use disorder Ongoing tobacco use, reports he has cut down from a pack a day to < than a third of a pack a day. -Patient counseled to quit smoking  Goals of care - Given patient's severe aortic stenosis with poor interventional prognosis, patient was given referral for palliative care.  Patient stated that he is amenable to treating his short-term symptoms including diuresis but readily endorsed DNR.  Encouraged patient to continue following with cardiology in the outpatient setting.         Consultants: Cardiology, palliative care Procedures performed: None Disposition: Home Diet recommendation:  Discharge Diet Orders (From admission, onward)     Start     Ordered   05/04/23 0000  Diet - low sodium heart healthy        05/04/23 1139           Cardiac diet DISCHARGE MEDICATION: Allergies as of 05/04/2023       Reactions   Bee Pollen Other (See Comments)   Seasonal allergies Trees         Medication List     TAKE these  medications    albuterol 108 (90 Base) MCG/ACT inhaler Commonly known as: VENTOLIN HFA Inhale 2 puffs into the lungs every 2 (two) hours as needed for wheezing or shortness of breath. What changed: when to take this   aspirin EC 81 MG tablet Take 1 tablet (81 mg total) by mouth at bedtime. Swallow whole.   atorvastatin 40 MG tablet Commonly known as: LIPITOR Take 40 mg by mouth every evening.   Centrum Silver 50+Men  Tabs Take 1 tablet by mouth daily.   PreserVision AREDS 2 Caps Take 1 capsule by mouth 2 (two) times daily.   clopidogrel 75 MG tablet Commonly known as: PLAVIX Take 1 tablet (75 mg total) by mouth daily. Start taking on: May 05, 2023   famotidine 20 MG tablet Commonly known as: Pepcid One after supper What changed:  how much to take how to take this when to take this additional instructions   fluticasone-salmeterol 250-50 MCG/ACT Aepb Commonly known as: ADVAIR Inhale 1 puff into the lungs in the morning and at bedtime.   furosemide 40 MG tablet Commonly known as: Lasix Take 1 tablet (40 mg total) by mouth 2 (two) times daily for 3 days. Thereafter take 40 mg daily as needed for weight gain greater than 5 pounds in 2 days.   ipratropium-albuterol 0.5-2.5 (3) MG/3ML Soln Commonly known as: DUONEB Take 3 mLs by nebulization every 6 (six) hours as needed (for shortness of breath).   levocetirizine 5 MG tablet Commonly known as: XYZAL Take 5 mg by mouth every evening.   montelukast 10 MG tablet Commonly known as: SINGULAIR Take 10 mg by mouth every evening.   omeprazole 20 MG capsule Commonly known as: PRILOSEC Take 20 mg by mouth daily.        Discharge Exam: Filed Weights   05/02/23 2152 05/03/23 0428 05/04/23 0520  Weight: 82.7 kg 81.6 kg 79.8 kg   GENERAL:  Alert, pleasant, no acute distress  HEENT:  EOMI CARDIOVASCULAR:  RRR, no murmurs appreciated RESPIRATORY:  Clear to auscultation, no wheezing, rales, or rhonchi GASTROINTESTINAL:  Soft, nontender, nondistended EXTREMITIES:  No LE edema bilaterally NEURO:  No new focal deficits appreciated SKIN:  No rashes noted PSYCH:  Appropriate mood and affect    Condition at discharge: improving  The results of significant diagnostics from this hospitalization (including imaging, microbiology, ancillary and laboratory) are listed below for reference.   Imaging Studies: DG Chest 2 View Result Date:  05/02/2023 CLINICAL DATA:  Shortness of breath. EXAM: CHEST - 2 VIEW COMPARISON:  CT angiography chest from 12/15/2022. FINDINGS: There is new moderate left pleural effusion with probable underlying compressive atelectatic changes and/or consolidation. There are increased interstitial markings throughout bilateral lungs, which is similar to the prior study. No pulmonary edema. Right lung and right lateral costophrenic angle are clear. Evaluation of cardiomediastinal silhouette is nondiagnostic due to left lower hemithorax opacification. No acute osseous abnormalities. The soft tissues are within normal limits. IMPRESSION: *New moderate left pleural effusion. Electronically Signed   By: Jules Schick M.D.   On: 05/02/2023 15:13    Microbiology: Results for orders placed or performed during the hospital encounter of 05/02/23  Resp panel by RT-PCR (RSV, Flu A&B, Covid) Anterior Nasal Swab     Status: None   Collection Time: 05/02/23  9:03 PM   Specimen: Anterior Nasal Swab  Result Value Ref Range Status   SARS Coronavirus 2 by RT PCR NEGATIVE NEGATIVE Final    Comment: (NOTE) SARS-CoV-2 target nucleic acids  are NOT DETECTED.  The SARS-CoV-2 RNA is generally detectable in upper respiratory specimens during the acute phase of infection. The lowest concentration of SARS-CoV-2 viral copies this assay can detect is 138 copies/mL. A negative result does not preclude SARS-Cov-2 infection and should not be used as the sole basis for treatment or other patient management decisions. A negative result may occur with  improper specimen collection/handling, submission of specimen other than nasopharyngeal swab, presence of viral mutation(s) within the areas targeted by this assay, and inadequate number of viral copies(<138 copies/mL). A negative result must be combined with clinical observations, patient history, and epidemiological information. The expected result is Negative.  Fact Sheet for Patients:   BloggerCourse.com  Fact Sheet for Healthcare Providers:  SeriousBroker.it  This test is no t yet approved or cleared by the [place] FDA and  has been authorized for detection and/or diagnosis of SARS-CoV-2 by FDA under an Emergency Use Authorization (EUA). This EUA will remain  in effect (meaning this test can be used) for the duration of the COVID-19 declaration under Section 564(b)(1) of the Act, 21 U.S.C.section 360bbb-3(b)(1), unless the authorization is terminated  or revoked sooner.       Influenza A by PCR NEGATIVE NEGATIVE Final   Influenza B by PCR NEGATIVE NEGATIVE Final    Comment: (NOTE) The Xpert Xpress SARS-CoV-2/FLU/RSV plus assay is intended as an aid in the diagnosis of influenza from Nasopharyngeal swab specimens and should not be used as a sole basis for treatment. Nasal washings and aspirates are unacceptable for Xpert Xpress SARS-CoV-2/FLU/RSV testing.  Fact Sheet for Patients: BloggerCourse.com  Fact Sheet for Healthcare Providers: SeriousBroker.it  This test is not yet approved or cleared by the [place] FDA and has been authorized for detection and/or diagnosis of SARS-CoV-2 by FDA under an Emergency Use Authorization (EUA). This EUA will remain in effect (meaning this test can be used) for the duration of the COVID-19 declaration under Section 564(b)(1) of the Act, 21 U.S.C. section 360bbb-3(b)(1), unless the authorization is terminated or revoked.     Resp Syncytial Virus by PCR NEGATIVE NEGATIVE Final    Comment: (NOTE) Fact Sheet for Patients: BloggerCourse.com  Fact Sheet for Healthcare Providers: SeriousBroker.it  This test is not yet approved or cleared by the [place] FDA and has been authorized for detection and/or diagnosis of SARS-CoV-2 by FDA under an Emergency Use  Authorization (EUA). This EUA will remain in effect (meaning this test can be used) for the duration of the COVID-19 declaration under Section 564(b)(1) of the Act, 21 U.S.C. section 360bbb-3(b)(1), unless the authorization is terminated or revoked.  Performed at [facility], [address] [place], [place] [address]     Labs: CBC: Recent Labs  Lab 05/02/23 1229 05/03/23 0407  WBC 10.8* 6.7  NEUTROABS 9.0* 5.8  HGB 13.4 14.0  HCT 42.0 43.5  MCV 85.7 87.5  PLT 324 346   Basic Metabolic Panel: Recent Labs  Lab 05/02/23 1229 05/03/23 0407 05/04/23 0406  NA 138 138 137  K 4.4 4.1 3.5  CL 101 97* 97*  CO2 23 28 29   GLUCOSE 109* 146* 117*  BUN 29* 32* 39*  CREATININE 1.63* 1.87* 1.93*  CALCIUM 8.8* 8.9 8.4*  MG  --   --  1.7   Liver Function Tests: No results for input(s): "AST", "ALT", "ALKPHOS", "BILITOT", "PROT", "ALBUMIN" in the last 168 hours. CBG: No results for input(s): "GLUCAP" in the last 168 hours.  Discharge time spent: less than 30  minutes.  Signed: Deanna Artis, DO [facility] 05/04/2023

## 2023-05-04 NOTE — Progress Notes (Signed)
Palliative: Mr. Tommy Knight is sitting up on the stool in the room.  He appears chronically ill.  He greets me, making and mostly keeping eye contact.  He is alert and oriented, able to make his needs known.  His wife of 58 years, [name], is present at bedside.  We talked about his plan for discharge to home today.  No questions at this time.  [name] states that she has already spoken with their trusted cardiologist and they have a follow-up appointment.  We talk about aortic valve stenosis and I encouraged them to work closely with cardiology.  Conference with attending, bedside nursing staff, transition of care team related to patient condition, needs, goals of care, disposition.  Plan: Continue to treat the treatable.  Considering CODE STATUS.  No outpatient palliative at this time.       35 minutes  Lillia Carmel, NP Palliative medicine team Team phone [phone]

## 2023-05-04 NOTE — Care Management Important Message (Signed)
Important Message  Patient Details  Name: Tommy Knight MRN: [id number] Date of Birth: [birth year]   Important Message Given:  Yes - Medicare IM     Corey Harold 05/04/2023, 11:00 AM

## 2023-05-04 NOTE — Progress Notes (Signed)
Rounding Note    Patient Name: Tommy Knight Date of Encounter: 05/04/2023  [facility] Cardiologist: Marjo Bicker, MD   Subjective   Some ongoing SOB  Inpatient Medications    Scheduled Meds:  aspirin EC  81 mg Oral QHS   atorvastatin  40 mg Oral QPM   clopidogrel  75 mg Oral Daily   enoxaparin (LOVENOX) injection  40 mg Subcutaneous Q24H   furosemide  40 mg Intravenous [id number]   mometasone-formoterol  2 puff Inhalation BID   nicotine  21 mg Transdermal Daily   Continuous Infusions:  PRN Meds: acetaminophen **OR** acetaminophen, albuterol, ipratropium-albuterol, ondansetron **OR** ondansetron (ZOFRAN) IV, mouth rinse, polyethylene glycol   Vital Signs    Vitals:   05/03/23 2010 05/03/23 2213 05/04/23 0520 05/04/23 0754  BP: 116/87  (!) 113/56   Pulse: 90  85   Resp: 20  20   Temp: (!) 97.4 F (36.3 C)  98 F (36.7 C)   TempSrc: Oral     SpO2: 91% 92% 95% 96%  Weight:   79.8 kg   Height:        Intake/Output Summary (Last 24 hours) at 05/04/2023 0852 Last data filed at 05/04/2023 0530 Gross per 24 hour  Intake 480 ml  Output 3375 ml  Net -2895 ml      05/04/2023    5:20 AM 05/03/2023    4:28 AM 05/02/2023    9:52 PM  Last 3 Weights  Weight (lbs) 175 lb 14.8 oz 179 lb 14.3 oz 182 lb 5.1 oz  Weight (kg) 79.8 kg 81.6 kg 82.7 kg      Telemetry    NSR - Personally Reviewed  ECG    N/a - Personally Reviewed  Physical Exam   GEN: No acute distress.   Neck: No JVD Cardiac: RRR, 3/6 systolic murmur rusb Respiratory: Clear to auscultation bilaterally. GI: Soft, nontender, non-distended  MS: No edema; No deformity. Neuro:  Nonfocal  Psych: Normal affect   Labs    High Sensitivity Troponin:   Recent Labs  Lab 05/02/23 1533 05/02/23 1707  TROPONINIHS 53* 52*     Chemistry Recent Labs  Lab 05/02/23 1229 05/03/23 0407 05/04/23 0406  NA 138 138 137  K 4.4 4.1 3.5  CL 101 97* 97*  CO2 23 28 29   GLUCOSE 109* 146* 117*   BUN 29* 32* 39*  CREATININE 1.63* 1.87* 1.93*  CALCIUM 8.8* 8.9 8.4*  MG  --   --  1.7  GFRNONAA 43* 36* 35*  ANIONGAP 14 13 11     Lipids No results for input(s): "CHOL", "TRIG", "HDL", "LABVLDL", "LDLCALC", "CHOLHDL" in the last 168 hours.  Hematology Recent Labs  Lab 05/02/23 1229 05/03/23 0407  WBC 10.8* 6.7  RBC 4.90 4.97  HGB 13.4 14.0  HCT 42.0 43.5  MCV 85.7 87.5  MCH 27.3 28.2  MCHC 31.9 32.2  RDW 13.4 13.8  PLT 324 346   Thyroid No results for input(s): "TSH", "FREET4" in the last 168 hours.  BNP Recent Labs  Lab 05/02/23 1229  BNP >4,500.0*    DDimer No results for input(s): "DDIMER" in the last 168 hours.   Radiology    DG Chest 2 View Result Date: 05/02/2023 CLINICAL DATA:  Shortness of breath. EXAM: CHEST - 2 VIEW COMPARISON:  CT angiography chest from 12/15/2022. FINDINGS: There is new moderate left pleural effusion with probable underlying compressive atelectatic changes and/or consolidation. There are increased interstitial markings throughout bilateral lungs, which  is similar to the prior study. No pulmonary edema. Right lung and right lateral costophrenic angle are clear. Evaluation of cardiomediastinal silhouette is nondiagnostic due to left lower hemithorax opacification. No acute osseous abnormalities. The soft tissues are within normal limits. IMPRESSION: *New moderate left pleural effusion. Electronically Signed   By: Jules Schick M.D.   On: 05/02/2023 15:13    Cardiac Studies    Patient Profile     Tommy Knight is a 78 y.o. male with a hx of severe PAD, mild to mod CAD, severe aortic stenosis not a TAVR candidate  who is being seen 05/03/2023 for the evaluation of SOB at the request of Dr Mahala Menghini.   Assessment & Plan    1.Acute on chronic HFrEF complicated by severe low flow low gradient AS - Echo 11/2022: LVE 25-30%, grade III dd, severe LAE, severe AS mean grad 28, AVA VTI 0.61, DI 0.21   - CXR left effusion, BNP >4500 - he is on IV  lasix 40mg  bid, negative 2.8 L yesterday and 3.4 L since admission. Standing weights 182-->175 lbs this admit. Uptrend in Cr uptrend in Cr with diuresis, accept higher Cr for now in order to diurese.  -Perhaps some low output leading to Cr rise with diuresis, not a candidate for inotropes given endstage HF/valve disease not a TAVR candidate. Diurese best we can and accept high Cr, however will lower lasix to 40mg  x 1 today.  - from notes medical therapy has been limited by hypotension. I think given unfixable valve no real benefit in trying to reintroduce HF regimen now.     -remains fluid overloaded, lower dose lasix today given rise in Cr.    2.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 Discussions with primary cardiologist at last clinic visit to pursue palliative consultation - presents volume overloaded - can diurese best we can for symptoms but overall poor short term prognosis, seen by palliative. He is DNR/DNI, continue to treat the treatable.   For questions or updates, please contact [facility] Please consult [email or web address] for contact info under        Signed, Dina Rich, MD  05/04/2023, 8:52 AM

## 2023-05-04 NOTE — Progress Notes (Signed)
   05/04/23 0800  ReDS Vest / Clip  Station Marker D  Ruler Value 37  ReDS Value Range < 36  ReDS Actual Value 36

## 2023-05-25 ENCOUNTER — Encounter: Payer: Self-pay | Admitting: Internal Medicine

## 2023-05-25 ENCOUNTER — Ambulatory Visit: Payer: Medicare Other | Attending: Internal Medicine | Admitting: Internal Medicine

## 2023-05-25 VITALS — BP 116/80 | HR 90 | Ht 71.5 in | Wt 177.0 lb

## 2023-05-25 DIAGNOSIS — I350 Nonrheumatic aortic (valve) stenosis: Secondary | ICD-10-CM | POA: Diagnosis not present

## 2023-05-25 DIAGNOSIS — I2510 Atherosclerotic heart disease of native coronary artery without angina pectoris: Secondary | ICD-10-CM | POA: Diagnosis not present

## 2023-05-25 DIAGNOSIS — I428 Other cardiomyopathies: Secondary | ICD-10-CM | POA: Diagnosis not present

## 2023-05-25 DIAGNOSIS — I5022 Chronic systolic (congestive) heart failure: Secondary | ICD-10-CM | POA: Diagnosis not present

## 2023-05-25 DIAGNOSIS — F17210 Nicotine dependence, cigarettes, uncomplicated: Secondary | ICD-10-CM

## 2023-05-25 DIAGNOSIS — I739 Peripheral vascular disease, unspecified: Secondary | ICD-10-CM

## 2023-05-25 NOTE — Patient Instructions (Signed)
Medication Instructions:  Your physician recommends that you continue on your current medications as directed. Please refer to the Current Medication list given to you today.  *If you need a refill on your cardiac medications before your next appointment, please call your pharmacy*   Lab Work: None If you have labs (blood work) drawn today and your tests are completely normal, you will receive your results only by: MyChart Message (if you have MyChart) OR A paper copy in the mail If you have any lab test that is abnormal or we need to change your treatment, we will call you to review the results.   Testing/Procedures: None   Follow-Up: At [facility], you and your health needs are our priority.  As part of our continuing mission to provide you with exceptional heart care, we have created designated Provider Care Teams.  These Care Teams include your primary Cardiologist (physician) and Advanced Practice Providers (APPs -  Physician Assistants and Nurse Practitioners) who all work together to provide you with the care you need, when you need it.  We recommend signing up for the patient portal called "MyChart".  Sign up information is provided on this After Visit Summary.  MyChart is used to connect with patients for Virtual Visits (Telemedicine).  Patients are able to view lab/test results, encounter notes, upcoming appointments, etc.  Non-urgent messages can be sent to your provider as well.   To learn more about what you can do with MyChart, go to [email or web address].    Your next appointment:   3 month(s)  Provider:   You may see Vishnu P Mallipeddi, MD or one of the following Advanced Practice Providers on your designated Care Team:   [place], PA-C  Jacolyn Reedy, [place]     Other Instructions

## 2023-05-25 NOTE — Progress Notes (Signed)
 Cardiology Office Note  Date: 05/25/2023   ID: Tommy Knight, DOB 06-05-1945, MRN [id number]  PCP:  Erasmo Downer, NP  Cardiologist:  Marjo Bicker, MD Electrophysiologist:  None   History of Present Illness: Tommy Knight is a 78 y.o. male known to have mild to moderate nonobstructive CAD, severe upper extremity and lower extremity PAD (subclavian artery stenosis, bilateral common iliac artery stenosis, common femoral stenosis), severe aortic valve stenosis (not a TAVR candidate), nicotine abuse is here for follow-up visit.  Multidisciplinary imaging review conducted in October 2024 and the consensus was that patient is not a candidate for TAVR given significant PAD and carotid approach was not feasible due to disease at the ostium of the left carotid.  Medical management was strongly recommended and palliative care if necessary.  He had ADHF hospitalization at [facility] in February 2025. Patient is here for follow-up visit, accompanied by his wife.    He bought electric scooter last week and not exerting himself much.  Currently taking 36 ounces of water.  He continues to have shortness of breath but significantly improved compared to recent hospitalization when he was admitted.  His weights were 190 pounds in December 2024 and today it is 177 pounds.  He is aware of the HF instructions to take additional diuretics if there is any weight gain.  He was seen by palliative care team while inpatient, he is currently DNR and DNI.  Will treat the treatable.  Refused outpatient palliative care follow-up and hospice.  Does not have other symptoms of syncope, leg swelling or palpitations.  He does get chest heaviness with exertion.  But he is not exerting much these days after he bought electric scooter.  Past Medical History:  Diagnosis Date   Allergic rhinitis    Asthma    Carotid artery disease (HCC)    COPD (chronic obstructive pulmonary disease) (HCC)    Hyperlipidemia    Hypertension     Stenosis of left subclavian artery (HCC)    Stroke [facility])     Past Surgical History:  Procedure Laterality Date   APPENDECTOMY     RIGHT/LEFT HEART CATH AND CORONARY ANGIOGRAPHY N/A 12/02/2022   Procedure: RIGHT/LEFT HEART CATH AND CORONARY ANGIOGRAPHY;  Surgeon: Orbie Pyo, MD;  Location: [facility] INVASIVE CV LAB;  Service: Cardiovascular;  Laterality: N/A;    Current Outpatient Medications  Medication Sig Dispense Refill   albuterol (VENTOLIN HFA) 108 (90 Base) MCG/ACT inhaler Inhale 2 puffs into the lungs every 2 (two) hours as needed for wheezing or shortness of breath.     aspirin EC 81 MG tablet Take 1 tablet (81 mg total) by mouth at bedtime. Swallow whole. 30 tablet 12   atorvastatin (LIPITOR) 40 MG tablet Take 40 mg by mouth every evening.     clopidogrel (PLAVIX) 75 MG tablet Take 1 tablet (75 mg total) by mouth daily.     famotidine (PEPCID) 20 MG tablet One after supper (Patient taking differently: Take 20 mg by mouth daily.) 30 tablet 11   fluticasone-salmeterol (ADVAIR) 250-50 MCG/ACT AEPB Inhale 1 puff into the lungs in the morning and at bedtime.     furosemide (LASIX) 40 MG tablet Take 1 tablet (40 mg total) by mouth 2 (two) times daily for 3 days. Thereafter take 40 mg daily as needed for weight gain greater than 5 pounds in 2 days. 30 tablet 0   ipratropium-albuterol (DUONEB) 0.5-2.5 (3) MG/3ML SOLN Take 3 mLs by nebulization every 6 (six)  hours as needed (for shortness of breath).     levocetirizine (XYZAL) 5 MG tablet Take 5 mg by mouth every evening.     montelukast (SINGULAIR) 10 MG tablet Take 10 mg by mouth every evening.     Multiple Vitamins-Minerals (CENTRUM SILVER 50+MEN) TABS Take 1 tablet by mouth daily.     Multiple Vitamins-Minerals (PRESERVISION AREDS 2) CAPS Take 1 capsule by mouth 2 (two) times daily.     omeprazole (PRILOSEC) 20 MG capsule Take 20 mg by mouth daily.     No current facility-administered medications for this visit.   Allergies:  Bee  pollen   Social History: The patient  reports that he has been smoking. He has never used smokeless tobacco. He reports current alcohol use. He reports that he does not use drugs.   Family History: The patient's family history includes Heart disease in an other family member.   ROS:  Please see the history of present illness. Otherwise, complete review of systems is positive for none.  All other systems are reviewed and negative.   Physical Exam: VS:  BP 116/80 (Cuff Size: Normal)   Pulse 90   Ht 5' 11.5" (1.816 m)   Wt 177 lb (80.3 kg)   SpO2 91%   BMI 24.34 kg/m , BMI Body mass index is 24.34 kg/m.  Wt Readings from Last 3 Encounters:  05/25/23 177 lb (80.3 kg)  05/04/23 175 lb 14.8 oz (79.8 kg)  03/14/23 190 lb 6.4 oz (86.4 kg)    General: Patient appears comfortable at rest. HEENT: Conjunctiva and lids normal, oropharynx clear with moist mucosa. Neck: Supple, no elevated JVP or carotid bruits, no thyromegaly. Lungs: Clear to auscultation, nonlabored breathing at rest. Cardiac: Regular rate and rhythm, no S3 or significant systolic murmur, no pericardial rub. Abdomen: Soft, nontender, no hepatomegaly, bowel sounds present, no guarding or rebound. Extremities: No pitting edema, distal pulses 2+. Skin: Warm and dry. Musculoskeletal: No kyphosis. Neuropsychiatric: Alert and oriented x3, affect grossly appropriate.  Recent Labwork: 11/28/2022: ALT 25; AST 31 11/30/2022: TSH 2.215 05/02/2023: B Natriuretic Peptide >4,500.0 05/03/2023: Hemoglobin 14.0; Platelets 346 05/04/2023: BUN 39; Creatinine, Ser 1.93; Magnesium 1.7; Potassium 3.5; Sodium 137     Component Value Date/Time   CHOL 195 12/01/2022 0425   TRIG 111 12/01/2022 0425   HDL 52 12/01/2022 0425   CHOLHDL 3.8 12/01/2022 0425   VLDL 22 12/01/2022 0425   LDLCALC 121 (H) 12/01/2022 0425     Assessment and Plan:  Posthospitalization follow-up Severe aortic valve stenosis with CDM LVEF 25 to 30% (not a TAVR  candidate) Severe upper and lower extremity PAD Mild to moderate nonobstructive CAD Nicotine abuse   -Recently hospitalized at [facility] for ADHF.  Palliative consulted inpatient, he is currently DNR and DNI, will treat the treatable.  He refused outpatient palliative care follow-up and hospice care.  He continues to have SOB while speaking, which significantly improved, currently 177 pounds compared to 190 pounds in December 2024.  His symptoms also improved significantly, per him.  He is aware of the HF instructions to increase diuretic regimen if any weight gain.  Continue p.o. Lasix 40 mg twice daily.  Has electric scooter.  Not a candidate for initiation of GDMT due to soft BP. -Continue DAPT, atorvastatin 40 mg nightly.   Medication Adjustments/Labs and Tests Ordered: Current medicines are reviewed at length with the patient today.  Concerns regarding medicines are outlined above.    Disposition:  Follow up 3 months  Signed,  Kansas Spainhower Verne Spurr, MD, 05/25/2023 10:02 AM    [facility] [address] [place], [place] [address]

## 2023-05-25 NOTE — Progress Notes (Signed)
 Tommy Knight, male    DOB: 1945-03-22    MRN: [id number]   Brief patient profile:  41  yowm  active smoker with onset allergies 60s mostly allergies in spring = rhinitis and then sev years later started needing nebulizer  referred to pulmonary clinic in [place]  01/31/2023 by Ronnell Freshwater who is treating him for Aortic stenosis since around 11/2022 and admitted with decompensation   - CT chest 12/15/22 Mild paraseptal emphysema. - PFTs requested from [facility] practice 01/31/2023 >>> never returned/ multiple requests     History of Present Illness  01/31/2023  Pulmonary/ 1st office eval/ [name] / [place] Office off acei x early September 2024   Chief Complaint  Patient presents with   Establish Care   Shortness of Breath  Dyspnea:  shop and back 100 ft slt hills stops half way both ways  Cough: brown mucus worse all day long  Sleep: < 30 degrees electric bed  1 pillow  SABA use: neb bid/ hfa all the day  02: none Rec Plan A = Automatic = Always=   stop advair and start  Breztri Take 2 puffs first thing in am and then another 2 puffs about 12 hours later.   Work on inhaler technique:  >>>  Remember how golfers warm up by taking practice swings - do this with an empty inhaler  Plan B = Backup (to supplement plan A, not to replace it) Only use your albuterol inhaler as a rescue medication Plan C = Crisis (instead of Plan B but only if Plan B stops working) - only use your albuterol nebulizer if you first try Plan B   The key is to stop smoking completely before smoking completely stops you! Omeprazole 20 mg change to take x 2 pills   x   30-60 min before first meal of the day and Pepcid (famotidine)  20 mg after supper until return to office GERD diet reviewed, bed blocks rec    Please schedule a follow up office visit in 6 weeks, call sooner if needed with all medications /inhalers/ solutions in hand     03/14/2023  f/u ov/[place] office/Ilisa Hayworth re: AB/ COPD GOLD ?  maint on  breztri did bring meds  Chief Complaint  Patient presents with   Follow-up    Follow up   Dyspnea:  no change doe to house from shop - uphill to house but stops half way both ways (turns out it's his legs that give out before his sob)   Cough: white mucus some worse in am and sporadic daytime > noct  Sleeping: 30 degrees   SABA use: up 3 x daily / neb rarely  02: none  Rec Plan A = Automatic = Always=    Advair 250/50 one first thing in am and 12 hours laters  -  - smooth deep breath, hold it 5 seconds Plan B = Backup (to supplement plan A, not to replace it) Only use your albuterol inhaler as a rescue medication  Plan C = Crisis (instead of Plan B but only if Plan B stops working) - only use your albuterol nebulizer if you first try Plan B and it fails to help > ok to use the nebulizer up to every 4 hours but if start needing it regularly call for immediate appointment Also  Ok to try albuterol 15 min before an activity (on alternating days with inhaler vs the nebulizer vs nothing )  that you know would usually make  you short of breath   Please schedule a follow up visit in 3 months but call sooner if needed  with all medications /inhalers/ solutions in hand so we can verify exactly what you are taking. This includes all medications from all doctors and over the counters with PFTs on return.       05/26/2023  f/u ov/[place] office/Amyri Frenz re: doe/cough  maint on advair 250 / 50   did not  bring meds as rec  Chief Complaint  Patient presents with   Follow-up    3 month follow up   Dyspnea:  limited by calf pain "circulation/ nothing can be done"  Cough: worse at hs but settles over first   hour x years - no better on advair or breztri Sleeping: no    resp cc  SABA use: neb bid and prn saba  02: none   No obvious day to day or daytime variability or assoc excess/ purulent sputum or mucus plugs or hemoptysis or cp or chest tightness, subjective wheeze or overt sinus or hb symptoms.     Also denies any obvious fluctuation of symptoms with weather or environmental changes or other aggravating or alleviating factors except as outlined above   No unusual exposure hx or h/o childhood pna/ asthma or knowledge of premature birth.  Current Allergies, Complete Past Medical History, Past Surgical History, Family History, and Social History were reviewed in [employer] record.  ROS  The following are not active complaints unless bolded Hoarseness, sore throat, dysphagia, dental problems, itching, sneezing,  nasal congestion or discharge of excess mucus or purulent secretions, ear ache,   fever, chills, sweats, unintended wt loss or wt gain, classically pleuritic or exertional cp,  orthopnea pnd or arm/hand swelling  or leg swelling, presyncope, palpitations, abdominal pain, anorexia, nausea, vomiting, diarrhea  or change in bowel habits or change in bladder habits, change in stools or change in urine, dysuria, hematuria,  rash, arthralgias, visual complaints, headache, numbness, weakness or ataxia or problems with walking or coordination,  change in mood or  memory.        Current Meds  Medication Sig   albuterol (VENTOLIN HFA) 108 (90 Base) MCG/ACT inhaler Inhale 2 puffs into the lungs every 2 (two) hours as needed for wheezing or shortness of breath.   aspirin EC 81 MG tablet Take 1 tablet (81 mg total) by mouth at bedtime. Swallow whole.   atorvastatin (LIPITOR) 40 MG tablet Take 40 mg by mouth every evening.   clopidogrel (PLAVIX) 75 MG tablet Take 1 tablet (75 mg total) by mouth daily.   famotidine (PEPCID) 20 MG tablet One after supper (Patient taking differently: Take 20 mg by mouth daily.)   fluticasone-salmeterol (ADVAIR) 250-50 MCG/ACT AEPB Inhale 1 puff into the lungs in the morning and at bedtime.   ipratropium-albuterol (DUONEB) 0.5-2.5 (3) MG/3ML SOLN Take 3 mLs by nebulization every 6 (six) hours as needed (for shortness of breath).    levocetirizine (XYZAL) 5 MG tablet Take 5 mg by mouth every evening.   montelukast (SINGULAIR) 10 MG tablet Take 10 mg by mouth every evening.   Multiple Vitamins-Minerals (CENTRUM SILVER 50+MEN) TABS Take 1 tablet by mouth daily.   Multiple Vitamins-Minerals (PRESERVISION AREDS 2) CAPS Take 1 capsule by mouth 2 (two) times daily.   omeprazole (PRILOSEC) 20 MG capsule Take 20 mg by mouth daily.              Past Medical History:  Diagnosis Date  Allergic rhinitis    Asthma    Carotid artery disease (HCC)    COPD (chronic obstructive pulmonary disease) (HCC)    Hyperlipidemia    Hypertension    Stenosis of left subclavian artery (HCC)    Stroke (HCC)       Objective:    Wts   05/26/2023       177   03/14/23 190 lb 6.4 oz (86.4 kg)  02/08/23 189 lb 3.2 oz (85.8 kg)  01/31/23 189 lb (85.7 kg)     Vital signs reviewed  05/26/2023  - Note at rest 02 sats  94% on RA   General appearance:    elderly wm walking with cane   HEENT :  Oropharynx  clear/ edentulous       NECK :  without JVD/Nodes/TM/ nl carotid upstrokes bilaterally   LUNGS: no acc muscle use,  Mod barrel  contour chest wall with bilateral  Distant bs s audible wheeze and  without cough on insp or exp maneuvers and mod  Hyperresonant  to  percussion bilaterally     CV:  RRR  no s3 or murmur or increase in P2, and no edema   ABD:  soft and nontender with pos mid insp Hoover's  in the supine position. No bruits or organomegaly appreciated, bowel sounds nl  MS:   Ext warm without deformities or   obvious joint restrictions , calf tenderness, cyanosis or clubbing  SKIN: warm and dry without lesions    NEURO:  alert, approp, nl sensorium with  no motor or cerebellar deficits apparent.               I personally reviewed images and agree with radiology impression as follows:   Chest CTa  12/15/22 Central airways are patent. No consolidation, pleural effusion or pneumothorax. Mild ground-glass opacities of  the left-greater-than-right lower lobes, likely due to scarring or atelectasis. Mild paraseptal emphysema. Calcified nodule of the right upper lobe, likely sequela of prior granulomatous infection. Trace left pleural effusion.           Assessment

## 2023-05-26 ENCOUNTER — Ambulatory Visit: Payer: Medicare HMO | Admitting: Internal Medicine

## 2023-05-26 ENCOUNTER — Encounter: Payer: Self-pay | Admitting: Internal Medicine

## 2023-05-26 VITALS — BP 99/70 | HR 98 | Ht 71.5 in | Wt 177.8 lb

## 2023-05-26 DIAGNOSIS — F17210 Nicotine dependence, cigarettes, uncomplicated: Secondary | ICD-10-CM | POA: Diagnosis not present

## 2023-05-26 DIAGNOSIS — R0609 Other forms of dyspnea: Secondary | ICD-10-CM

## 2023-05-26 MED ORDER — BUDESONIDE 0.25 MG/2ML IN SUSP: RESPIRATORY_TRACT | 12 refills | Status: DC

## 2023-05-26 NOTE — Patient Instructions (Addendum)
 I would be happy to review PFTs  from  [facility] practice if you can acquire them > just bring them with you next visit   Try off advair disc as it may be contributing to cough   Add budesonide 0.25 mg  twice daily (no more than that)  with your nebulizer  duoneb.   Please schedule a follow up visit in 3 months but call sooner if needed with PFTs on return if possible

## 2023-05-28 ENCOUNTER — Telehealth: Payer: Self-pay | Admitting: Internal Medicine

## 2023-05-28 NOTE — Assessment & Plan Note (Signed)

## 2023-05-28 NOTE — Telephone Encounter (Signed)
 Needs pfts ordered preferable on day of f/u ov in 20m

## 2023-05-28 NOTE — Assessment & Plan Note (Addendum)
 Active smoker -  Echo 11/29/22  1. Left ventricular ejection fraction, by estimation, is 25 to 30%. The  left ventricle has severely decreased function. The left ventricle  demonstrates global hypokinesis. Left ventricular diastolic parameters are  consistent with Grade III diastolic  dysfunction (restrictive). Elevated left atrial pressure.   2. Right ventricular systolic function is mildly reduced. The right  ventricular size is normal. Tricuspid regurgitation signal is inadequate  for assessing PA pressure.   3. Left atrial size was severely dilated.   4. Right atrial size was mildly dilated.   5. The mitral valve is abnormal. Trivial mitral valve regurgitation. No  evidence of mitral stenosis. Moderate mitral annular calcification.   6. The aortic valve was not well visualized but there appears to be  severe restriction of the aortic valve leaflet opening. There is severe  calcifcation of the aortic valve. Aortic valve regurgitation is not  visualized. Aortic valve mean gradient  measures 28.0 mmHg. Aortic valve Vmax measures 3.39 m/s. DVI is 0.21.  Overall, this is likely severe aortic valve stenosis, low flow-low  gradient in the setting of severely reduced LVEF.   7. The inferior vena cava is dilated in size with >50% respiratory  variability, suggesting right atrial pressure of 8 mmHg.  - CT chest 12/15/22 Mild paraseptal emphysema. - PFTs requested from [facility] practice 01/31/2023 and again 03/14/2023  - 01/31/2023  After extensive coaching inhaler device,  effectiveness =    60% > breztri trial  - 01/31/2023   Walked on RA  x  2  lap(s) =  approx 300  ft  @ mod /cane pace, stopped due to knee pain  with lowest 02 sats 94%   - 03/14/2023   Walked on RA  x  3  lap(s) =  approx 450  ft  @ slow  pace, stopped due to end of study, legs sore,  with lowest 02 sats 93%   Not really clear he's benefiting from advair and may be contributing to cough so rec change to duoneb bid with  budesonide 0.25 mg bid plus extra saba prn to see what if any change in symptoms and f/u with pfts w/a  - if worse can always just restart advair 250.         Each maintenance medication was reviewed in detail including emphasizing most importantly the difference between maintenance and prns and under what circumstances the prns are to be triggered using an action plan format where appropriate.  Total time for H and P, chart review, counseling, reviewing neb device(s) and generating customized AVS unique to this office visit / same day charting = 25 min

## 2023-05-30 ENCOUNTER — Other Ambulatory Visit: Payer: Self-pay

## 2023-05-30 DIAGNOSIS — R0609 Other forms of dyspnea: Secondary | ICD-10-CM

## 2023-05-30 DIAGNOSIS — F17210 Nicotine dependence, cigarettes, uncomplicated: Secondary | ICD-10-CM

## 2023-05-30 NOTE — Telephone Encounter (Signed)
 Placed a new  order for patient, pt was scheduled for PFT  and appt was canceled.

## 2023-06-02 ENCOUNTER — Telehealth: Payer: Self-pay | Admitting: Internal Medicine

## 2023-06-02 NOTE — Telephone Encounter (Signed)
 Returned call to pt. No answer. Left msg to call back.

## 2023-06-02 NOTE — Telephone Encounter (Signed)
 Pt returning call to a nurse

## 2023-06-02 NOTE — Telephone Encounter (Signed)
 Pt c/o swelling/edema: STAT if pt has developed SOB within 24 hours  If swelling, where is the swelling located? Feet and ankles   How much weight have you gained and in what time span?   Have you gained 2 pounds in a day or 5 pounds in a week?   Do you have a log of your daily weights (if so, list)?   Are you currently taking a fluid pill? yes  Are you currently SOB? a little  Have you traveled recently in a car or plane for an extended period of time?

## 2023-06-02 NOTE — Telephone Encounter (Signed)
 Spoke with [name] ( wife) who reports that pt has swelling in his feet and ankles that started over the weekend. Current weights are 174, 175,174.5, 174. Sleeps elevated at night. Pt states he is SOB all the time. Pt reports taking Lasix 40 mg daily. Does elevate feet and legs at times. Denies CP at this time.

## 2023-06-03 MED ORDER — FUROSEMIDE 40 MG PO TABS
40.0000 mg | ORAL_TABLET | Freq: Two times a day (BID) | ORAL | 11 refills | Status: DC
Start: 2023-06-03 — End: 2023-08-16

## 2023-06-03 NOTE — Telephone Encounter (Signed)
Left a message for patient to call office back regarding provider recommendations.

## 2023-06-03 NOTE — Telephone Encounter (Signed)
 Patient wife (DPR) notified and verbalized understanding.

## 2023-06-13 ENCOUNTER — Ambulatory Visit: Payer: Medicare HMO | Admitting: Internal Medicine

## 2023-06-17 ENCOUNTER — Telehealth: Payer: Self-pay | Admitting: Internal Medicine

## 2023-06-17 NOTE — Telephone Encounter (Signed)
 LVM for patien tto call and discuss scheduling the PFT ordered by Dr. [name]

## 2023-06-20 ENCOUNTER — Telehealth: Payer: Self-pay | Admitting: Internal Medicine

## 2023-06-20 NOTE — Telephone Encounter (Signed)
 Patient's wife returned my call to discuss scheduling he PFT ordered by Dr. Peterson Lombard also states her husband's breathing has not gotten any better----call back [phone]

## 2023-06-20 NOTE — Telephone Encounter (Signed)
 Spoke with Tommy Knight (on [place]) regarding the Thursday 07/28/23 3:00 pm PFT appointment at [facility] time is 2:45 pm--1st floor registration desk for check in----informed patient there were sooner appointments at in [place] and she stated they did not want to drive to [employer] information to patient and she voiced her understanding

## 2023-06-20 NOTE — Telephone Encounter (Signed)
 Please schedule in any open slot this week. Thank you!

## 2023-06-21 ENCOUNTER — Ambulatory Visit: Admitting: Internal Medicine

## 2023-06-21 ENCOUNTER — Ambulatory Visit (HOSPITAL_COMMUNITY)
Admission: RE | Admit: 2023-06-21 | Discharge: 2023-06-21 | Disposition: A | Discharge status: Home / Self Care | Source: Ambulatory Visit | Attending: Internal Medicine | Admitting: Internal Medicine

## 2023-06-21 ENCOUNTER — Encounter: Payer: Self-pay | Admitting: Internal Medicine

## 2023-06-21 VITALS — BP 104/75 | HR 84 | Ht 71.0 in | Wt 175.2 lb

## 2023-06-21 DIAGNOSIS — R3911 Hesitancy of micturition: Secondary | ICD-10-CM

## 2023-06-21 DIAGNOSIS — J90 Pleural effusion, not elsewhere classified: Secondary | ICD-10-CM | POA: Diagnosis not present

## 2023-06-21 DIAGNOSIS — R0609 Other forms of dyspnea: Secondary | ICD-10-CM | POA: Insufficient documentation

## 2023-06-21 NOTE — Patient Instructions (Addendum)
 My office will be contacting you by phone for referral to Urology   - if you don't hear back from my office within one week please call us back or notify us thru MyChart and we'll address it right away.   Duoneb (nebulizer - ipratropium / albuterol)  4 x daily and twice daily add budesonide 0.25 mg - call if you don't have these   Please remember to go to the lab department   for your tests - we will call you with the results when they are available.      Please remember to go to the  x-ray department  @  [facility] for your tests - we will call you with the results when they are available  The key is to stop smoking completely before smoking completely stops you!        Please schedule a follow up office visit in 6 weeks, call sooner if needed with all medications /inhalers/ solutions in hand so we can verify exactly what you are taking. This includes all medications from all doctors and over the counters

## 2023-06-21 NOTE — Progress Notes (Unsigned)
 Tommy Knight, male    DOB: [birth year]    MRN: [id number]   Brief patient profile:  53  yowm  active smoker with onset allergies 60s mostly allergies in spring = rhinitis and then sev years later started needing nebulizer  referred to pulmonary clinic in [place]  01/31/2023 by Tommy Knight who is treating him for Aortic stenosis since around 11/2022 and admitted with decompensation   - CT chest 12/15/22 Mild paraseptal emphysema. - PFTs requested from [facility] practice 01/31/2023 >>> never returned/ multiple requests     History of Present Illness  01/31/2023  Pulmonary/ 1st Knight eval/ Tommy Knight / [facility] off acei x early September 2024   Chief Complaint  Patient presents with   Establish Care   Shortness of Breath  Dyspnea:  shop and back 100 ft slt hills stops half way both ways  Cough: brown mucus worse all day long  Sleep: < 30 degrees electric bed  1 pillow  SABA use: neb bid/ hfa all the day  02: none Rec Plan A = Automatic = Always=   stop advair and start  Breztri Take 2 puffs first thing in am and then another 2 puffs about 12 hours later.   Work on inhaler technique:  >>>  Remember how golfers warm up by taking practice swings - do this with an empty inhaler  Plan B = Backup (to supplement plan A, not to replace it) Only use your albuterol inhaler as a rescue medication Plan C = Crisis (instead of Plan B but only if Plan B stops working) - only use your albuterol nebulizer if you first try Plan B   The key is to stop smoking completely before smoking completely stops you! Omeprazole 20 mg change to take x 2 pills   x   30-60 min before first meal of the day and Pepcid (famotidine)  20 mg after supper until return to Knight GERD diet reviewed, bed blocks rec    Please schedule a follow up Knight visit in 6 weeks, call sooner if needed with all medications /inhalers/ solutions in hand     03/14/2023  f/u ov/Tommy Knight/Tommy Knight re: AB/ COPD GOLD ?  maint on  breztri did bring meds  Chief Complaint  Patient presents with   Follow-up    Follow up   Dyspnea:  no change doe to house from shop - uphill to house but stops half way both ways (turns out it's his legs that give out before his sob)   Cough: white mucus some worse in am and sporadic daytime > noct  Sleeping: 30 degrees   SABA use: up 3 x daily / neb rarely  02: none  Rec Plan A = Automatic = Always=    Advair 250/50 one first thing in am and 12 hours laters  -  - smooth deep breath, hold it 5 seconds Plan B = Backup (to supplement plan A, not to replace it) Only use your albuterol inhaler as a rescue medication  Plan C = Crisis (instead of Plan B but only if Plan B stops working) - only use your albuterol nebulizer if you first try Plan B and it fails to help > ok to use the nebulizer up to every 4 hours but if start needing it regularly call for immediate appointment Also  Ok to try albuterol 15 min before an activity (on alternating days with inhaler vs the nebulizer vs nothing )  that you know would usually make  you short of breath    Please schedule a follow up visit in 3 months but call sooner if needed  with all medications /inhalers/ solutions in hand so we can verify exactly what you are taking. This includes all medications from all doctors and over the counters with PFTs on return.       05/26/2023  f/u ov/Tommy Knight/Tommy Knight re: doe/cough  maint on advair 250 / 50   did not  bring meds as rec  Chief Complaint  Patient presents with   Follow-up    3 month follow up   Dyspnea:  limited by calf pain "circulation/ nothing can be done"  Cough: worse at hs but settles over first   hour x years - no better on advair or breztri Sleeping: no    resp cc @ 30 degrees hob             SABA use: neb bid and prn saba  02: none Rec  I would be happy to review PFTs  from  [facility] if you can acquire them > just bring them with you next visit  Try off advair disc as it may be  contributing to cough  Add budesonide 0.25 mg  twice daily (no more than that)  with your nebulizer  duoneb.  Please schedule a follow up visit in 3 months but call sooner if needed with PFTs on return if possible    06/21/2023  f/u ov/Tommy Knight/Tommy Knight re: doe / chf  maint on ? Very confused with meds  Chief Complaint  Patient presents with   Shortness of Breath  Dyspnea:  worse so now riding scooter for grocery shopping  Cough: none  Sleeping: poorly but no resp cc  SABA use: sev times a week  02: none     No obvious day to day or daytime variability or assoc excess/ purulent sputum or mucus plugs or hemoptysis or cp or chest tightness, subjective wheeze or overt  hb symptoms.    Also denies any obvious fluctuation of symptoms with weather or environmental changes or other aggravating or alleviating factors except as outlined above   No unusual exposure hx or h/o childhood pna/ asthma or knowledge of premature birth.  Current Allergies, Complete Past Medical History, Past Surgical History, Family History, and Social History were reviewed in Tommy Knight record.  ROS  The following are not active complaints unless bolded Hoarseness, sore throat, dysphagia, dental problems, itching, sneezing,  nasal congestion or discharge of excess mucus or purulent secretions, ear ache,   fever, chills, sweats, unintended wt loss or wt gain, classically pleuritic or exertional cp,  orthopnea pnd or arm/hand swelling  or leg swelling, presyncope, palpitations, abdominal pain, anorexia, nausea, vomiting, diarrhea  or change in bowel habits or change in bladder habits, change in stools or change in urine, dysuria, hematuria,  rash, arthralgias, visual complaints, headache, numbness, weakness or ataxia or problems with walking or coordination,  change in mood or  memory.        Current Meds  Medication Sig   albuterol (VENTOLIN HFA) 108 (90 Base) MCG/ACT inhaler Inhale 2 puffs  into the lungs every 2 (two) hours as needed for wheezing or shortness of breath.   aspirin EC 81 MG tablet Take 1 tablet (81 mg total) by mouth at bedtime. Swallow whole.   atorvastatin (LIPITOR) 40 MG tablet Take 40 mg by mouth every evening.   budesonide (PULMICORT) 0.25 MG/2ML nebulizer solution One twice daily  with duoneb   clopidogrel (PLAVIX) 75 MG tablet Take 1 tablet (75 mg total) by mouth daily.   famotidine (PEPCID) 20 MG tablet One after supper (Patient taking differently: Take 20 mg by mouth daily.)   furosemide (LASIX) 40 MG tablet Take 1 tablet (40 mg total) by mouth 2 (two) times daily. Thereafter take 40 mg daily as needed for weight gain greater than 5 pounds in 2 days.   ipratropium-albuterol (DUONEB) 0.5-2.5 (3) MG/3ML SOLN Take 3 mLs by nebulization every 6 (six) hours as needed (for shortness of breath).   levocetirizine (XYZAL) 5 MG tablet Take 5 mg by mouth every evening.   montelukast (SINGULAIR) 10 MG tablet Take 10 mg by mouth every evening.   Multiple Vitamins-Minerals (CENTRUM SILVER 50+MEN) TABS Take 1 tablet by mouth daily.   Multiple Vitamins-Minerals (PRESERVISION AREDS 2) CAPS Take 1 capsule by mouth 2 (two) times daily.   omeprazole (PRILOSEC) 20 MG capsule Take 20 mg by mouth daily.             Past Medical History:  Diagnosis Date   Allergic rhinitis    Asthma    Carotid artery disease (HCC)    COPD (chronic obstructive pulmonary disease) (HCC)    Hyperlipidemia    Hypertension    Stenosis of left subclavian artery (HCC)    Stroke (HCC)       Objective:    Wts  06/21/2023         175  05/26/2023       177   03/14/23 190 lb 6.4 oz (86.4 kg)  02/08/23 189 lb 3.2 oz (85.8 kg)  01/31/23 189 lb (85.7 kg)     Vital signs reviewed  06/21/2023  - Note at rest 02 sats  90% on RA   General appearance:    chronically ill amb elderly wm nad at rest   HEENT :  Oropharynx  clear/ edentulous  Nasal turbinates nl    NECK :  without JVD/Nodes/TM/ nl  carotid upstrokes bilaterally   LUNGS: no acc muscle use,  Mod barrel  contour chest wall with bilateral  Distant bs  esp L base with dullness but  without cough on insp or exp maneuvers and mod  Hyperresonant  to  percussion bilaterally     CV:  RRR  no s3 with 2-3/6 sem s increase in P2, and no edema   ABD:  soft and nontender   MS:   Ext warm without deformities or   obvious joint restrictions , calf tenderness, cyanosis or clubbing  SKIN: warm and dry without lesions    NEURO:  alert, approp, nl sensorium with  no motor or cerebellar deficits apparent.           CXR PA and Lateral:   06/21/2023 :    I personally reviewed images and impression is as follows:     Mod L effusion can't exclude LLL pna      Assessment

## 2023-06-22 ENCOUNTER — Ambulatory Visit (HOSPITAL_COMMUNITY)
Admission: RE | Admit: 2023-06-22 | Discharge: 2023-06-22 | Disposition: A | Discharge status: Home / Self Care | Source: Ambulatory Visit | Attending: Urology

## 2023-06-22 ENCOUNTER — Ambulatory Visit (HOSPITAL_COMMUNITY)
Admission: RE | Admit: 2023-06-22 | Discharge: 2023-06-22 | Disposition: A | Discharge status: Home / Self Care | Source: Ambulatory Visit | Attending: Internal Medicine | Admitting: Internal Medicine

## 2023-06-22 DIAGNOSIS — F17200 Nicotine dependence, unspecified, uncomplicated: Secondary | ICD-10-CM | POA: Insufficient documentation

## 2023-06-22 DIAGNOSIS — J90 Pleural effusion, not elsewhere classified: Secondary | ICD-10-CM | POA: Insufficient documentation

## 2023-06-22 DIAGNOSIS — R0609 Other forms of dyspnea: Secondary | ICD-10-CM | POA: Insufficient documentation

## 2023-06-22 LAB — BODY FLUID CELL COUNT WITH DIFFERENTIAL
Eos, Fluid: 2 %
Lymphs, Fluid: 80 %
Monocyte-Macrophage-Serous Fluid: 13 % — ABNORMAL LOW (ref 50–90)
Neutrophil Count, Fluid: 5 % (ref 0–25)
Total Nucleated Cell Count, Fluid: 414 uL (ref 0–1000)

## 2023-06-22 LAB — PROTEIN, PLEURAL OR PERITONEAL FLUID: Total protein, fluid: <3 g/dL

## 2023-06-22 LAB — GLUCOSE, PLEURAL OR PERITONEAL FLUID: Glucose, Fluid: 119 mg/dL

## 2023-06-22 LAB — LACTATE DEHYDROGENASE, PLEURAL OR PERITONEAL FLUID: LD, Fluid: 72 U/L — ABNORMAL HIGH (ref 3–23)

## 2023-06-22 MED ORDER — LIDOCAINE HCL 1 % IJ SOLN
INTRAMUSCULAR | Status: AC
  Filled 2023-06-22: qty 20

## 2023-06-22 NOTE — Procedures (Signed)
 PROCEDURE SUMMARY:  Successful image-guided left thoracentesis. Yielded 1.5 liters of clear, red-tinged pleural fluid. Patient tolerated procedure well. EBL: Zero No immediate complications.  Specimen was sent for labs.  Post procedure CXR shows no pneumothorax.    Please see imaging section of Epic for full dictation.  Bing Neighbors Iver Miklas PA-C 06/22/2023 2:32 PM

## 2023-06-22 NOTE — Assessment & Plan Note (Addendum)
 Referred to urology  06/21/2023 >>>>  >>> avoid lama here but should be ok on sama

## 2023-06-22 NOTE — Assessment & Plan Note (Addendum)
 Dx 06/21/2023 > rec expedient L tcentesis 06/22/2023 or admit   Most likely parapneumonic as did have fever/ st one week prior but no cough or cp on 06/21/2023   >>>  ordered t centesis 06/22/2023 / doe labs pending/ pfts still not done yet     Discussed in detail all the  indications, usual  risks and alternatives  relative to the benefits with patient who agrees to proceed with w/u as outlined.            Each maintenance medication was reviewed in detail including emphasizing most importantly the difference between maintenance and prns and under what circumstances the prns are to be triggered using an action plan format where appropriate.  Total time for H and P, chart review, counseling, reviewing neb device(s) and generating customized AVS unique to this office visit / same day charting = 40  min for multiple  refractory respiratory  symptoms and now new dx of L effusion of ? origin

## 2023-06-22 NOTE — Assessment & Plan Note (Addendum)
 Active smoker -  Echo 11/29/22  1. Left ventricular ejection fraction, by estimation, is 25 to 30%. The  left ventricle has severely decreased function. The left ventricle  demonstrates global hypokinesis. Left ventricular diastolic parameters are  consistent with Grade III diastolic  dysfunction (restrictive). Elevated left atrial pressure.   2. Right ventricular systolic function is mildly reduced. The right  ventricular size is normal. Tricuspid regurgitation signal is inadequate  for assessing PA pressure.   3. Left atrial size was severely dilated.   4. Right atrial size was mildly dilated.   5. The mitral valve is abnormal. Trivial mitral valve regurgitation. No  evidence of mitral stenosis. Moderate mitral annular calcification.   6. The aortic valve was not well visualized but there appears to be  severe restriction of the aortic valve leaflet opening. There is severe  calcifcation of the aortic valve. Aortic valve regurgitation is not  visualized. Aortic valve mean gradient  measures 28.0 mmHg. Aortic valve Vmax measures 3.39 m/s. DVI is 0.21.  Overall, this is likely severe aortic valve stenosis, low flow-low  gradient in the setting of severely reduced LVEF.   7. The inferior vena cava is dilated in size with >50% respiratory  variability, suggesting right atrial pressure of 8 mmHg.  - RHC/LHC  12/01/22 1.  Mild to moderate nonobstructive coronary artery disease. 2.  Severe bilateral common iliac stenoses, common femoral stenoses, and subclavian stenoses. 3.  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 - PFTs requested from [facility] 01/31/2023 and again 03/14/2023  - 01/31/2023  After extensive coaching inhaler device,  effectiveness =    60% > breztri trial  - 01/31/2023   Walked on RA  x  2  lap(s) =  approx 300  ft  @ mod /cane pace, stopped due to knee pain  with lowest 02 sats 94%   - 03/14/2023   Walked on RA  x  3  lap(s) =  approx 450  ft  @ slow  pace, stopped due to end of study, legs sore,  with lowest 02 sats 93%   >>>  New L pleural effusion 06/21/2023  see sep a/p - in meantime change resp rx wit duoneb qid with budesonide 0.5 mg bid

## 2023-06-24 LAB — CYTOLOGY - NON PAP

## 2023-06-26 LAB — BODY FLUID CULTURE W GRAM STAIN
Culture: NO GROWTH
Gram Stain: NONE SEEN

## 2023-06-28 LAB — BASIC METABOLIC PANEL WITH GFR
BUN/Creatinine Ratio: 22 (ref 10–24)
BUN: 34 mg/dL — ABNORMAL HIGH (ref 8–27)
CO2: 26 mmol/L (ref 20–29)
Calcium: 9 mg/dL (ref 8.6–10.2)
Chloride: 101 mmol/L (ref 96–106)
Creatinine, Ser: 1.52 mg/dL — ABNORMAL HIGH (ref 0.76–1.27)
Glucose: 109 mg/dL — ABNORMAL HIGH (ref 70–99)
Potassium: 4.2 mmol/L (ref 3.5–5.2)
Sodium: 143 mmol/L (ref 134–144)
eGFR: 47 mL/min/{1.73_m2} — ABNORMAL LOW (ref >59)

## 2023-06-28 LAB — CBC WITH DIFFERENTIAL/PLATELET
Basophils Absolute: 0.1 10*3/uL (ref 0.0–0.2)
Basos: 1 %
EOS (ABSOLUTE): 0.2 10*3/uL (ref 0.0–0.4)
Eos: 3 %
Hematocrit: 39.7 % (ref 37.5–51.0)
Hemoglobin: 13 g/dL (ref 13.0–17.7)
Immature Grans (Abs): 0 10*3/uL (ref 0.0–0.1)
Immature Granulocytes: 0 %
Lymphocytes Absolute: 1.1 10*3/uL (ref 0.7–3.1)
Lymphs: 13 %
MCH: 27.1 pg (ref 26.6–33.0)
MCHC: 32.7 g/dL (ref 31.5–35.7)
MCV: 83 fL (ref 79–97)
Monocytes Absolute: 0.7 10*3/uL (ref 0.1–0.9)
Monocytes: 8 %
Neutrophils Absolute: 5.9 10*3/uL (ref 1.4–7.0)
Neutrophils: 75 %
Platelets: 297 10*3/uL (ref 150–450)
RBC: 4.79 x10E6/uL (ref 4.14–5.80)
RDW: 13.3 % (ref 11.6–15.4)
WBC: 8 10*3/uL (ref 3.4–10.8)

## 2023-06-28 LAB — BRAIN NATRIURETIC PEPTIDE: BNP: >5000 pg/mL — AB (ref 0.0–100.0)

## 2023-06-28 LAB — ALPHA-1-ANTITRYPSIN PHENOTYP: A-1 Antitrypsin: 182 mg/dL (ref 101–187)

## 2023-06-30 ENCOUNTER — Other Ambulatory Visit: Payer: Self-pay | Admitting: Internal Medicine

## 2023-06-30 DIAGNOSIS — J90 Pleural effusion, not elsewhere classified: Secondary | ICD-10-CM

## 2023-06-30 NOTE — Progress Notes (Signed)
 Spoke with the pt and notified of results/recs  He verbalized understanding  Appt with MW for 07/19/23 and cxr at [facility] was ordered  He is aware to get CXR prior to visit

## 2023-06-30 NOTE — Progress Notes (Signed)
 Pt aware of results

## 2023-07-01 ENCOUNTER — Telehealth: Payer: Self-pay | Admitting: Internal Medicine

## 2023-07-01 NOTE — Telephone Encounter (Signed)
 Returned call to wife. Wife states that she spoke with someone concerning pt on yesterday. Informed wife that we do not have documentation of a phone call from [facility] on yesterday. Encouraged wife to reach out to pulmonology or PCP office.

## 2023-07-01 NOTE — Telephone Encounter (Signed)
 Wife Judie Noun) stated she is returning staff call.

## 2023-07-18 NOTE — Progress Notes (Deleted)
 Tommy Knight, male    DOB: 1945/06/19    MRN: [id number]   Brief patient profile:  89  yowm  active smoker with onset allergies 60s mostly allergies in spring = rhinitis and then sev years later started needing nebulizer  referred to pulmonary clinic in [place]  01/31/2023 by [name] who is treating him for Aortic stenosis since around 11/2022 and admitted with decompensation   - CT chest 12/15/22 Mild paraseptal emphysema. - PFTs requested from [facility] practice 01/31/2023 >>> never returned/ multiple requests     History of Present Illness  01/31/2023  Pulmonary/ 1st office eval/ [name] / [facility] off acei x early September 2024   Chief Complaint  Patient presents with   Establish Care   Shortness of Breath  Dyspnea:  shop and back 100 ft slt hills stops half way both ways  Cough: brown mucus worse all day long  Sleep: < 30 degrees electric bed  1 pillow  SABA use: neb bid/ hfa all the day  02: none Rec Plan A = Automatic = Always=   stop advair and start  Breztri  Take 2 puffs first thing in am and then another 2 puffs about 12 hours later.   Work on inhaler technique:  >>>  Remember how golfers warm up by taking practice swings - do this with an empty inhaler  Plan B = Backup (to supplement plan A, not to replace it) Only use your albuterol  inhaler as a rescue medication Plan C = Crisis (instead of Plan B but only if Plan B stops working) - only use your albuterol  nebulizer if you first try Plan B   The key is to stop smoking completely before smoking completely stops you! Omeprazole 20 mg change to take x 2 pills   x   30-60 min before first meal of the day and Pepcid  (famotidine )  20 mg after supper until return to office GERD diet reviewed, bed blocks rec    Please schedule a follow up office visit in 6 weeks, call sooner if needed with all medications /inhalers/ solutions in hand     03/14/2023  f/u ov/[facility] office/Damean Poffenberger re: AB/ COPD GOLD ?  maint on  breztri  did bring meds  Chief Complaint  Patient presents with   Follow-up    Follow up   Dyspnea:  no change doe to house from shop - uphill to house but stops half way both ways (turns out it's his legs that give out before his sob)   Cough: white mucus some worse in am and sporadic daytime > noct  Sleeping: 30 degrees   SABA use: up 3 x daily / neb rarely  02: none  Rec Plan A = Automatic = Always=    Advair 250/50 one first thing in am and 12 hours laters  -  - smooth deep breath, hold it 5 seconds Plan B = Backup (to supplement plan A, not to replace it) Only use your albuterol  inhaler as a rescue medication  Plan C = Crisis (instead of Plan B but only if Plan B stops working) - only use your albuterol  nebulizer if you first try Plan B and it fails to help > ok to use the nebulizer up to every 4 hours but if start needing it regularly call for immediate appointment Also  Ok to try albuterol  15 min before an activity (on alternating days with inhaler vs the nebulizer vs nothing )  that you know would usually make  you short of breath    Please schedule a follow up visit in 3 months but call sooner if needed  with all medications /inhalers/ solutions in hand so we can verify exactly what you are taking. This includes all medications from all doctors and over the counters with PFTs on return.       05/26/2023  f/u ov/[place] office/Dennis Killilea re: doe/cough  maint on advair 250 / 50   did not  bring meds as rec  Chief Complaint  Patient presents with   Follow-up    3 month follow up   Dyspnea:  limited by calf pain "circulation/ nothing can be done"  Cough: worse at hs but settles over first   hour x years - no better on advair or breztri  Sleeping: no    resp cc @ 30 degrees hob             SABA use: neb bid and prn saba  02: none Rec  I would be happy to review PFTs  from  [facility] if you can acquire them > just bring them with you next visit  Try off advair disc as it may be  contributing to cough  Add budesonide  0.25 mg  twice daily (no more than that)  with your nebulizer  duoneb.  Please schedule a follow up visit in 3 months but call sooner if needed with PFTs on return if possible    06/21/2023  f/u ov/[place] office/Elida Harbin re: doe / chf  maint on ? Very confused with meds  Chief Complaint  Patient presents with   Shortness of Breath  Dyspnea:  worse so now riding scooter for grocery shopping  Cough: none  Sleeping: poorly but no resp cc  SABA use: sev times a week  02: none  Rec    07/19/2023  f/u ov/[place] office/Rob Mciver re: *** maint on ***  No chief complaint on file.   Dyspnea:  *** Cough: *** Sleeping: ***   resp cc  SABA use: *** 02: ***  Lung cancer screening: ***   No obvious day to day or daytime variability or assoc excess/ purulent sputum or mucus plugs or hemoptysis or cp or chest tightness, subjective wheeze or overt sinus or hb symptoms.    Also denies any obvious fluctuation of symptoms with weather or environmental changes or other aggravating or alleviating factors except as outlined above   No unusual exposure hx or h/o childhood pna/ asthma or knowledge of premature birth.  Current Allergies, Complete Past Medical History, Past Surgical History, Family History, and Social History were reviewed in [employer] record.  ROS  The following are not active complaints unless bolded Hoarseness, sore throat, dysphagia, dental problems, itching, sneezing,  nasal congestion or discharge of excess mucus or purulent secretions, ear ache,   fever, chills, sweats, unintended wt loss or wt gain, classically pleuritic or exertional cp,  orthopnea pnd or arm/hand swelling  or leg swelling, presyncope, palpitations, abdominal pain, anorexia, nausea, vomiting, diarrhea  or change in bowel habits or change in bladder habits, change in stools or change in urine, dysuria, hematuria,  rash, arthralgias, visual complaints,  headache, numbness, weakness or ataxia or problems with walking or coordination,  change in mood or  memory.        No outpatient medications have been marked as taking for the 07/19/23 encounter (Appointment) with Adel Burch B, MD.               Past  Medical History:  Diagnosis Date   Allergic rhinitis    Asthma    Carotid artery disease (HCC)    COPD (chronic obstructive pulmonary disease) (HCC)    Hyperlipidemia    Hypertension    Stenosis of left subclavian artery (HCC)    Stroke (HCC)       Objective:    Wts  07/19/2023          ***  06/21/2023         175  05/26/2023       177   03/14/23 190 lb 6.4 oz (86.4 kg)  02/08/23 189 lb 3.2 oz (85.8 kg)  01/31/23 189 lb (85.7 kg)   Vital signs reviewed  07/19/2023  - Note at rest 02 sats  ***% on ***   General appearance:    ***  Mod barr 2-3/6 sem  ***         CXR PA and Lateral:   06/21/2023 :    I personally reviewed images and impression is as follows:     Mod L effusion can't exclude LLL pna      Assessment

## 2023-07-19 ENCOUNTER — Ambulatory Visit: Admitting: Internal Medicine

## 2023-07-22 ENCOUNTER — Ambulatory Visit: Admitting: Urology

## 2023-07-22 ENCOUNTER — Encounter: Payer: Self-pay | Admitting: Urology

## 2023-07-22 VITALS — BP 106/75 | HR 85

## 2023-07-22 DIAGNOSIS — R3911 Hesitancy of micturition: Secondary | ICD-10-CM

## 2023-07-22 DIAGNOSIS — N401 Enlarged prostate with lower urinary tract symptoms: Secondary | ICD-10-CM | POA: Diagnosis not present

## 2023-07-22 DIAGNOSIS — R3129 Other microscopic hematuria: Secondary | ICD-10-CM

## 2023-07-22 DIAGNOSIS — N138 Other obstructive and reflux uropathy: Secondary | ICD-10-CM

## 2023-07-22 DIAGNOSIS — F17200 Nicotine dependence, unspecified, uncomplicated: Secondary | ICD-10-CM | POA: Diagnosis not present

## 2023-07-22 LAB — URINALYSIS, ROUTINE W REFLEX MICROSCOPIC
Bilirubin, UA: NEGATIVE
Glucose, UA: NEGATIVE
Ketones, UA: NEGATIVE
Leukocytes,UA: NEGATIVE
Nitrite, UA: NEGATIVE
Specific Gravity, UA: 1.02 (ref 1.005–1.030)
Urobilinogen, Ur: 1 mg/dL (ref 0.2–1.0)
pH, UA: 6 (ref 5.0–7.5)

## 2023-07-22 LAB — MICROSCOPIC EXAMINATION
Bacteria, UA: NONE SEEN
WBC, UA: NONE SEEN /HPF (ref 0–5)

## 2023-07-22 LAB — BLADDER SCAN AMB NON-IMAGING: Scan Result: 4

## 2023-07-22 NOTE — Progress Notes (Signed)
 Name: Tommy Knight DOB: 1945/07/08 MRN: [id number]  History of Present Illness: Tommy Knight is a 78 y.o. male who presents today as a new patient at [facility] [place]. All available relevant medical records have been reviewed. He is accompanied by his wife Judie Noun and daughter Urban Garden. GU history: 1. BPH with LUTS (urinary hesitancy).  Per chart review: > 12/15/2022: CT angio abdomen/pelvis w/o contrast showed: "No hydronephrosis or nephrolithiasis. Small bilateral low-attenuation renal lesions which are similar to accurately characterize but likely simple cysts, no specific follow-up imaging is necessary. Mild bladder wall thickening."  > 06/21/2023: GFR 47; creatinine 1.52.  Today: He reports weak dribbling urinary stream, nocturia x0-1, mild hesitancy, occasional straining to void, and sensations of incomplete emptying. Denies urinary urgency, frequency, dysuria, gross hematuria. He denies flank pain or abdominal pain.  Reports he previously took Flomax years ago but stopped it because it wasn't helpful. He denies prior GU surgery; reports prior cystoscopy.    Medications: Current Outpatient Medications  Medication Sig Dispense Refill   albuterol  (VENTOLIN  HFA) 108 (90 Base) MCG/ACT inhaler Inhale 2 puffs into the lungs every 2 (two) hours as needed for wheezing or shortness of breath.     aspirin  EC 81 MG tablet Take 1 tablet (81 mg total) by mouth at bedtime. Swallow whole. 30 tablet 12   atorvastatin  (LIPITOR) 40 MG tablet Take 40 mg by mouth every evening.     budesonide  (PULMICORT ) 0.25 MG/2ML nebulizer solution One twice daily with duoneb 60 mL 12   clopidogrel  (PLAVIX ) 75 MG tablet Take 1 tablet (75 mg total) by mouth daily.     famotidine  (PEPCID ) 20 MG tablet One after supper (Patient taking differently: Take 20 mg by mouth daily.) 30 tablet 11   furosemide  (LASIX ) 40 MG tablet Take 1 tablet (40 mg total) by mouth 2 (two) times daily. Thereafter take 40 mg daily  as needed for weight gain greater than 5 pounds in 2 days. 60 tablet 11   ipratropium-albuterol  (DUONEB) 0.5-2.5 (3) MG/3ML SOLN Take 3 mLs by nebulization every 6 (six) hours as needed (for shortness of breath).     levocetirizine (XYZAL) 5 MG tablet Take 5 mg by mouth every evening.     montelukast  (SINGULAIR ) 10 MG tablet Take 10 mg by mouth every evening.     Multiple Vitamins-Minerals (CENTRUM SILVER 50+MEN) TABS Take 1 tablet by mouth daily.     Multiple Vitamins-Minerals (PRESERVISION AREDS 2) CAPS Take 1 capsule by mouth 2 (two) times daily.     omeprazole (PRILOSEC) 20 MG capsule Take 20 mg by mouth daily.     No current facility-administered medications for this visit.    Allergies: Allergies  Allergen Reactions   Bee Pollen Other (See Comments)    Seasonal allergies Trees     Past Medical History:  Diagnosis Date   Allergic rhinitis    Asthma    Carotid artery disease (HCC)    COPD (chronic obstructive pulmonary disease) (HCC)    Hyperlipidemia    Hypertension    Stenosis of left subclavian artery (HCC)    Stroke [facility])    Past Surgical History:  Procedure Laterality Date   APPENDECTOMY     RIGHT/LEFT HEART CATH AND CORONARY ANGIOGRAPHY N/A 12/02/2022   Procedure: RIGHT/LEFT HEART CATH AND CORONARY ANGIOGRAPHY;  Surgeon: Kyra Phy, MD;  Location: [facility] INVASIVE CV LAB;  Service: Cardiovascular;  Laterality: N/A;   Family History  Problem Relation Age of Onset   Heart disease  Other        multiple family members he's not sure   Social History   Socioeconomic History   Marital status: Married    Spouse name: Not on file   Number of children: Not on file   Years of education: Not on file   Highest education level: Not on file  Occupational History   Not on file  Tobacco Use   Smoking status: Every Day   Smokeless tobacco: Never  Vaping Use   Vaping status: Never Used  Substance and Sexual Activity   Alcohol use: Yes    Comment: 1-3 beers per day    Drug use: Never   Sexual activity: Not on file  Other Topics Concern   Not on file  Social History Narrative   Not on file   Social Drivers of Health   Financial Resource Strain: Not on file  Food Insecurity: No Food Insecurity (05/02/2023)   Hunger Vital Sign    Worried About Running Out of Food in the Last Year: Never true    Ran Out of Food in the Last Year: Never true  Transportation Needs: No Transportation Needs (05/02/2023)   PRAPARE - Administrator, Civil Service (Medical): No    Lack of Transportation (Non-Medical): No  Physical Activity: Not on file  Stress: Not on file  Social Connections: Unknown (05/02/2023)   Social Connection and Isolation Panel [NHANES]    Frequency of Communication with Friends and Family: More than three times a week    Frequency of Social Gatherings with Friends and Family: Twice a week    Attends Religious Services: Patient declined    Database administrator or Organizations: Patient declined    Attends Banker Meetings: Never    Marital Status: Married  Catering manager Violence: Not At Risk (05/02/2023)   Humiliation, Afraid, Rape, and Kick questionnaire    Fear of Current or Ex-Partner: No    Emotionally Abused: No    Physically Abused: No    Sexually Abused: No    SUBJECTIVE  Review of Systems Constitutional: Patient denies any unintentional weight loss or change in strength lntegumentary: Patient denies any rashes or pruritus Cardiovascular: Patient denies chest pain or syncope Respiratory: Patient denies shortness of breath Gastrointestinal: Patient denies constipation Musculoskeletal: Patient denies muscle cramps or weakness Neurologic: Patient denies convulsions or seizures Allergic/Immunologic: Patient denies recent allergic reaction(s) Hematologic/Lymphatic: Patient denies bleeding tendencies Endocrine: Patient denies heat/cold intolerance  GU: As per HPI.  OBJECTIVE Vitals:   07/22/23 1120  BP:  106/75  Pulse: 85   There is no height or weight on file to calculate BMI.  Physical Examination Constitutional: No obvious distress; patient is non-toxic appearing  Cardiovascular: No visible lower extremity edema.  Respiratory: The patient does not have audible wheezing/stridor; respirations do not appear labored  Gastrointestinal: Abdomen non-distended Musculoskeletal: Normal ROM of UEs  Skin: No obvious rashes/open sores  Neurologic: CN 2-12 grossly intact Psychiatric: Answered questions appropriately with normal affect  Hematologic/Lymphatic/Immunologic: No obvious bruises or sites of spontaneous bleeding  Urine microscopy: 3-10 RBC/hpf, no WBC or bacteria. Protein 3+. PVR: 7 ml  ASSESSMENT Urinary hesitancy - Plan: Urinalysis, Routine w reflex microscopic, BLADDER SCAN AMB NON-IMAGING  Benign prostatic hyperplasia with urinary obstruction  Microscopic hematuria  Smoker  We agreed to avoid alpha blockers due to hypotension. Advised cystoscopy to further evaluate BPH/BOO and microscopic hematuria. Patient verbalized understanding of and agreement with current plan. All questions were answered.  PLAN Advised the following: Return for 1st available cystoscopy with any urology MD.  Orders Placed This Encounter  Procedures   Urinalysis, Routine w reflex microscopic   BLADDER SCAN AMB NON-IMAGING    It has been explained that the patient is to follow regularly with their PCP in addition to all other providers involved in their care and to follow instructions provided by these respective offices. Patient advised to contact urology clinic if any urologic-pertaining questions, concerns, new symptoms or problems arise in the interim period.  There are no Patient Instructions on file for this visit.  Electronically signed by:  Lauretta Ponto, MSN, FNP-C, CUNP 07/22/2023 2:54 PM

## 2023-07-28 ENCOUNTER — Ambulatory Visit (HOSPITAL_COMMUNITY)
Admission: RE | Admit: 2023-07-28 | Discharge: 2023-07-28 | Disposition: A | Discharge status: Home / Self Care | Source: Ambulatory Visit | Attending: Internal Medicine | Admitting: Internal Medicine

## 2023-07-28 DIAGNOSIS — R0609 Other forms of dyspnea: Secondary | ICD-10-CM | POA: Diagnosis present

## 2023-07-28 DIAGNOSIS — F17210 Nicotine dependence, cigarettes, uncomplicated: Secondary | ICD-10-CM | POA: Insufficient documentation

## 2023-07-28 LAB — PULMONARY FUNCTION TEST
DL/VA % pred: 105 %
DL/VA: 4.03 ml/min/mmHg/L
DLCO unc % pred: 41 %
DLCO unc: 10.27 ml/min/mmHg
FEF 25-75 Post: 0.33 L/s
FEF 25-75 Pre: 0.62 L/s
FEF2575-%Change-Post: -46 %
FEF2575-%Pred-Post: 15 %
FEF2575-%Pred-Pre: 29 %
FEV1-%Change-Post: -7 %
FEV1-%Pred-Post: 34 %
FEV1-%Pred-Pre: 37 %
FEV1-Post: 1.05 L
FEV1-Pre: 1.14 L
FEV1FVC-%Change-Post: 4 %
FEV1FVC-%Pred-Pre: 90 %
FEV6-%Change-Post: -10 %
FEV6-%Pred-Post: 38 %
FEV6-%Pred-Pre: 43 %
FEV6-Post: 1.54 L
FEV6-Pre: 1.72 L
FEV6FVC-%Change-Post: 1 %
FEV6FVC-%Pred-Post: 106 %
FEV6FVC-%Pred-Pre: 104 %
FVC-%Change-Post: -11 %
FVC-%Pred-Post: 36 %
FVC-%Pred-Pre: 41 %
FVC-Post: 1.55 L
FVC-Pre: 1.75 L
Post FEV1/FVC ratio: 67 %
Post FEV6/FVC ratio: 100 %
Pre FEV1/FVC ratio: 65 %
Pre FEV6/FVC Ratio: 98 %
RV % pred: 95 %
RV: 2.58 L
TLC % pred: 56 %
TLC: 4.13 L

## 2023-07-28 MED ORDER — ALBUTEROL SULFATE (2.5 MG/3ML) 0.083% IN NEBU
2.5000 mg | INHALATION_SOLUTION | Freq: Once | RESPIRATORY_TRACT | Status: AC
  Administered 2023-07-28: 2.5 mg via RESPIRATORY_TRACT

## 2023-07-31 ENCOUNTER — Ambulatory Visit: Payer: Self-pay | Admitting: Internal Medicine

## 2023-08-01 ENCOUNTER — Ambulatory Visit: Admitting: Internal Medicine

## 2023-08-02 ENCOUNTER — Ambulatory Visit: Admitting: Internal Medicine

## 2023-08-07 NOTE — Progress Notes (Unsigned)
 Tommy Knight, male    DOB: June 30, 1945    MRN: [id number]   Brief patient profile:  20  yowm  active smoker/MM with onset allergies 60s mostly allergies in spring = rhinitis and then sev years later started needing nebulizer  referred to pulmonary clinic in [place]  01/31/2023 by [name] who is treating him for Aortic stenosis since around 11/2022 and admitted with decompensation   - CT chest 12/15/22 Mild paraseptal emphysema. - PFTs requested from [facility] practice 01/31/2023 >>> never returned/ multiple requests     History of Present Illness  01/31/2023  Pulmonary/ 1st office eval/ [name] / [employer] Office off acei x early September 2024   Chief Complaint  Patient presents with   Establish Care   Shortness of Breath  Dyspnea:  shop and back 100 ft slt hills stops half way both ways  Cough: brown mucus worse all day long  Sleep: < 30 degrees electric bed  1 pillow  SABA use: neb bid/ hfa all the day  02: none Rec Plan A = Automatic = Always=   stop advair and start  Breztri  Take 2 puffs first thing in am and then another 2 puffs about 12 hours later.   Work on inhaler technique:  >>>  Remember how golfers warm up by taking practice swings - do this with an empty inhaler  Plan B = Backup (to supplement plan A, not to replace it) Only use your albuterol  inhaler as a rescue medication Plan C = Crisis (instead of Plan B but only if Plan B stops working) - only use your albuterol  nebulizer if you first try Plan B   The key is to stop smoking completely before smoking completely stops you! Omeprazole 20 mg change to take x 2 pills   x   30-60 min before first meal of the day and Pepcid  (famotidine )  20 mg after supper until return to office GERD diet reviewed, bed blocks rec    Please schedule a follow up office visit in 6 weeks, call sooner if needed with all medications /inhalers/ solutions in hand     03/14/2023  f/u ov/ office/[name] re: AB/ COPD GOLD ?  maint on  breztri  did bring meds  Chief Complaint  Patient presents with   Follow-up    Follow up   Dyspnea:  no change doe to house from shop - uphill to house but stops half way both ways (turns out it's his legs that give out before his sob)   Cough: white mucus some worse in am and sporadic daytime > noct  Sleeping: 30 degrees   SABA use: up 3 x daily / neb rarely  02: none  Rec Plan A = Automatic = Always=    Advair 250/50 one first thing in am and 12 hours laters  -  - smooth deep breath, hold it 5 seconds Plan B = Backup (to supplement plan A, not to replace it) Only use your albuterol  inhaler as a rescue medication  Plan C = Crisis (instead of Plan B but only if Plan B stops working) - only use your albuterol  nebulizer if you first try Plan B and it fails to help > ok to use the nebulizer up to every 4 hours but if start needing it regularly call for immediate appointment Also  Ok to try albuterol  15 min before an activity (on alternating days with inhaler vs the nebulizer vs nothing )  that you know would usually make  you short of breath    Please schedule a follow up visit in 3 months but call sooner if needed  with all medications /inhalers/ solutions in hand so we can verify exactly what you are taking. This includes all medications from all doctors and over the counters with PFTs on return.       05/26/2023  f/u ov/[place] office/[name] re: doe/cough  maint on advair 250 / 50   did not  bring meds as rec  Chief Complaint  Patient presents with   Follow-up    3 month follow up   Dyspnea:  limited by calf pain "circulation/ nothing can be done"  Cough: worse at hs but settles over first   hour x years - no better on advair or breztri  Sleeping: no    resp cc @ 30 degrees hob             SABA use: neb bid and prn saba  02: none Rec  I would be happy to review PFTs  from  [place] practice if you can acquire them > just bring them with you next visit  Try off advair disc as it may be  contributing to cough  Add budesonide  0.25 mg  twice daily (no more than that)  with your nebulizer  duoneb.  Please schedule a follow up visit in 3 months but call sooner if needed with PFTs on return if possible    06/21/2023  f/u ov/[place] office/[name] re: doe / chf  maint on ? Very confused with meds  Chief Complaint  Patient presents with   Shortness of Breath  Dyspnea:  worse so now riding scooter for grocery shopping  Cough: none  Sleeping: poorly but no resp cc  SABA use: sev times a week  02: none  Rec My office will be contacting you by phone for referral to Urology    Duoneb (nebulizer - ipratropium / albuterol )  4 x daily and twice daily add budesonide  0.25 mg - call if you don't have these  Please schedule a follow up office visit in 6 weeks, call sooner if needed with all medications /inhalers/ solutions in hand   - Alpha One AT  Phenotype  MM  level   182 - PFT's  07/28/23  FEV1 1.14 (37 % ) ratio 0.65  p 0 % improvement from saba p duoneb prior to study with DLCO  10.27 (41%)   and FV curve mild concavity    Dx 06/21/2023 > rec expedient L tcentesis    - L Tcentesis 06/22/2023   x 1.5 liters slt blood tinged  but transudate/nl glucose cytology >>>neg    08/10/2023  f/u ov/ office/[name] re: GOLD 3 COPD/  L effusion  maint on duoneb/bud 0.25 bid   Chief Complaint  Patient presents with   Shortness of Breath  Dyspnea:  room to  room - denies improvement p L Tcentesis  Cough:  not much  Sleeping: 45 degrees HOB s    resp cc  (no change baseline)  SABA use: duoneb bid  02: none    No obvious day to day or daytime variability or assoc excess/ purulent sputum or mucus plugs or hemoptysis or cp or chest tightness, subjective wheeze or overt   hb symptoms.    Also denies any obvious fluctuation of symptoms with weather or environmental changes or other aggravating or alleviating factors except as outlined above   No unusual exposure hx or h/o childhood pna/ asthma  or  knowledge of premature birth.  Current Allergies, Complete Past Medical History, Past Surgical History, Family History, and Social History were reviewed in [employer] record.  ROS  The following are not active complaints unless bolded Hoarseness, sore throat, dysphagia, dental problems, itching, sneezing,  nasal congestion or discharge of excess mucus or purulent secretions, ear ache,   fever, chills, sweats, unintended wt loss or wt gain, classically pleuritic or exertional cp,  orthopnea pnd or arm/hand swelling  or leg swelling, presyncope, palpitations, abdominal pain, anorexia, nausea, vomiting, diarrhea  or change in bowel habits or change in bladder habits, change in stools or change in urine, dysuria, hematuria,  rash, arthralgias, visual complaints, headache, numbness, weakness or ataxia or problems with walking or coordination,  change in mood or  memory.        Current Meds  Medication Sig   albuterol  (VENTOLIN  HFA) 108 (90 Base) MCG/ACT inhaler Inhale 2 puffs into the lungs every 2 (two) hours as needed for wheezing or shortness of breath.   aspirin  EC 81 MG tablet Take 1 tablet (81 mg total) by mouth at bedtime. Swallow whole.   atorvastatin  (LIPITOR) 40 MG tablet Take 40 mg by mouth every evening.   budesonide  (PULMICORT ) 0.25 MG/2ML nebulizer solution One twice daily with duoneb   clopidogrel  (PLAVIX ) 75 MG tablet Take 1 tablet (75 mg total) by mouth daily.   famotidine  (PEPCID ) 20 MG tablet One after supper (Patient taking differently: Take 20 mg by mouth daily.)   furosemide  (LASIX ) 40 MG tablet Take 1 tablet (40 mg total) by mouth 2 (two) times daily. Thereafter take 40 mg daily as needed for weight gain greater than 5 pounds in 2 days.   ipratropium-albuterol  (DUONEB) 0.5-2.5 (3) MG/3ML SOLN Take 3 mLs by nebulization every 6 (six) hours as needed (for shortness of breath).   levocetirizine (XYZAL) 5 MG tablet Take 5 mg by mouth every evening.    montelukast  (SINGULAIR ) 10 MG tablet Take 10 mg by mouth every evening.   Multiple Vitamins-Minerals (CENTRUM SILVER 50+MEN) TABS Take 1 tablet by mouth daily.   Multiple Vitamins-Minerals (PRESERVISION AREDS 2) CAPS Take 1 capsule by mouth 2 (two) times daily.   omeprazole (PRILOSEC) 20 MG capsule Take 20 mg by mouth daily.               Past Medical History:  Diagnosis Date   Allergic rhinitis    Asthma    Carotid artery disease (HCC)    COPD (chronic obstructive pulmonary disease) (HCC)    Hyperlipidemia    Hypertension    Stenosis of left subclavian artery (HCC)    Stroke (HCC)       Objective:    Wts  08/10/2023       165    06/21/2023         175  05/26/2023       177   03/14/23 190 lb 6.4 oz (86.4 kg)  02/08/23 189 lb 3.2 oz (85.8 kg)  01/31/23 189 lb (85.7 kg)   Vital signs reviewed  08/10/2023  - Note at rest 02 sats  96% on RA   General appearance:    w/c bound chronially ill nad    HEENT :  Oropharynx  clear   Nasal turbinates nl    NECK :  without JVD/Nodes/TM/ nl carotid upstrokes bilaterally   LUNGS: no acc muscle use,  Mod barrel  contour chest wall with  decreased bs/ dullness on L  and  without cough on insp or exp maneuvers and mod  Hyperresonant  to  percussion bilaterally     CV:  RRR  no s3  2-3/6 SEM with slt  increase in P2, and no edema   ABD:  soft and nontender with pos mid insp Hoover's  in the supine position. No bruits or organomegaly appreciated, bowel sounds nl  MS:   Ext warm without deformities or   obvious joint restrictions , calf tenderness, cyanosis or clubbing  SKIN: warm and dry without lesions    NEURO:  alert, approp, nl sensorium with  no motor or cerebellar deficits apparent.                 Assessment

## 2023-08-10 ENCOUNTER — Ambulatory Visit: Admitting: Internal Medicine

## 2023-08-10 ENCOUNTER — Encounter: Payer: Self-pay | Admitting: Internal Medicine

## 2023-08-10 VITALS — BP 98/66 | HR 85 | Ht 71.0 in | Wt 165.0 lb

## 2023-08-10 DIAGNOSIS — J90 Pleural effusion, not elsewhere classified: Secondary | ICD-10-CM

## 2023-08-10 DIAGNOSIS — J449 Chronic obstructive pulmonary disease, unspecified: Secondary | ICD-10-CM | POA: Diagnosis not present

## 2023-08-10 DIAGNOSIS — F17210 Nicotine dependence, cigarettes, uncomplicated: Secondary | ICD-10-CM | POA: Diagnosis not present

## 2023-08-10 DIAGNOSIS — R0609 Other forms of dyspnea: Secondary | ICD-10-CM

## 2023-08-10 NOTE — Assessment & Plan Note (Signed)
 Counseled re importance of smoking cessation but did not meet time criteria for separate billing      Each maintenance medication was reviewed in detail including emphasizing most importantly the difference between maintenance and prns and under what circumstances the prns are to be triggered using an action plan format where appropriate.  Total time for H and P, chart review, counseling, reviewing neb  device(s) and generating customized AVS unique to this office visit / same day charting = 40 min summary final  f/u ov with fm / limits of medical science in this setting discussed as he appears to be in a rapid geriactric deline

## 2023-08-10 NOTE — Patient Instructions (Signed)
 My office will be contacting you by phone for referral to echocardiogram at [facility]  - if you don't hear back from my office within one week please call us  back or notify us  thru MyChart and we'll address it right away.   We can order another fluid removal if needed while awaiting next cardiology appt   Pulmonary follow up in this clinic is as needed

## 2023-08-10 NOTE — Assessment & Plan Note (Signed)
 Active smoker/MM -  Echo 11/29/22  1. Left ventricular ejection fraction, by estimation, is 25 to 30%. The  left ventricle has severely decreased function. The left ventricle  demonstrates global hypokinesis. Left ventricular diastolic parameters are  consistent with Grade III diastolic  dysfunction (restrictive). Elevated left atrial pressure.   2. Right ventricular systolic function is mildly reduced. The right  ventricular size is normal. Tricuspid regurgitation signal is inadequate  for assessing PA pressure.   3. Left atrial size was severely dilated.   4. Right atrial size was mildly dilated.   5. The mitral valve is abnormal. Trivial mitral valve regurgitation. No  evidence of mitral stenosis. Moderate mitral annular calcification.   6. The aortic valve was not well visualized but there appears to be  severe restriction of the aortic valve leaflet opening. There is severe  calcifcation of the aortic valve. Aortic valve regurgitation is not  visualized. Aortic valve mean gradient  measures 28.0 mmHg. Aortic valve Vmax measures 3.39 m/s. DVI is 0.21.  Overall, this is likely severe aortic valve stenosis, low flow-low  gradient in the setting of severely reduced LVEF.   7. The inferior vena cava is dilated in size with >50% respiratory  variability, suggesting right atrial pressure of 8 mmHg.  RHC/LHC  12/01/22 1.  Mild to moderate nonobstructive coronary artery disease. 2.  Severe bilateral common iliac stenoses, common femoral stenoses, and subclavian stenoses. 3.  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 - New L pleural effusion 06/21/2023  : transudate   with BNP > 5000 > repeat echo ordered 08/10/2023   Cards f/u planned, can removed the pleural effusion prn in meantime   Discussed in detail all the  indications, usual  risks and alternatives  relative to the benefits with patient who agrees to proceed with conservative pulm  f/u as outlined

## 2023-08-10 NOTE — Assessment & Plan Note (Signed)
 Dx 06/21/2023 > rec expedient L tcentesis 06/22/2023 or admit  - L Tcentesis 06/22/2023   x 1.5 liters slt blood tinged  but transudate/nl glucose cytology >>>neg  - Echo ordered 08/10/2023 / f/u cards planned   Almost certainly this is due to CHF > surprisingly does not think prior Tcentesis helped esp  since Note that pleural effusion and copd have the same effect on insp muscles/mechanics (both shorten their length prior to inspiration making them weaker with less force reserve) so they are synergistic in causing sob.   >>> can arrange f/u tap prn

## 2023-08-10 NOTE — Assessment & Plan Note (Signed)
 Active smoker/ MM CT chest 12/15/22 Mild paraseptal emphysema. - 01/31/2023  After extensive coaching inhaler device,  effectiveness =    60% > breztri  trial  - 01/31/2023   Walked on RA  x  2  lap(s) =  approx 300  ft  @ mod /cane pace, stopped due to knee pain  with lowest 02 sats 94%   - 03/14/2023   Walked on RA  x  3  lap(s) =  approx 450  ft  @ slow  pace, stopped due to end of study, legs sore,  with lowest 02 sats 93%  - New L pleural effusion 06/21/2023  : transudate  >>>see sep a/p - Alpha One AT  Phenotype  MM  level   182 - PFT's  07/28/23  FEV1 1.14 (37 % ) ratio 0.65  p 0 % improvement from saba p duoneb prior to study with DLCO  10.27 (41%)   and FV curve mild concavity c/w GOLD 3 criteria rx duoneb qid with bud 0.25 bid  Seems to be doing some better on simplified rx of just duoneb/ bud so no change rx needed

## 2023-08-13 ENCOUNTER — Emergency Department (HOSPITAL_COMMUNITY)

## 2023-08-13 ENCOUNTER — Encounter (HOSPITAL_COMMUNITY): Payer: Self-pay | Admitting: Emergency Medicine

## 2023-08-13 ENCOUNTER — Inpatient Hospital Stay (HOSPITAL_COMMUNITY)
Admission: EM | Admit: 2023-08-13 | Discharge: 2023-08-16 | DRG: 291 | Disposition: A | Discharge status: Hospice | Attending: Family Medicine | Admitting: Family Medicine

## 2023-08-13 DIAGNOSIS — J9601 Acute respiratory failure with hypoxia: Secondary | ICD-10-CM | POA: Diagnosis present

## 2023-08-13 DIAGNOSIS — G9341 Metabolic encephalopathy: Secondary | ICD-10-CM | POA: Diagnosis present

## 2023-08-13 DIAGNOSIS — Z7982 Long term (current) use of aspirin: Secondary | ICD-10-CM

## 2023-08-13 DIAGNOSIS — I739 Peripheral vascular disease, unspecified: Secondary | ICD-10-CM | POA: Diagnosis present

## 2023-08-13 DIAGNOSIS — I130 Hypertensive heart and chronic kidney disease with heart failure and stage 1 through stage 4 chronic kidney disease, or unspecified chronic kidney disease: Secondary | ICD-10-CM | POA: Diagnosis not present

## 2023-08-13 DIAGNOSIS — Z515 Encounter for palliative care: Secondary | ICD-10-CM

## 2023-08-13 DIAGNOSIS — Z7902 Long term (current) use of antithrombotics/antiplatelets: Secondary | ICD-10-CM

## 2023-08-13 DIAGNOSIS — I959 Hypotension, unspecified: Secondary | ICD-10-CM | POA: Diagnosis present

## 2023-08-13 DIAGNOSIS — K219 Gastro-esophageal reflux disease without esophagitis: Secondary | ICD-10-CM | POA: Diagnosis present

## 2023-08-13 DIAGNOSIS — I350 Nonrheumatic aortic (valve) stenosis: Secondary | ICD-10-CM

## 2023-08-13 DIAGNOSIS — N400 Enlarged prostate without lower urinary tract symptoms: Secondary | ICD-10-CM | POA: Diagnosis present

## 2023-08-13 DIAGNOSIS — N1831 Chronic kidney disease, stage 3a: Secondary | ICD-10-CM | POA: Diagnosis present

## 2023-08-13 DIAGNOSIS — I2510 Atherosclerotic heart disease of native coronary artery without angina pectoris: Secondary | ICD-10-CM | POA: Diagnosis present

## 2023-08-13 DIAGNOSIS — Z79899 Other long term (current) drug therapy: Secondary | ICD-10-CM

## 2023-08-13 DIAGNOSIS — I708 Atherosclerosis of other arteries: Secondary | ICD-10-CM | POA: Diagnosis present

## 2023-08-13 DIAGNOSIS — I428 Other cardiomyopathies: Secondary | ICD-10-CM

## 2023-08-13 DIAGNOSIS — R531 Weakness: Secondary | ICD-10-CM | POA: Diagnosis present

## 2023-08-13 DIAGNOSIS — F17200 Nicotine dependence, unspecified, uncomplicated: Secondary | ICD-10-CM | POA: Diagnosis present

## 2023-08-13 DIAGNOSIS — E785 Hyperlipidemia, unspecified: Secondary | ICD-10-CM | POA: Diagnosis present

## 2023-08-13 DIAGNOSIS — Z66 Do not resuscitate: Secondary | ICD-10-CM | POA: Diagnosis present

## 2023-08-13 DIAGNOSIS — I509 Heart failure, unspecified: Secondary | ICD-10-CM | POA: Diagnosis not present

## 2023-08-13 DIAGNOSIS — Z9109 Other allergy status, other than to drugs and biological substances: Secondary | ICD-10-CM

## 2023-08-13 DIAGNOSIS — I5043 Acute on chronic combined systolic (congestive) and diastolic (congestive) heart failure: Secondary | ICD-10-CM | POA: Diagnosis present

## 2023-08-13 DIAGNOSIS — J4489 Other specified chronic obstructive pulmonary disease: Secondary | ICD-10-CM | POA: Diagnosis present

## 2023-08-13 DIAGNOSIS — I5023 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) heart failure: Secondary | ICD-10-CM | POA: Diagnosis present

## 2023-08-13 DIAGNOSIS — J90 Pleural effusion, not elsewhere classified: Secondary | ICD-10-CM | POA: Diagnosis present

## 2023-08-13 DIAGNOSIS — R338 Other retention of urine: Secondary | ICD-10-CM | POA: Diagnosis present

## 2023-08-13 DIAGNOSIS — N183 Chronic kidney disease, stage 3 unspecified: Secondary | ICD-10-CM | POA: Diagnosis present

## 2023-08-13 DIAGNOSIS — Z8673 Personal history of transient ischemic attack (TIA), and cerebral infarction without residual deficits: Secondary | ICD-10-CM

## 2023-08-13 DIAGNOSIS — N401 Enlarged prostate with lower urinary tract symptoms: Secondary | ICD-10-CM | POA: Diagnosis present

## 2023-08-13 DIAGNOSIS — I69392 Facial weakness following cerebral infarction: Secondary | ICD-10-CM

## 2023-08-13 DIAGNOSIS — J438 Other emphysema: Secondary | ICD-10-CM | POA: Diagnosis present

## 2023-08-13 LAB — CBC WITH DIFFERENTIAL/PLATELET
Abs Immature Granulocytes: 0.01 10*3/uL (ref 0.00–0.07)
Basophils Absolute: 0 10*3/uL (ref 0.0–0.1)
Basophils Relative: 1 %
Eosinophils Absolute: 0.2 10*3/uL (ref 0.0–0.5)
Eosinophils Relative: 3 %
HCT: 41.3 % (ref 39.0–52.0)
Hemoglobin: 13.3 g/dL (ref 13.0–17.0)
Immature Granulocytes: 0 %
Lymphocytes Relative: 13 %
Lymphs Abs: 0.9 10*3/uL (ref 0.7–4.0)
MCH: 27.5 pg (ref 26.0–34.0)
MCHC: 32.2 g/dL (ref 30.0–36.0)
MCV: 85.5 fL (ref 80.0–100.0)
Monocytes Absolute: 0.6 10*3/uL (ref 0.1–1.0)
Monocytes Relative: 8 %
Neutro Abs: 4.9 10*3/uL (ref 1.7–7.7)
Neutrophils Relative %: 75 %
Platelets: 243 10*3/uL (ref 150–400)
RBC: 4.83 MIL/uL (ref 4.22–5.81)
RDW: 15.9 % — ABNORMAL HIGH (ref 11.5–15.5)
WBC: 6.6 10*3/uL (ref 4.0–10.5)
nRBC: 0 % (ref 0.0–0.2)

## 2023-08-13 LAB — COMPREHENSIVE METABOLIC PANEL WITH GFR
ALT: 35 U/L (ref 0–44)
AST: 43 U/L — ABNORMAL HIGH (ref 15–41)
Albumin: 2.7 g/dL — ABNORMAL LOW (ref 3.5–5.0)
Alkaline Phosphatase: 92 U/L (ref 38–126)
Anion gap: 13 (ref 5–15)
BUN: 30 mg/dL — ABNORMAL HIGH (ref 8–23)
CO2: 29 mmol/L (ref 22–32)
Calcium: 8.2 mg/dL — ABNORMAL LOW (ref 8.9–10.3)
Chloride: 98 mmol/L (ref 98–111)
Creatinine, Ser: 1.53 mg/dL — ABNORMAL HIGH (ref 0.61–1.24)
GFR, Estimated: 46 mL/min — ABNORMAL LOW (ref >60)
Glucose, Bld: 120 mg/dL — ABNORMAL HIGH (ref 70–99)
Potassium: 3.4 mmol/L — ABNORMAL LOW (ref 3.5–5.1)
Sodium: 140 mmol/L (ref 135–145)
Total Bilirubin: 1 mg/dL (ref 0.0–1.2)
Total Protein: 6.1 g/dL — ABNORMAL LOW (ref 6.5–8.1)

## 2023-08-13 MED ORDER — SODIUM CHLORIDE 0.9 % IV BOLUS
500.0000 mL | Freq: Once | INTRAVENOUS | Status: AC
  Administered 2023-08-13: 500 mL via INTRAVENOUS

## 2023-08-13 NOTE — ED Triage Notes (Signed)
 Pt BIB EMS from home with c/o weakness all week, wife states that she is worried he has been having TIAs all day. Per ems, pt became more weak and pale when they attempted to stand him for orthostatic. Has left sided facial droop from previous stroke.   108/81  20LAC

## 2023-08-14 ENCOUNTER — Emergency Department (HOSPITAL_COMMUNITY)

## 2023-08-14 ENCOUNTER — Other Ambulatory Visit: Payer: Self-pay

## 2023-08-14 DIAGNOSIS — Z515 Encounter for palliative care: Secondary | ICD-10-CM | POA: Diagnosis not present

## 2023-08-14 DIAGNOSIS — J438 Other emphysema: Secondary | ICD-10-CM | POA: Diagnosis present

## 2023-08-14 DIAGNOSIS — Z8673 Personal history of transient ischemic attack (TIA), and cerebral infarction without residual deficits: Secondary | ICD-10-CM | POA: Diagnosis not present

## 2023-08-14 DIAGNOSIS — J4489 Other specified chronic obstructive pulmonary disease: Secondary | ICD-10-CM | POA: Diagnosis present

## 2023-08-14 DIAGNOSIS — I5023 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) heart failure: Secondary | ICD-10-CM | POA: Diagnosis not present

## 2023-08-14 DIAGNOSIS — I2510 Atherosclerotic heart disease of native coronary artery without angina pectoris: Secondary | ICD-10-CM | POA: Diagnosis present

## 2023-08-14 DIAGNOSIS — R338 Other retention of urine: Secondary | ICD-10-CM | POA: Diagnosis present

## 2023-08-14 DIAGNOSIS — E785 Hyperlipidemia, unspecified: Secondary | ICD-10-CM | POA: Diagnosis present

## 2023-08-14 DIAGNOSIS — I708 Atherosclerosis of other arteries: Secondary | ICD-10-CM | POA: Diagnosis present

## 2023-08-14 DIAGNOSIS — F17200 Nicotine dependence, unspecified, uncomplicated: Secondary | ICD-10-CM | POA: Diagnosis present

## 2023-08-14 DIAGNOSIS — J9601 Acute respiratory failure with hypoxia: Secondary | ICD-10-CM | POA: Diagnosis present

## 2023-08-14 DIAGNOSIS — R531 Weakness: Secondary | ICD-10-CM | POA: Diagnosis present

## 2023-08-14 DIAGNOSIS — I130 Hypertensive heart and chronic kidney disease with heart failure and stage 1 through stage 4 chronic kidney disease, or unspecified chronic kidney disease: Secondary | ICD-10-CM | POA: Diagnosis present

## 2023-08-14 DIAGNOSIS — I5043 Acute on chronic combined systolic (congestive) and diastolic (congestive) heart failure: Secondary | ICD-10-CM | POA: Diagnosis present

## 2023-08-14 DIAGNOSIS — G9341 Metabolic encephalopathy: Secondary | ICD-10-CM | POA: Diagnosis present

## 2023-08-14 DIAGNOSIS — Z7902 Long term (current) use of antithrombotics/antiplatelets: Secondary | ICD-10-CM | POA: Diagnosis not present

## 2023-08-14 DIAGNOSIS — Z66 Do not resuscitate: Secondary | ICD-10-CM | POA: Diagnosis present

## 2023-08-14 DIAGNOSIS — I509 Heart failure, unspecified: Secondary | ICD-10-CM | POA: Diagnosis present

## 2023-08-14 DIAGNOSIS — Z7982 Long term (current) use of aspirin: Secondary | ICD-10-CM | POA: Diagnosis not present

## 2023-08-14 DIAGNOSIS — N1831 Chronic kidney disease, stage 3a: Secondary | ICD-10-CM | POA: Diagnosis present

## 2023-08-14 DIAGNOSIS — K219 Gastro-esophageal reflux disease without esophagitis: Secondary | ICD-10-CM | POA: Diagnosis present

## 2023-08-14 DIAGNOSIS — J90 Pleural effusion, not elsewhere classified: Secondary | ICD-10-CM | POA: Diagnosis present

## 2023-08-14 DIAGNOSIS — Z7189 Other specified counseling: Secondary | ICD-10-CM | POA: Diagnosis not present

## 2023-08-14 DIAGNOSIS — N401 Enlarged prostate with lower urinary tract symptoms: Secondary | ICD-10-CM | POA: Diagnosis present

## 2023-08-14 DIAGNOSIS — Z9109 Other allergy status, other than to drugs and biological substances: Secondary | ICD-10-CM | POA: Diagnosis not present

## 2023-08-14 DIAGNOSIS — I428 Other cardiomyopathies: Secondary | ICD-10-CM | POA: Diagnosis present

## 2023-08-14 DIAGNOSIS — I350 Nonrheumatic aortic (valve) stenosis: Secondary | ICD-10-CM | POA: Diagnosis present

## 2023-08-14 LAB — BLOOD GAS, VENOUS
Acid-Base Excess: 11.5 mmol/L — ABNORMAL HIGH (ref 0.0–2.0)
Bicarbonate: 39.3 mmol/L — ABNORMAL HIGH (ref 20.0–28.0)
Drawn by: 66297
O2 Saturation: 91.7 %
Patient temperature: 36.5
pCO2, Ven: 64 mmHg — ABNORMAL HIGH (ref 44–60)
pH, Ven: 7.4 (ref 7.25–7.43)
pO2, Ven: 64 mmHg — ABNORMAL HIGH (ref 32–45)

## 2023-08-14 LAB — URINALYSIS, W/ REFLEX TO CULTURE (INFECTION SUSPECTED)
Bacteria, UA: NONE SEEN
Bilirubin Urine: NEGATIVE
Glucose, UA: NEGATIVE mg/dL
Hgb urine dipstick: NEGATIVE
Ketones, ur: NEGATIVE mg/dL
Leukocytes,Ua: NEGATIVE
Nitrite: NEGATIVE
Protein, ur: >=300 mg/dL — AB
Specific Gravity, Urine: 1.011 (ref 1.005–1.030)
pH: 6 (ref 5.0–8.0)

## 2023-08-14 LAB — BRAIN NATRIURETIC PEPTIDE: B Natriuretic Peptide: >4500 pg/mL — ABNORMAL HIGH (ref 0.0–100.0)

## 2023-08-14 LAB — TROPONIN I (HIGH SENSITIVITY)
Troponin I (High Sensitivity): 50 ng/L — ABNORMAL HIGH (ref <18)
Troponin I (High Sensitivity): 48 ng/L — ABNORMAL HIGH (ref <18)

## 2023-08-14 MED ORDER — ATORVASTATIN CALCIUM 40 MG PO TABS
40.0000 mg | ORAL_TABLET | Freq: Every evening | ORAL | Status: DC
  Administered 2023-08-14: 40 mg via ORAL
  Filled 2023-08-14: qty 1

## 2023-08-14 MED ORDER — BUDESONIDE 0.25 MG/2ML IN SUSP
0.2500 mg | Freq: Two times a day (BID) | RESPIRATORY_TRACT | Status: DC
  Administered 2023-08-14 – 2023-08-16 (×5): 0.25 mg via RESPIRATORY_TRACT
  Filled 2023-08-14 (×5): qty 2

## 2023-08-14 MED ORDER — IPRATROPIUM-ALBUTEROL 0.5-2.5 (3) MG/3ML IN SOLN
3.0000 mL | Freq: Three times a day (TID) | RESPIRATORY_TRACT | Status: DC
  Administered 2023-08-14: 3 mL via RESPIRATORY_TRACT
  Filled 2023-08-14: qty 3

## 2023-08-14 MED ORDER — CLOPIDOGREL BISULFATE 75 MG PO TABS
75.0000 mg | ORAL_TABLET | Freq: Every day | ORAL | Status: DC
  Administered 2023-08-14 – 2023-08-16 (×3): 75 mg via ORAL
  Filled 2023-08-14 (×3): qty 1

## 2023-08-14 MED ORDER — ONDANSETRON HCL 4 MG PO TABS: 4.0000 mg | ORAL_TABLET | Freq: Four times a day (QID) | ORAL | Status: DC | PRN

## 2023-08-14 MED ORDER — IPRATROPIUM-ALBUTEROL 0.5-2.5 (3) MG/3ML IN SOLN: 3.0000 mL | Freq: Three times a day (TID) | RESPIRATORY_TRACT | Status: DC

## 2023-08-14 MED ORDER — TRAZODONE HCL 50 MG PO TABS: 50.0000 mg | ORAL_TABLET | Freq: Every evening | ORAL | Status: DC | PRN

## 2023-08-14 MED ORDER — IPRATROPIUM-ALBUTEROL 0.5-2.5 (3) MG/3ML IN SOLN
3.0000 mL | Freq: Two times a day (BID) | RESPIRATORY_TRACT | Status: DC
  Administered 2023-08-14 – 2023-08-16 (×5): 3 mL via RESPIRATORY_TRACT
  Filled 2023-08-14 (×4): qty 3

## 2023-08-14 MED ORDER — SODIUM CHLORIDE 0.9 % IV SOLN: INTRAVENOUS | Status: AC | PRN

## 2023-08-14 MED ORDER — HEPARIN SODIUM (PORCINE) 5000 UNIT/ML IJ SOLN
5000.0000 [IU] | Freq: Three times a day (TID) | INTRAMUSCULAR | Status: DC
Start: 2023-08-14 — End: 2023-08-15
  Administered 2023-08-14 – 2023-08-15 (×3): 5000 [IU] via SUBCUTANEOUS
  Filled 2023-08-14 (×4): qty 1

## 2023-08-14 MED ORDER — MIDODRINE HCL 5 MG PO TABS
10.0000 mg | ORAL_TABLET | Freq: Three times a day (TID) | ORAL | Status: DC
  Administered 2023-08-14 – 2023-08-16 (×8): 10 mg via ORAL
  Filled 2023-08-14 (×8): qty 2

## 2023-08-14 MED ORDER — SODIUM CHLORIDE 0.9% FLUSH
3.0000 mL | Freq: Two times a day (BID) | INTRAVENOUS | Status: DC
  Administered 2023-08-14 – 2023-08-16 (×5): 3 mL via INTRAVENOUS

## 2023-08-14 MED ORDER — PANTOPRAZOLE SODIUM 40 MG PO TBEC
40.0000 mg | DELAYED_RELEASE_TABLET | Freq: Every day | ORAL | Status: DC
  Administered 2023-08-14 – 2023-08-16 (×3): 40 mg via ORAL
  Filled 2023-08-14 (×3): qty 1

## 2023-08-14 MED ORDER — POLYETHYLENE GLYCOL 3350 17 G PO PACK: 17.0000 g | PACK | Freq: Every day | ORAL | Status: DC | PRN

## 2023-08-14 MED ORDER — FUROSEMIDE 10 MG/ML IJ SOLN
60.0000 mg | Freq: Once | INTRAMUSCULAR | Status: AC
  Administered 2023-08-14: 60 mg via INTRAVENOUS
  Filled 2023-08-14: qty 6

## 2023-08-14 MED ORDER — ACETAMINOPHEN 325 MG PO TABS: 650.0000 mg | ORAL_TABLET | Freq: Four times a day (QID) | ORAL | Status: DC | PRN

## 2023-08-14 MED ORDER — TAMSULOSIN HCL 0.4 MG PO CAPS
0.4000 mg | ORAL_CAPSULE | Freq: Every day | ORAL | Status: DC
  Administered 2023-08-14 – 2023-08-16 (×3): 0.4 mg via ORAL
  Filled 2023-08-14 (×3): qty 1

## 2023-08-14 MED ORDER — NICOTINE 14 MG/24HR TD PT24
14.0000 mg | MEDICATED_PATCH | Freq: Every day | TRANSDERMAL | Status: DC
  Administered 2023-08-14 – 2023-08-16 (×3): 14 mg via TRANSDERMAL
  Filled 2023-08-14 (×3): qty 1

## 2023-08-14 MED ORDER — ASPIRIN 81 MG PO TBEC
81.0000 mg | DELAYED_RELEASE_TABLET | Freq: Every day | ORAL | Status: DC
  Administered 2023-08-14: 81 mg via ORAL
  Filled 2023-08-14 (×2): qty 1

## 2023-08-14 MED ORDER — SODIUM CHLORIDE 0.9% FLUSH
3.0000 mL | Freq: Two times a day (BID) | INTRAVENOUS | Status: DC
  Administered 2023-08-14 – 2023-08-16 (×6): 3 mL via INTRAVENOUS

## 2023-08-14 MED ORDER — FAMOTIDINE 20 MG PO TABS
20.0000 mg | ORAL_TABLET | Freq: Every day | ORAL | Status: DC
  Administered 2023-08-14 – 2023-08-16 (×3): 20 mg via ORAL
  Filled 2023-08-14 (×3): qty 1

## 2023-08-14 MED ORDER — FUROSEMIDE 10 MG/ML IJ SOLN
20.0000 mg | Freq: Two times a day (BID) | INTRAMUSCULAR | Status: DC
  Administered 2023-08-15: 20 mg via INTRAVENOUS
  Filled 2023-08-14 (×4): qty 2

## 2023-08-14 MED ORDER — BISACODYL 10 MG RE SUPP: 10.0000 mg | Freq: Every day | RECTAL | Status: DC | PRN

## 2023-08-14 MED ORDER — SODIUM CHLORIDE 0.9% FLUSH: 3.0000 mL | INTRAVENOUS | Status: DC | PRN

## 2023-08-14 MED ORDER — ONDANSETRON HCL 4 MG/2ML IJ SOLN: 4.0000 mg | Freq: Four times a day (QID) | INTRAMUSCULAR | Status: DC | PRN

## 2023-08-14 MED ORDER — ALBUTEROL SULFATE (2.5 MG/3ML) 0.083% IN NEBU: 2.5000 mg | INHALATION_SOLUTION | RESPIRATORY_TRACT | Status: DC | PRN

## 2023-08-14 MED ORDER — ACETAMINOPHEN 650 MG RE SUPP
650.0000 mg | Freq: Four times a day (QID) | RECTAL | Status: DC | PRN
Start: 2023-08-14 — End: 2023-08-17

## 2023-08-14 NOTE — ED Provider Notes (Addendum)
 [place] EMERGENCY DEPARTMENT AT [facility] Provider Note   CSN: [id number] Arrival date & time: 08/13/23  2022     History  Chief Complaint  Patient presents with   Weakness    Tommy Knight is a 78 y.o. male.  Patient was brought in by family because of weakness.  He also has been sleeping a lot.  Patient has a history of COPD and a stroke and chf  The history is provided by a relative. No language interpreter was used.  Weakness Severity:  Moderate Onset quality:  Sudden Timing:  Constant Progression:  Worsening Chronicity:  Recurrent Context: not alcohol use   Relieved by:  Nothing Worsened by:  Nothing Ineffective treatments:  None tried Associated symptoms: no abdominal pain, no chest pain, no cough, no diarrhea, no frequency, no headaches and no seizures        Home Medications Prior to Admission medications   Medication Sig Start Date End Date Taking? Authorizing Provider  albuterol  (VENTOLIN  HFA) 108 (90 Base) MCG/ACT inhaler Inhale 2 puffs into the lungs every 2 (two) hours as needed for wheezing or shortness of breath. 05/04/23   Jodeane Mulligan, DO  aspirin  EC 81 MG tablet Take 1 tablet (81 mg total) by mouth at bedtime. Swallow whole. 12/03/22   Haydee Lipa, MD  atorvastatin  (LIPITOR) 40 MG tablet Take 40 mg by mouth every evening.    [provider]  budesonide  (PULMICORT ) 0.25 MG/2ML nebulizer solution One twice daily with duoneb 05/26/23   Wert, Michael B, MD  clopidogrel  (PLAVIX ) 75 MG tablet Take 1 tablet (75 mg total) by mouth daily. 05/05/23   Jodeane Mulligan, DO  famotidine  (PEPCID ) 20 MG tablet One after supper Patient taking differently: Take 20 mg by mouth daily. 01/31/23   Diamond Formica, MD  furosemide  (LASIX ) 40 MG tablet Take 1 tablet (40 mg total) by mouth 2 (two) times daily. Thereafter take 40 mg daily as needed for weight gain greater than 5 pounds in 2 days. 06/03/23 06/02/24  Mallipeddi, Vishnu P, MD   ipratropium-albuterol  (DUONEB) 0.5-2.5 (3) MG/3ML SOLN Take 3 mLs by nebulization every 6 (six) hours as needed (for shortness of breath).    [provider]  levocetirizine (XYZAL) 5 MG tablet Take 5 mg by mouth every evening.    [provider]  montelukast  (SINGULAIR ) 10 MG tablet Take 10 mg by mouth every evening.    [provider]  Multiple Vitamins-Minerals (CENTRUM SILVER 50+MEN) TABS Take 1 tablet by mouth daily.    [provider]  Multiple Vitamins-Minerals (PRESERVISION AREDS 2) CAPS Take 1 capsule by mouth 2 (two) times daily.    [provider]  omeprazole (PRILOSEC) 20 MG capsule Take 20 mg by mouth daily.    [provider]      Allergies    Bee pollen    Review of Systems   Review of Systems  Constitutional:  Negative for appetite change and fatigue.  HENT:  Negative for congestion, ear discharge and sinus pressure.   Eyes:  Negative for discharge.  Respiratory:  Negative for cough.   Cardiovascular:  Negative for chest pain.  Gastrointestinal:  Negative for abdominal pain and diarrhea.  Genitourinary:  Negative for frequency and hematuria.  Musculoskeletal:  Negative for back pain.  Skin:  Negative for rash.  Neurological:  Positive for weakness. Negative for seizures and headaches.  Psychiatric/Behavioral:  Negative for hallucinations.     Physical Exam Updated Vital  Signs BP 112/82   Pulse 77   Temp 97.7 F (36.5 C)   Resp (!) 32   Ht 5\' 11"  (1.803 m)   Wt 74.8 kg   SpO2 96%   BMI 23.00 kg/m  Physical Exam Vitals and nursing note reviewed.  Constitutional:      Appearance: He is well-developed.  HENT:     Head: Normocephalic.     Nose: Congestion present.  Eyes:     General: No scleral icterus.    Conjunctiva/sclera: Conjunctivae normal.  Neck:     Thyroid : No thyromegaly.  Cardiovascular:     Rate and Rhythm: Normal rate and regular rhythm.     Heart sounds: No murmur heard.    No  friction rub. No gallop.  Pulmonary:     Breath sounds: No stridor. No wheezing or rales.  Chest:     Chest wall: No tenderness.  Abdominal:     General: There is no distension.     Tenderness: There is no abdominal tenderness. There is no rebound.  Musculoskeletal:        General: Normal range of motion.     Cervical back: Neck supple.  Lymphadenopathy:     Cervical: No cervical adenopathy.  Skin:    Findings: No erythema or rash.  Neurological:     Mental Status: He is alert.     Motor: No abnormal muscle tone.     Coordination: Coordination normal.     Comments: Oriented to person and place only  Psychiatric:        Behavior: Behavior normal.     ED Results / Procedures / Treatments   Labs (all labs ordered are listed, but only abnormal results are displayed) Labs Reviewed  CBC WITH DIFFERENTIAL/PLATELET - Abnormal; Notable for the following components:      Result Value   RDW 15.9 (*)    All other components within normal limits  COMPREHENSIVE METABOLIC PANEL WITH GFR - Abnormal; Notable for the following components:   Potassium 3.4 (*)    Glucose, Bld 120 (*)    BUN 30 (*)    Creatinine, Ser 1.53 (*)    Calcium  8.2 (*)    Total Protein 6.1 (*)    Albumin 2.7 (*)    AST 43 (*)    GFR, Estimated 46 (*)    All other components within normal limits  BLOOD GAS, VENOUS    EKG None  Radiology CT Head Wo Contrast Result Date: 08/13/2023 EXAM: CT HEAD WITHOUT 08/13/2023 09:56:32 PM TECHNIQUE: CT of the head was performed without the administration of intravenous contrast. Automated exposure control, iterative reconstruction, and/or weight based adjustment of the mA/kV was utilized to reduce the radiation dose to as low as reasonably achievable. COMPARISON: 08/22/2017 CLINICAL HISTORY: Memory loss. c/o weakness all week, wife states that she is worried he has been having TIAs all day. Per ems, pt became more weak and pale when they attempted to stand him for  orthostatic. Has left sided facial droop from previous stroke. FINDINGS: BRAIN AND VENTRICLES: Global cortical atrophy. Subcortical and periventricular small vessel ischemic changes. There is no acute intracranial hemorrhage, mass effect or midline shift. No abnormal extra-axial fluid collection. The gray-white differentiation is maintained without evidence of an acute infarct. There is no evidence of hydrocephalus. ORBITS: The visualized portion of the orbits demonstrate no acute abnormality. SINUSES: The visualized paranasal sinuses and mastoid air cells demonstrate no acute abnormality. SOFT TISSUES AND SKULL: No acute abnormality of the  visualized skull or soft tissues. IMPRESSION: 1. No acute intracranial abnormality. Electronically signed by: Zadie Herter MD 08/13/2023 10:01 PM EDT RP Workstation: [id number]    Procedures Procedures    Medications Ordered in ED Medications  sodium chloride  0.9 % bolus 500 mL (0 mLs Intravenous Stopped 08/13/23 2244)    ED Course/ Medical Decision Making/ A&P  Patient has become lethargic in the emergency department.  He could be retaining CO2.  Will get a chest x-ray and a venous gas                               Medical Decision Making Amount and/or Complexity of Data Reviewed Labs: ordered. Radiology: ordered.  Risk Prescription drug management. Decision regarding hospitalization.   Lethargy and weakness.  Dr. Carie Charity will disposition        Final Clinical Impression(s) / ED Diagnoses Final diagnoses:  None    Rx / DC Orders ED Discharge Orders     None         Cheyenne Cotta, MD 08/14/23 1120    Cheyenne Cotta, MD 08/14/23 1122

## 2023-08-14 NOTE — ED Notes (Signed)
 EDP aware of pt's awakened state. Pt was asking "what the hell is wrong with me" "I must of had one of my spells". Pt is alert and oriented engaging in conversation with this RN and family members.  Per EDP pt can eat/drink. Pt given [place] sandwich and sprite to drink

## 2023-08-14 NOTE — Progress Notes (Signed)
 Family has left; patient sleeping soundly. Responded to voice. Bp improved.

## 2023-08-14 NOTE — Progress Notes (Signed)
 Patient arrived to unit with family at bedside. Patient has several scratches to his legs bilaterally; and a skin tear to his right ankle. Dressed with mepilex. Patient family report he scratches a lot in his sleep. Patient glasses on patient. Patient has hearing aides charging by the bed and there is a cell phone on the sink counter. Notified Charge & Dr. Josiah Nigh of patient remaining a yellow mews. Call bell within reach of patient and family is at bedside.

## 2023-08-14 NOTE — Plan of Care (Signed)

## 2023-08-14 NOTE — Progress Notes (Signed)
 Attempted to get report.

## 2023-08-14 NOTE — ED Notes (Signed)
 Upon assessment, pt is lethargic. Only responds to pain at this time. This RN did a sternal rub to stimulate pt. Pt would only open and close eyes and moan. This RN went to EDP with concern. Pt received lasix  and the concern by family was that pt would not awaken to urinate. EDP advised to in and out pt and put a male external catheter in place to keep pt dry.

## 2023-08-14 NOTE — ED Notes (Signed)
 This RN and NT went to ambulate pt. Pt was lethargic and difficult to arouse. This RN turned off O2 and pt O2 sats dropped to 89%. Pt placed back on 3 L [place]. EDP made aware.  See orders

## 2023-08-14 NOTE — Progress Notes (Signed)
   08/14/23 0934  Vitals  Temp 98.6 F (37 C)  Temp Source Oral  BP (!) 85/73  MAP (mmHg) 80  BP Method Automatic  Pulse Rate 76  Pulse Rate Source Monitor  Resp (!) 22  MEWS COLOR  MEWS Score Color Yellow  Oxygen Therapy  SpO2 99 %  Pain Assessment  Pain Scale 0-10  Pain Score 0  MEWS Score  MEWS Temp 0  MEWS Systolic 1  MEWS Pulse 0  MEWS RR 1  MEWS LOC 0  MEWS Score 2   Patient remains yellow mews.

## 2023-08-14 NOTE — ED Provider Notes (Signed)
  Physical Exam  BP 97/74   Pulse 69   Temp 97.7 F (36.5 C) (Axillary)   Resp (!) 28   Ht 5\' 11"  (1.803 m)   Wt 74.8 kg   SpO2 90%   BMI 23.00 kg/m   Physical Exam  Procedures  Procedures  ED Course / MDM    Medical Decision Making Amount and/or Complexity of Data Reviewed Labs: ordered. Radiology: ordered.  Risk Prescription drug management. Decision regarding hospitalization.   PT comes in with cc of aloc. Hx of chf, cad, stroke, copd. Workup shows elevated bnp, CXR shows L pleural effusion. He had some somnolence last night and ams.  Admit to medicine for chf, pleural effusion, ams workup.        Deatra Face, MD 08/14/23 [phone]

## 2023-08-14 NOTE — H&P (Signed)
 History and Physical   Patient: Tommy Knight GUY:[id number] DOB: 04-02-1945 DOA: 08/13/2023 DOS: the patient was seen and examined on 08/14/2023 PCP: [name], NP  Patient coming from: Home  Chief Complaint:  Chief Complaint  Patient presents with   Weakness   HPI: Tommy Knight is a 78 y.o. 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 - Additional history obtained from patient's wife and daughter at bedside No fever  Or chills  - He does have some cough, mostly nonproductive - Complains of generalized weakness and fatigue and concerns about lethargy and some confusional episodes - Patient received IV Lasix  in the ED with good urine output but then blood pressure became soft - In the ED patient was found to be hypoxic and placed on up to 2 L of oxygen --Patient denies frank chest pains, leg swelling noted but no pleuritic symptoms In ED  -moderate left-sided effusion on chest x-ray noted Troponin 50 >>48 , EKG shows sinus rhythm with LVH and repolarization abnormalities BNP >>>4,500 - PH is  7.4 on VBG - UA largely unremarkable - CBC largely unremarkable - Sodium is 140, potassium 3.4, creatinine 1.53 which is actually better than recent baseline, bicarb is 29  - LFTs with AST of 43 otherwise not elevated  Review of Systems: As mentioned in the history of present illness. All other systems reviewed and are negative. Past Medical History:  Diagnosis Date   Allergic rhinitis    Asthma    Carotid artery disease (HCC)    COPD (chronic obstructive pulmonary disease) (HCC)    Hyperlipidemia    Hypertension    Stenosis  of left subclavian artery (HCC)    Stroke [facility])    Past Surgical History:  Procedure Laterality Date   APPENDECTOMY     RIGHT/LEFT HEART CATH AND CORONARY ANGIOGRAPHY N/A 12/02/2022   Procedure: RIGHT/LEFT HEART CATH AND CORONARY ANGIOGRAPHY;  Surgeon: Kyra Phy, MD;  Location: [facility] INVASIVE CV LAB;  Service: Cardiovascular;  Laterality: N/A;   Social History:  reports that he has been smoking. He has never used smokeless tobacco. He reports current alcohol use. He reports that he does not use drugs.  Allergies  Allergen Reactions   Bee Pollen Other (See Comments)    Seasonal allergies    Family History  Problem Relation Age of Onset   Heart disease Other        multiple family members he's not sure    Prior to Admission medications   Medication Sig Start Date End Date Taking? Authorizing Provider  albuterol  (VENTOLIN  HFA) 108 (90 Base) MCG/ACT inhaler Inhale 2 puffs into the lungs every 2 (two) hours as needed for wheezing or shortness of breath. 05/04/23   Jodeane Mulligan, DO  aspirin  EC 81 MG tablet Take 1 tablet (81 mg total) by mouth at bedtime. Swallow whole. 12/03/22   Haydee Lipa, MD  atorvastatin  (LIPITOR) 40 MG tablet Take 40 mg by mouth every evening.    [provider]  budesonide  (PULMICORT ) 0.25 MG/2ML nebulizer solution One twice daily with duoneb 05/26/23   Diamond Formica, MD  clopidogrel  (PLAVIX ) 75 MG tablet Take 1 tablet (75 mg  total) by mouth daily. 05/05/23   Jodeane Mulligan, DO  famotidine  (PEPCID ) 20 MG tablet One after supper Patient taking differently: Take 20 mg by mouth daily. 01/31/23   Diamond Formica, MD  furosemide  (LASIX ) 40 MG tablet Take 1 tablet (40 mg total) by mouth 2 (two) times daily. Thereafter take 40 mg daily as needed for weight gain greater than 5 pounds in 2 days. 06/03/23 06/02/24  Mallipeddi, Vishnu P, MD  ipratropium-albuterol  (DUONEB) 0.5-2.5 (3) MG/3ML SOLN Take 3 mLs by nebulization every 6 (six) hours as needed  (for shortness of breath).    [provider]  levocetirizine (XYZAL) 5 MG tablet Take 5 mg by mouth every evening.    [provider]  montelukast  (SINGULAIR ) 10 MG tablet Take 10 mg by mouth every evening.    [provider]  Multiple Vitamins-Minerals (CENTRUM SILVER 50+MEN) TABS Take 1 tablet by mouth daily.    [provider]  Multiple Vitamins-Minerals (PRESERVISION AREDS 2) CAPS Take 1 capsule by mouth 2 (two) times daily.    [provider]  omeprazole (PRILOSEC) 20 MG capsule Take 20 mg by mouth daily.    [provider]    Physical Exam: Vitals:   08/14/23 0800 08/14/23 0934 08/14/23 1338 08/14/23 1500  BP: 99/78 (!) 85/73  99/83  Pulse: 70 76  (!) 59  Resp: (!) 34 (!) 22  19  Temp:  98.6 F (37 C)  98 F (36.7 C)  TempSrc:  Oral    SpO2: 100% 99% 100%   Weight:      Height:        Physical Exam  Gen:- Awake Alert, in no acute distress  HEENT:- [place].AT, No sclera icterus Nose- [place] 3L/min Neck-Supple Neck,No JVD,.  Lungs-breath sounds on the left with scattered Rales CV- S1, S2 normal, RRR, 3/6 SM Abd-  +ve B.Sounds, Abd Soft, No tenderness,    Extremity/Skin:- +ve edema,   good pedal pulses  Psych-affect is appropriate, oriented x3 Neuro-generalized weakness ,no new focal deficits, no tremors  Data Reviewed: -moderate left-sided effusion on chest x-ray noted Troponin 50 >>48 , EKG shows sinus rhythm with LVH and repolarization abnormalities BNP >>>4,500 - PH is  7.4 on VBG - UA largely unremarkable - CBC largely unremarkable - Sodium is 140, potassium 3.4, creatinine 1.53 which is actually better than recent baseline, bicarb is 29  - LFTs with AST of 43 otherwise not elevated  Assessment and Plan: 1) 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 - complicated by severe low flow low gradient AS - Echo 11/2022: LVE 25-30%, grade III dd, severe LAE, severe AS mean grad 28, AVA VTI 0.61, DI  0.21  - CXR left effusion, BNP >4500 -Patient received IV Lasix  in the ED with good urine output but then blood pressure became soft - Soft blood pressure complicates attempts at diuresis or other cardiac Meds (he cannot tolerate GDMT as he is preload dependent) - Strict I's and O's -Daily weight -REDs Clip - Cardiology consult requested -Consider left-sided thoracentesis   2)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 -Primary cardiologist previously recommended palliative approach - Diuresis as above  3) COPD/paraseptal emphysema--No acute exacerbation at this time  - Avoid steroids due to risk of fluid retention  - Bronchodilators as ordered    4)CKD stage -3A  creatinine 1.53 which is actually better than  recent baseline, bicarb is 29  - renally adjust medications, avoid nephrotoxic agents / dehydration  / hypotension  5)Prior CVA 2019 -Atorvastatin , aspirin  and Plavix  for secondary prevention  6) BPH urinary retention--reluctantly try Flomax with concerns for possible Flomax induced orthostatic issues - In and out catheterization PRN  7)GERD--- continue Protonix   8) acute hypoxic respiratory failure--currently requiring up to 3 L of oxygen via nasal cannula - Anticipate hypoxia will resolve with thoracentesis and diuresis  9) social/ethics--- plan of care and advanced directive discussed with patient, patient's daughter and wife at bedside - He is a DNR - Official palliative care consult to help further delineate goals of care requested   Advance Care Planning:   Code Status: Limited: Do not attempt resuscitation (DNR) -DNR-LIMITED -Do Not Intubate/DNI    Family Communication: Wife and daughter at bedside  Severity of Illness: The appropriate patient status for this patient is INPATIENT. Inpatient status is judged to be reasonable and necessary in order to  provide the required intensity of service to ensure the patient's safety. The patient's presenting symptoms, physical exam findings, and initial radiographic and laboratory data in the context of their chronic comorbidities is felt to place them at high risk for further clinical deterioration. Furthermore, it is not anticipated that the patient will be medically stable for discharge from the hospital within 2 midnights of admission.   * I certify that at the point of admission it is my clinical judgment that the patient will require inpatient hospital care spanning beyond 2 midnights from the point of admission due to high intensity of service, high risk for further deterioration and high frequency of surveillance required.*  Author: Colin Dawley, MD 08/14/2023 6:43 PM  For on call review [email or web address].

## 2023-08-14 NOTE — Progress Notes (Signed)
 Patient was bladder scanned after dribbling and stating he felt like he couldn't pee. Scan resulted in >500 ml in bladder. Notified Dr. Josiah Nigh. New order for in/out and daily wt. Patient in/out cath, 600 ml clear yellow urine obtained.

## 2023-08-14 NOTE — ED Provider Notes (Signed)
 Patient signed out to me by Dr. Zammit.  Patient originally brought in because of generalized weakness and altered mental status.  CT head was unremarkable.  Basic workup was negative.  Upon reevaluation, patient was difficult to arouse.  Additional workup was performed.  Patient mildly hypoxic, on oxygen.  This is not normal for him.  X-ray shows pleural effusion.  Patient recently had drainage of this effusion, study showed that it was transudative and it was felt to be secondary to his CHF.  Patient has markedly elevated BNP.  Initiated on Lasix , not currently taking Lasix .  Patient did wake up and was able to have full conversations without difficulty.  Unclear what his mental status changes were secondary to.  Continue to monitor, admit to hospitalist service.   Ballard Bongo, MD 08/14/23 [phone]

## 2023-08-15 ENCOUNTER — Inpatient Hospital Stay (HOSPITAL_COMMUNITY)

## 2023-08-15 ENCOUNTER — Encounter (HOSPITAL_COMMUNITY): Payer: Self-pay | Admitting: Family Medicine

## 2023-08-15 DIAGNOSIS — Z515 Encounter for palliative care: Secondary | ICD-10-CM | POA: Diagnosis not present

## 2023-08-15 DIAGNOSIS — Z7189 Other specified counseling: Secondary | ICD-10-CM

## 2023-08-15 DIAGNOSIS — I5023 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) heart failure: Secondary | ICD-10-CM | POA: Diagnosis not present

## 2023-08-15 LAB — BODY FLUID CELL COUNT WITH DIFFERENTIAL
Lymphs, Fluid: 82 %
Monocyte-Macrophage-Serous Fluid: 12 % — ABNORMAL LOW (ref 50–90)
Neutrophil Count, Fluid: 6 % (ref 0–25)
Total Nucleated Cell Count, Fluid: 347 uL (ref 0–1000)

## 2023-08-15 LAB — BASIC METABOLIC PANEL WITH GFR
Anion gap: 10 (ref 5–15)
BUN: 35 mg/dL — ABNORMAL HIGH (ref 8–23)
CO2: 30 mmol/L (ref 22–32)
Calcium: 8 mg/dL — ABNORMAL LOW (ref 8.9–10.3)
Chloride: 98 mmol/L (ref 98–111)
Creatinine, Ser: 1.62 mg/dL — ABNORMAL HIGH (ref 0.61–1.24)
GFR, Estimated: 43 mL/min — ABNORMAL LOW (ref >60)
Glucose, Bld: 112 mg/dL — ABNORMAL HIGH (ref 70–99)
Potassium: 3.7 mmol/L (ref 3.5–5.1)
Sodium: 138 mmol/L (ref 135–145)

## 2023-08-15 LAB — LACTIC ACID, PLASMA: Lactic Acid, Venous: 0.9 mmol/L (ref 0.5–1.9)

## 2023-08-15 LAB — LACTATE DEHYDROGENASE: LDH: 178 U/L (ref 98–192)

## 2023-08-15 LAB — BLOOD GAS, ARTERIAL
Acid-Base Excess: 12 mmol/L — ABNORMAL HIGH (ref 0.0–2.0)
Bicarbonate: 40.2 mmol/L — ABNORMAL HIGH (ref 20.0–28.0)
Drawn by: 23430
O2 Saturation: 97.3 %
Patient temperature: 36.6
pCO2 arterial: 67 mmHg (ref 32–48)
pH, Arterial: 7.39 (ref 7.35–7.45)
pO2, Arterial: 87 mmHg (ref 83–108)

## 2023-08-15 LAB — GRAM STAIN: Gram Stain: NONE SEEN

## 2023-08-15 LAB — LACTATE DEHYDROGENASE, PLEURAL OR PERITONEAL FLUID: LD, Fluid: 76 U/L — ABNORMAL HIGH (ref 3–23)

## 2023-08-15 MED ORDER — LIDOCAINE HCL (PF) 2 % IJ SOLN
INTRAMUSCULAR | Status: AC
  Filled 2023-08-15: qty 10

## 2023-08-15 MED ORDER — HALOPERIDOL LACTATE 2 MG/ML PO CONC: 0.5000 mg | ORAL | Status: DC | PRN

## 2023-08-15 MED ORDER — GLYCOPYRROLATE 0.2 MG/ML IJ SOLN: 0.2000 mg | INTRAMUSCULAR | Status: DC | PRN

## 2023-08-15 MED ORDER — POLYVINYL ALCOHOL 1.4 % OP SOLN: 1.0000 [drp] | Freq: Four times a day (QID) | OPHTHALMIC | Status: DC | PRN

## 2023-08-15 MED ORDER — HALOPERIDOL LACTATE 5 MG/ML IJ SOLN: 0.5000 mg | INTRAMUSCULAR | Status: DC | PRN

## 2023-08-15 MED ORDER — GLYCOPYRROLATE 0.2 MG/ML IJ SOLN
0.2000 mg | INTRAMUSCULAR | Status: DC | PRN
Start: 2023-08-15 — End: 2023-08-17

## 2023-08-15 MED ORDER — GLYCOPYRROLATE 1 MG PO TABS: 1.0000 mg | ORAL_TABLET | ORAL | Status: DC | PRN

## 2023-08-15 MED ORDER — HALOPERIDOL 0.5 MG PO TABS
0.5000 mg | ORAL_TABLET | ORAL | Status: DC | PRN
Start: 2023-08-15 — End: 2023-08-17

## 2023-08-15 MED ORDER — BIOTENE DRY MOUTH MT LIQD: 15.0000 mL | OROMUCOSAL | Status: DC | PRN

## 2023-08-15 MED ORDER — FENTANYL CITRATE PF 50 MCG/ML IJ SOSY: 12.5000 ug | PREFILLED_SYRINGE | INTRAMUSCULAR | Status: DC | PRN

## 2023-08-15 NOTE — TOC Initial Note (Signed)
 Transition of Care [facility]) - Initial/Assessment Note    Patient Details  Name: Tommy Knight MRN: [id number] Date of Birth: 18-Sep-1945  Transition of Care [facility]) CM/SW Contact:    Juanda Noon Phone Number: 08/15/2023, 3:11 PM  Clinical Narrative:                  This Clinical research associate was made aware by Evalyn Hillier the [facility]- NP that family is agreeable to an inpatient hospice facility in [place] with [facility] . Writer briefly assessed patient pt by speaking with spouse who is at bedside and confirmed IP hospice. After speaking with spouse, Clinical research associate called Odilia Bennett with [facility] regarding referral. Odilia Bennett shared that she will send it over to her team and have a nurse come out to assess. TOC to follow.   Expected Discharge Plan: Hospice Medical Facility Barriers to Discharge: Continued Medical Work up   Patient Goals and CMS Choice Patient states their goals for this hospitalization and ongoing recovery are:: Comfort care - Hospice IP in [place] facility with [facility] CMS Medicare.gov Compare Post Acute Care list provided to:: Patient Represenative (must comment) (Spouse and daughter) Choice offered to / list presented to : Spouse, Adult Children      Expected Discharge Plan and Services In-house Referral: Hospice / Palliative Care, Clinical Social Work Discharge Planning Services: CM Consult Post Acute Care Choice: Durable Medical Equipment Living arrangements for the past 2 months: Single Family Home                 DME Arranged: N/A DME Agency: NA       HH Arranged: NA HH Agency: [facility]        Prior Living Arrangements/Services Living arrangements for the past 2 months: Single Family Home Lives with:: Spouse Patient language and need for interpreter reviewed:: Yes Do you feel safe going back to the place where you live?: No   Family wants IP hospice facility  Need for Family Participation in Patient Care: Yes  (Comment) Care giver support system in place?: No (comment) Current home services: DME Criminal Activity/Legal Involvement Pertinent to Current Situation/Hospitalization: No - Comment as needed  Activities of Daily Living   ADL Screening (condition at time of admission) Independently performs ADLs?: Yes (appropriate for developmental age) Is the patient deaf or have difficulty hearing?: Yes Does the patient have difficulty seeing, even when wearing glasses/contacts?: Yes Does the patient have difficulty concentrating, remembering, or making decisions?: Yes  Permission Sought/Granted      Share Information with NAME: Judie Noun     Permission granted to share info w Relationship: Spouse     Emotional Assessment Appearance:: Appears stated age       Alcohol / Substance Use: Tobacco Use Psych Involvement: No (comment)  Admission diagnosis:  Acute exacerbation of CHF (congestive heart failure) (HCC) [I50.9] Pleural effusion on left [J90] Congestive heart failure, unspecified HF chronicity, unspecified heart failure type (HCC) [I50.9] Patient Active Problem List   Diagnosis Date Noted   Pleural effusion on left 06/22/2023   Urinary hesitancy 06/21/2023   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) heart failure (HCC) 05/02/2023   CKD (chronic kidney disease) stage 3, GFR 30-59 ml/min (HCC) 05/02/2023   Chronic systolic heart failure (HCC) 02/08/2023   Nonischemic cardiomyopathy (HCC) 02/08/2023   PAD (peripheral artery disease) (HCC) 02/08/2023   CAD (coronary artery disease) 02/08/2023   Allergic rhinitis 01/31/2023   BPH (benign prostatic hyperplasia) 01/31/2023   DOE (dyspnea on exertion) 01/31/2023  Cigarette smoker 01/31/2023   Acute systolic heart failure (HCC) 12/02/2022   Severe aortic stenosis 12/02/2022   Bilateral carotid artery stenosis 12/02/2022   Stenosis of both vertebral arteries 12/02/2022   Subclavian artery stenosis (HCC) 12/02/2022   Tobacco use disorder  11/28/2022   AKI (acute kidney injury) (HCC) 11/28/2022   Acute respiratory failure with hypoxia (HCC) 11/28/2022   CHF (congestive heart failure) (HCC) 11/28/2022   Hyperlipidemia 11/28/2022   Essential hypertension 11/28/2022   Elevated troponin 11/28/2022   COPD GOLD 3 11/28/2022   Severe sepsis (HCC) 11/27/2022   CVA (cerebral vascular accident) (HCC) 08/22/2017   PCP:  [name], NP Pharmacy:   [facility] - [place], [place] - [address] [place] [address] Phone: [phone] Fax: [phone]     Social Drivers of Health (SDOH) Social History: SDOH Screenings   Food Insecurity: No Food Insecurity (08/14/2023)  Housing: Low Risk  (08/14/2023)  Transportation Needs: No Transportation Needs (08/14/2023)  Utilities: Not At Risk (08/14/2023)  Social Connections: Unknown (08/14/2023)  Tobacco Use: High Risk (08/13/2023)   SDOH Interventions:     Readmission Risk Interventions    08/15/2023    1:35 PM  Readmission Risk Prevention Plan  Transportation Screening Complete  Home Care Screening Complete  Medication Review (RN CM) Complete

## 2023-08-15 NOTE — Progress Notes (Signed)
 Patient difficult to arouse. Vitals taken, stable. BP 102/80 map 88, hr 75, o2 98% on 3L. Sternal rub done to stimulate patient. Patient wouldn't open eyes, just moans out. Pupils reactive and brisk when lifted. Patient eventually smiled and said hey but still not wanting to open his eyes. Patient abdomen noted to be distended. Bladder scan done, noted >387. [place] made aware. Received order for in and out cath. Cath done. Patient had 400 mL clear yellow urine obtained. Patient opened eyes. Patient having some confusion, unsure of where he is.

## 2023-08-15 NOTE — Consult Note (Signed)
 Cardiology Consultation   Patient ID: HUIE GHUMAN MRN: [id number]; DOB: [birth year]  Admit date: 08/13/2023 Date of Consult: 08/15/2023  PCP:  [name], NP   [facility] Providers Cardiologist:  Vishnu P Mallipeddi, MD    Patient Profile: Tommy Knight is a 78 y.o. male with a hx of history of PAD, mild to mod CAD, chronic HFrEF, severe AS not a TAVR candidate who is being seen 08/15/2023 for the evaluation of SOB at the request of Dr Quintella Buck.  History of Present Illness: Tommy Knight 78 yo male history of PAD, mild to mod CAD, chronic HFrEF, severe AS not a TAVR candidate admitted with SOB/DOE and weakness. Patient is somnolent this morning, not able to collect history.    From Dr Learta Prost notes: "Multidisciplinary imaging review conducted in October 2024 and the consensus was that patient is not a candidate for TAVR given significant PAD and carotid approach was not feasible due to disease at the ostium of the left carotid. Medical management was strongly recommended and palliative care if necessary"  - from 05/2023 cardiology office note he turned down outpatient palliative care follow up and hospice.    WBC 6.6 Hgb 13.3 Plt 243 K 3.4 BUN 30 Cr 1.53 BNP >4500 Trop 50-->48 EKG SR, LVH with associated ST/T changes CXR: moderate left pleural effusion CT head: no acute process  Past Medical History:  Diagnosis Date   Allergic rhinitis    Asthma    Carotid artery disease (HCC)    COPD (chronic obstructive pulmonary disease) (HCC)    Hyperlipidemia    Hypertension    Stenosis of left subclavian artery (HCC)    Stroke [facility])     Past Surgical History:  Procedure Laterality Date   APPENDECTOMY     RIGHT/LEFT HEART CATH AND CORONARY ANGIOGRAPHY N/A 12/02/2022   Procedure: RIGHT/LEFT HEART CATH AND CORONARY ANGIOGRAPHY;  Surgeon: Kyra Phy, MD;  Location: [facility] INVASIVE CV LAB;  Service: Cardiovascular;  Laterality: N/A;      Scheduled Meds:  aspirin   EC  81 mg Oral Q breakfast   atorvastatin   40 mg Oral QPM   budesonide  (PULMICORT ) nebulizer solution  0.25 mg Nebulization BID   clopidogrel   75 mg Oral Daily   famotidine   20 mg Oral Daily   furosemide   20 mg Intravenous Q12H   heparin   5,000 Units Subcutaneous Q8H   ipratropium-albuterol   3 mL Nebulization BID   midodrine   10 mg Oral TID WC   nicotine   14 mg Transdermal Daily   pantoprazole   40 mg Oral Daily   sodium chloride  flush  3 mL Intravenous Q12H   sodium chloride  flush  3 mL Intravenous Q12H   tamsulosin  0.4 mg Oral QPC supper   Continuous Infusions:  sodium chloride      PRN Meds: sodium chloride , acetaminophen  **OR** acetaminophen , albuterol , bisacodyl, ondansetron  **OR** ondansetron  (ZOFRAN ) IV, polyethylene glycol, sodium chloride  flush, traZODone  Allergies:    Allergies  Allergen Reactions   Bee Pollen Other (See Comments)    Seasonal allergies    Social History:   Social History   Socioeconomic History   Marital status: Married    Spouse name: Not on file   Number of children: Not on file   Years of education: Not on file   Highest education level: Not on file  Occupational History   Not on file  Tobacco Use   Smoking status: Every Day   Smokeless tobacco: Never  Vaping Use  Vaping status: Never Used  Substance and Sexual Activity   Alcohol use: Yes    Comment: 1-3 beers per day   Drug use: Never   Sexual activity: Not on file  Other Topics Concern   Not on file  Social History Narrative   Not on file   Social Drivers of Health   Financial Resource Strain: Not on file  Food Insecurity: No Food Insecurity (08/14/2023)   Hunger Vital Sign    Worried About Running Out of Food in the Last Year: Never true    Ran Out of Food in the Last Year: Never true  Transportation Needs: No Transportation Needs (08/14/2023)   PRAPARE - Administrator, Civil Service (Medical): No    Lack of Transportation (Non-Medical): No  Physical Activity:  Not on file  Stress: Not on file  Social Connections: Unknown (08/14/2023)   Social Connection and Isolation Panel [NHANES]    Frequency of Communication with Friends and Family: Three times a week    Frequency of Social Gatherings with Friends and Family: Three times a week    Attends Religious Services: Patient declined    Active Member of Clubs or Organizations: Patient declined    Attends Banker Meetings: Never    Marital Status: Married  Catering manager Violence: Not At Risk (08/14/2023)   Humiliation, Afraid, Rape, and Kick questionnaire    Fear of Current or Ex-Partner: No    Emotionally Abused: No    Physically Abused: No    Sexually Abused: No    Family History:    Family History  Problem Relation Age of Onset   Heart disease Other        multiple family members he's not sure     ROS:  Please see the history of present illness.   All other ROS reviewed and negative.     Physical Exam/Data: Vitals:   08/15/23 0434 08/15/23 0457 08/15/23 0500 08/15/23 0802  BP: 102/80     Pulse: 75     Resp:      Temp:  97.9 F (36.6 C)    TempSrc:  Oral    SpO2: 98%   96%  Weight:   74.1 kg   Height:        Intake/Output Summary (Last 24 hours) at 08/15/2023 0844 Last data filed at 08/15/2023 0510 Gross per 24 hour  Intake --  Output 500 ml  Net -500 ml      08/15/2023    5:00 AM 08/13/2023    8:27 PM 08/10/2023    1:08 PM  Last 3 Weights  Weight (lbs) 163 lb 5.8 oz 164 lb 14.5 oz 165 lb  Weight (kg) 74.1 kg 74.8 kg 74.844 kg     Body mass index is 22.78 kg/m.  General:  Well nourished, well developed, in no acute distress HEENT: normal Neck: no JVD Vascular: No carotid bruits; Distal pulses 2+ bilaterally Cardiac:  RRR, 3/6 systolic murmur rusb Lungs:  clear to auscultation bilaterally, no wheezing, rhonchi or rales  Abd: coarse bilaterally Ext: no edema Musculoskeletal:  No deformities, BUE and BLE strength normal and equal Skin: warm and dry   Neuro:  somnolent, opens yes to sternal rubs. Does not answer questions.    Laboratory Data: High Sensitivity Troponin:   Recent Labs  Lab 08/14/23 0407 08/14/23 0647  TROPONINIHS 50* 48*     Chemistry Recent Labs  Lab 08/13/23 2034 08/15/23 0354  NA 140 138  K 3.4*  3.7  CL 98 98  CO2 29 30  GLUCOSE 120* 112*  BUN 30* 35*  CREATININE 1.53* 1.62*  CALCIUM  8.2* 8.0*  GFRNONAA 46* 43*  ANIONGAP 13 10    Recent Labs  Lab 08/13/23 2034  PROT 6.1*  ALBUMIN 2.7*  AST 43*  ALT 35  ALKPHOS 92  BILITOT 1.0   Lipids No results for input(s): "CHOL", "TRIG", "HDL", "LABVLDL", "LDLCALC", "CHOLHDL" in the last 168 hours.  Hematology Recent Labs  Lab 08/13/23 2034  WBC 6.6  RBC 4.83  HGB 13.3  HCT 41.3  MCV 85.5  MCH 27.5  MCHC 32.2  RDW 15.9*  PLT 243   Thyroid  No results for input(s): "TSH", "FREET4" in the last 168 hours.  BNP Recent Labs  Lab 08/14/23 0407  BNP >4,500.0*    DDimer No results for input(s): "DDIMER" in the last 168 hours.  Radiology/Studies:  DG Chest Port 1 View Result Date: 08/14/2023 EXAM: 1 VIEW XRAY OF THE CHEST 08/14/2023 12:55:00 AM COMPARISON: 12/08/2022 CLINICAL HISTORY: SOB. Pt BIB EMS from home with c/o weakness all week, wife states that she is worried he has been having TIAs all day. Per EMS, pt became more weak and pale when they attempted to stand him for orthostatic. Has left sided facial droop from previous stroke. Hx of asthma, CAD, hypertension. Current everyday smoker. FINDINGS: LUNGS AND PLEURA: Left lower lobe opacity, likely atelectasis. Moderate left pleural effusion, new/progressive. HEART AND MEDIASTINUM: Mild cardiomegaly. Thoracic aortic atherosclerosis. BONES AND SOFT TISSUES: No acute osseous abnormality. IMPRESSION: 1. Moderate left pleural effusion, new/progressive. 2. Mild cardiomegaly. Electronically signed by: Zadie Herter MD 08/14/2023 01:00 AM EDT RP Workstation: [id number]   CT Head Wo Contrast Result  Date: 08/13/2023 EXAM: CT HEAD WITHOUT 08/13/2023 09:56:32 PM TECHNIQUE: CT of the head was performed without the administration of intravenous contrast. Automated exposure control, iterative reconstruction, and/or weight based adjustment of the mA/kV was utilized to reduce the radiation dose to as low as reasonably achievable. COMPARISON: 08/22/2017 CLINICAL HISTORY: Memory loss. c/o weakness all week, wife states that she is worried he has been having TIAs all day. Per ems, pt became more weak and pale when they attempted to stand him for orthostatic. Has left sided facial droop from previous stroke. FINDINGS: BRAIN AND VENTRICLES: Global cortical atrophy. Subcortical and periventricular small vessel ischemic changes. There is no acute intracranial hemorrhage, mass effect or midline shift. No abnormal extra-axial fluid collection. The gray-white differentiation is maintained without evidence of an acute infarct. There is no evidence of hydrocephalus. ORBITS: The visualized portion of the orbits demonstrate no acute abnormality. SINUSES: The visualized paranasal sinuses and mastoid air cells demonstrate no acute abnormality. SOFT TISSUES AND SKULL: No acute abnormality of the visualized skull or soft tissues. IMPRESSION: 1. No acute intracranial abnormality. Electronically signed by: Zadie Herter MD 08/13/2023 10:01 PM EDT RP Workstation: [id number]     Assessment and Plan: 1.Acute on chronic HFrEF - Echo 11/2022: LVE 25-30%, grade III dd, severe LAE, severe AS mean grad 28, AVA VTI 0.61, DI 0.21  - CXR: moderate left pleural effusion, BNP >4500 - repeat echo would not affect management.   - received IV lasix  60mg  yesterday, scehduled for 20mg  bid today.  - from notes medical therapy has been limited by hypotension. AM dose held due to soft bp's. I/Os incomplete. Mild uptrend in Cr.  - diurese as best we can to help symptoms, on midodrine  to support bp's with diuresis. Management is purely  palliative, poor  prognosis from natural progression of of severe aortic stenosis with related heart failure not a candidate for valvular intervention.     2.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  - presents volume overloaded - can diurese best we can for symptoms but overall poor short term prognosis, needs ongoing palliative care evaluation.  3. Pleural effusion - thoracentesis planned per primary team   4. DNR/DNI   5. AMS - somnolent this morning, primary team made aware - from cardiac standpoint repeat vitals, check lactic acid.     For questions or updates, please contact [facility] Please consult [email or web address] for contact info under    Signed, Armida Lander, MD  08/15/2023 8:44 AM

## 2023-08-15 NOTE — Plan of Care (Signed)

## 2023-08-15 NOTE — TOC CM/SW Note (Signed)
 Transition of Care [facility]) - Inpatient Brief Assessment   Patient Details  Name: Tommy Knight MRN: [id number] Date of Birth: Sep 16, 1945  Transition [facility] Care [facility] CM/SW Contact:    Cyndie Dredge, LCSWA Phone Number: 08/15/2023, 1:36 PM   Clinical Narrative:  Transition of Care Department [facility]) has reviewed patient and no TOC needs have been identified at this time. We will continue to monitor patient advancement through interdisciplinary progression rounds. If new patient transition needs arise, please place a TOC consult.   Transition of Care Asessment: Insurance and Status: Insurance coverage has been reviewed Patient has primary care physician: Yes Home environment has been reviewed: Single Family Home with spouse Prior level of function:: Spouse assist with patient care Prior/Current Home Services: No current home services Social Drivers of Health Review: SDOH reviewed no interventions necessary Readmission risk has been reviewed: Yes Transition of care needs: transition of care needs identified, TOC will continue to follow

## 2023-08-15 NOTE — Plan of Care (Signed)

## 2023-08-15 NOTE — Progress Notes (Signed)
 Patient refusing foley catheter at this time, MD courage aware.

## 2023-08-15 NOTE — Progress Notes (Signed)
 Date and time results received: 08/15/23 0950  Test: Pc02 Critical Value: 34  Name of Provider Notified: MD Courage   Awaiting orders.

## 2023-08-15 NOTE — Procedures (Signed)
 Ultrasound-guided diagnostic and therapeutic left sided thoracentesis performed yielding 1.4 liters of amber colored fluid. No immediate complications.   Diagnostic fluid was sent to the lab for further analysis. Follow-up chest x-ray pending. EBL is < 2 ml. Patient unable to tolerate additional fluid removal.

## 2023-08-15 NOTE — Progress Notes (Signed)
 Left Thoracentesis procedure complete at this time and 1.4 Liters of pleural fluid removed and sent to lab for processing. Patient verbalized understanding of post procedure instructions and transported via stretcher to xray at this time for post chest xray. Assigned inpatient nurse made aware of patient's blood pressure readings during procedure. Patient had no acute distress noted at completion of procedure.

## 2023-08-15 NOTE — Progress Notes (Signed)
 PROGRESS NOTE  Tommy Knight, is a 78 y.o. male, DOB - [birth year], JOA:[id number]  Admit date - 08/13/2023   Admitting Physician Forest Redwine Quintella Buck, MD  Outpatient Primary MD for the patient is [name], NP  LOS - 1  Chief Complaint  Patient presents with   Weakness    Brief Narrative:   78 y.o. 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 - complicated by severe low flow low gradient AS - Echo 11/2022: LVE 25-30%, grade III dd, severe LAE, severe AS mean grad 28, AVA VTI 0.61, DI 0.21  - On admission CXR left effusion, BNP >4500 -Patient received IV Lasix  in the ED with good urine output but then blood pressure became soft - Soft blood pressure complicates attempts at diuresis or other cardiac Meds (he cannot tolerate GDMT as he is preload dependent) - Strict I's and O's -Daily weight -REDs Clip 08/15/23 -Cardiology consult appreciated -S/p Left-sided thoracentesis on 08/15/2023 with removal of 1.4 L--fluid analysis consistent with transudate--pleural effusion due to CHF --dose IV Lasix  as BP allows--diuresis should help dyspnea - Patient transitioning to comfort care/hospice as of 08/15/2023   2)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 -Primary cardiologist previously recommended palliative approach - Diuresis as above Patient transitioning to comfort  care/hospice as of 08/15/2023   3) COPD/paraseptal emphysema--No acute exacerbation at this time  - Avoid steroids due to risk of fluid retention  - Bronchodilators as ordered  Patient transitioning to comfort care/hospice as of 08/15/2023   4)CKD stage -3A -Creatinine 1.62 which is close to prior baseline -No metabolic acidosis -Patient transitioning to comfort care/hospice as of 08/15/2023   5)Prior CVA 2019 -was on Atorvastatin , aspirin  and Plavix  for secondary prevention Patient transitioning to comfort care/hospice as of 08/15/2023   6) BPH urinary retention--reluctantly try Flomax with concerns for possible Flomax induced orthostatic issues - In and out catheterization PRN Patient transitioning to comfort care/hospice as of 08/15/2023   7)GERD--- continue Protonix    8) acute hypoxic respiratory failure-- -oxygen weaned down to 2 L of oxygen via nasal cannula -ABG with compensated hypercapnia Patient transitioning to comfort care/hospice as of 08/15/2023   9)Social/ethics--- plan of care and advanced directive discussed with patient, patient's daughter and wife at bedside - He is a DNR --Palliative care consult appreciated - Patient transitioning to comfort care/hospice as of 08/15/2023  10) acute metabolic encephalopathy/lethargy--- patient with intermittent episodes of lethargy and unresponsiveness -Probably metabolic -ABG reflects compensated hypercapnia without acidosis Patient transitioning to comfort care/hospice as of 08/15/2023  Status is: Inpatient   Disposition: The patient is from: Home              Anticipated d/c is to: Home with Hospice              Anticipated d/c date is: 1 day              Patient currently is not medically stable to d/c. Barriers: Not  Clinically Stable-   Code Status :  -  Code Status: Do not attempt resuscitation (DNR) - Comfort care   Family Communication:    (patient is alert, awake and coherent)  Wife and daughter   DVT Prophylaxis  :   -  SCDs   SCDs Start: 08/14/23 0959 Place TED hose Start: 08/14/23 0959   Lab Results  Component Value Date   PLT 243 08/13/2023    Inpatient Medications  Scheduled Meds:  budesonide  (PULMICORT ) nebulizer solution  0.25 mg Nebulization BID   clopidogrel   75 mg Oral Daily   famotidine   20 mg Oral Daily   furosemide   20 mg Intravenous Q12H   ipratropium-albuterol   3 mL Nebulization BID   midodrine   10 mg Oral TID WC   nicotine   14 mg Transdermal Daily   pantoprazole   40 mg Oral Daily   sodium chloride  flush  3 mL Intravenous Q12H   sodium chloride  flush  3 mL Intravenous Q12H   tamsulosin  0.4 mg Oral QPC supper   Continuous Infusions: PRN Meds:.acetaminophen  **OR** acetaminophen , albuterol , antiseptic oral rinse, artificial tears, bisacodyl, fentaNYL  (SUBLIMAZE ) injection, glycopyrrolate **OR** glycopyrrolate **OR** glycopyrrolate, haloperidol **OR** haloperidol **OR** haloperidol lactate, ondansetron  **OR** ondansetron  (ZOFRAN ) IV, polyethylene glycol, sodium chloride  flush, traZODone   Anti-infectives (From admission, onward)    None         Subjective: Tommy Knight today has no fevers, no emesis,  No chest pain,    - Episodes of lethargy and unresponsiveness from time to time Patient transitioning to comfort care/hospice as of 08/15/2023  Objective: Vitals:   08/15/23 1115 08/15/23 1130 08/15/23 1200 08/15/23 1356  BP: (!) 82/58 (!) 78/59 93/69 97/73   Pulse:  68 74 74  Resp: 20 20  17   Temp: 98 F (36.7 C)   97.9 F (36.6 C)  TempSrc: Oral   Oral  SpO2: 98% 97% 91% 99%  Weight:      Height:        Intake/Output Summary (Last 24 hours) at 08/15/2023 1723 Last data filed at 08/15/2023 1410 Gross per 24 hour  Intake 240 ml  Output 500 ml  Net -260 ml   Filed Weights   08/13/23 2027 08/15/23 0500  Weight: 74.8 kg 74.1 kg    Physical Exam  Gen:-More awake now was very sleepy earlier HEENT:- [place].AT, No sclera icterus Nose- [place] 2L/min Neck-Supple Neck,No JVD,.   Lungs-diminished breath sounds, no wheezing  CV- S1, S2 normal, regular  Abd-  +ve B.Sounds, Abd Soft, No tenderness,    Extremity/Skin:- No  edema, pedal pulses present  Psych-affect is appropriate, oriented x3, current episodes of lethargy and unresponsiveness Neuro-generalized weakness, no new focal deficits, no tremors  Data Reviewed: I have personally reviewed following labs and imaging studies  CBC: Recent Labs  Lab 08/13/23 2034  WBC 6.6  NEUTROABS 4.9  HGB 13.3  HCT 41.3  MCV 85.5  PLT 243   Basic Metabolic Panel: Recent Labs  Lab 08/13/23 2034 08/15/23 0354  NA 140 138  K 3.4* 3.7  CL 98 98  CO2 29 30  GLUCOSE 120* 112*  BUN 30* 35*  CREATININE 1.53* 1.62*  CALCIUM  8.2* 8.0*   GFR: Estimated Creatinine Clearance: 38.8 mL/min (A) (by C-G formula based on SCr of 1.62 mg/dL (H)). Liver Function Tests: Recent Labs  Lab 08/13/23 2034  AST 43*  ALT 35  ALKPHOS 92  BILITOT 1.0  PROT 6.1*  ALBUMIN 2.7*   Recent Results (from the past 240  hours)  Gram stain     Status: None   Collection Time: 08/15/23 11:20 AM   Specimen: Pleura  Result Value Ref Range Status   Specimen Description PLEURAL  Final   Special Requests PLEURAL  Final   Gram Stain   Final    NO ORGANISMS SEEN WBC PRESENT, PREDOMINANTLY MONONUCLEAR CYTOSPIN SMEAR Performed at [facility], [address] [place], [place] [address]    Report Status 08/15/2023 FINAL  Final    Radiology Studies: DG Chest 1 View Result Date: 08/15/2023 CLINICAL DATA:  Post thoracentesis EXAM: CHEST  1 VIEW COMPARISON:  08/14/2023 FINDINGS: Moderate left pleural effusion, decreased since prior study. No pneumothorax. Left base atelectasis. Mild cardiomegaly and vascular congestion. IMPRESSION: Decreasing left pleural effusion following thoracentesis. No pneumothorax. Left base atelectasis. Cardiomegaly, vascular congestion. Electronically Signed   By: Janeece Mechanic M.D.   On: 08/15/2023 12:43   US  THORACENTESIS  ASP PLEURAL SPACE W/IMG GUIDE Result Date: 08/15/2023 INDICATION: 78 year old male. History of COPD and CHF. Admitted for altered mental status. Found to be hypoxic with a left-sided pleural effusion. Request is for therapeutic and diagnostic left-sided thoracentesis EXAM: ULTRASOUND GUIDED THERAPEUTIC AND DIAGNOSTIC LEFT-SIDED THORACENTESIS MEDICATIONS: Lidocaine  1% 10 mL COMPLICATIONS: None immediate. PROCEDURE: An ultrasound guided thoracentesis was thoroughly discussed with the patient and questions answered. The benefits, risks, alternatives and complications were also discussed. The patient understands and wishes to proceed with the procedure. Written consent was obtained. Ultrasound was performed to localize and mark an adequate pocket of fluid in the left chest. The area was then prepped and draped in the normal sterile fashion. 1% Lidocaine  was used for local anesthesia. Under ultrasound guidance a 8 Fr Safe-T-Centesis catheter was introduced. Thoracentesis was performed. The catheter was removed and a dressing applied. FINDINGS: A total of approximately 1.4 L of amber colored fluid was removed. Samples were sent to the laboratory as requested by the clinical team. Patient able to tolerate additional fluid removal at this time. IMPRESSION: Successful ultrasound guided left-sided therapeutic and diagnostic thoracentesis yielding 1.4 L of amber colored pleural fluid. Performed by Reagan Camera NP Electronically Signed   By: Fernando Hoyer M.D.   On: 08/15/2023 12:35   DG Chest Port 1 View Result Date: 08/14/2023 EXAM: 1 VIEW XRAY OF THE CHEST 08/14/2023 12:55:00 AM COMPARISON: 12/08/2022 CLINICAL HISTORY: SOB. Pt BIB EMS from home with c/o weakness all week, wife states that she is worried he has been having TIAs all day. Per EMS, pt became more weak and pale when they attempted to stand him for orthostatic. Has left sided facial droop from previous stroke. Hx of asthma, CAD, hypertension. Current  everyday smoker. FINDINGS: LUNGS AND PLEURA: Left lower lobe opacity, likely atelectasis. Moderate left pleural effusion, new/progressive. HEART AND MEDIASTINUM: Mild cardiomegaly. Thoracic aortic atherosclerosis. BONES AND SOFT TISSUES: No acute osseous abnormality. IMPRESSION: 1. Moderate left pleural effusion, new/progressive. 2. Mild cardiomegaly. Electronically signed by: Zadie Herter MD 08/14/2023 01:00 AM EDT RP Workstation: [id number]   CT Head Wo Contrast Result Date: 08/13/2023 EXAM: CT HEAD WITHOUT 08/13/2023 09:56:32 PM TECHNIQUE: CT of the head was performed without the administration of intravenous contrast. Automated exposure control, iterative reconstruction, and/or weight based adjustment of the mA/kV was utilized to reduce the radiation dose to as low as reasonably achievable. COMPARISON: 08/22/2017 CLINICAL HISTORY: Memory loss. c/o weakness all week, wife states that she is worried he has been having TIAs all day. Per ems, pt became more weak and pale when they attempted to  stand him for orthostatic. Has left sided facial droop from previous stroke. FINDINGS: BRAIN AND VENTRICLES: Global cortical atrophy. Subcortical and periventricular small vessel ischemic changes. There is no acute intracranial hemorrhage, mass effect or midline shift. No abnormal extra-axial fluid collection. The gray-white differentiation is maintained without evidence of an acute infarct. There is no evidence of hydrocephalus. ORBITS: The visualized portion of the orbits demonstrate no acute abnormality. SINUSES: The visualized paranasal sinuses and mastoid air cells demonstrate no acute abnormality. SOFT TISSUES AND SKULL: No acute abnormality of the visualized skull or soft tissues. IMPRESSION: 1. No acute intracranial abnormality. Electronically signed by: Sriyesh Krishnan MD 08/13/2023 10:01 PM EDT RP Workstation: [id number]   Scheduled Meds:  budesonide  (PULMICORT ) nebulizer solution  0.25 mg Nebulization BID    clopidogrel   75 mg Oral Daily   famotidine   20 mg Oral Daily   furosemide   20 mg Intravenous Q12H   ipratropium-albuterol   3 mL Nebulization BID   midodrine   10 mg Oral TID WC   nicotine   14 mg Transdermal Daily   pantoprazole   40 mg Oral Daily   sodium chloride  flush  3 mL Intravenous Q12H   sodium chloride  flush  3 mL Intravenous Q12H   tamsulosin  0.4 mg Oral QPC supper   Continuous Infusions:   LOS: 1 day    Colin Dawley M.D on 08/15/2023 at 5:23 PM  Go to [email or web address] - for contact info  [facility] - Office  [phone]  If 7PM-7AM, please contact night-coverage [email or web address] 08/15/2023, 5:23 PM

## 2023-08-15 NOTE — Consult Note (Signed)
 Consultation Note Date: 08/15/2023   Patient Name: Tommy Knight  DOB: 1946-03-15  MRN: [id number]  Age / Sex: 78 y.o., male  PCP: [name], NP Referring Physician: Colin Dawley, MD  Reason for Consultation: Establishing goals of care  HPI/Patient Profile: 78 y.o. 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 Found to have acute on chronic combined systolic and diastolic heart failure  Clinical Assessment and Goals of Care: Consult received and chart review completed. Discussed with Dr. Quintella Buck. I met today with Mr. Gutzmer and he is minimally responsive. He does not open his eyes or respond verbally. He does follow some simple commands like squeeze my hands and wiggle your toes.   I returned to bedside to speak with wife, [name]. [name] and I have a discussion of her husband's condition and complication with significant heart failure complicated by low BP and kidney failure. We reviewed the energy needed for his heart to continue to try and pump. We reviewed his decline with sleeping a lot and poor intake. [name] was able to get her daughter Urban Garden over phone to discuss as well. I discussed the challenges ahead and that I fear these are issues that will only get worse and issues we cannot fix. I discussed with them the importance of considering how Mr. Zeiss would want to spend the time he has left. We discussed a focus on comfort care would be appropriate and recommended. [name] and Charlene tearfully share that they agree. [name] looks to [place] for guidance and reassurance. Urban Garden will be coming to the hospital to support her mother.   Mr. Yearwood returns and I spend time providing support to [name] and  attempting to discuss with Mr. Hoak. He is awake and able to converse but does not seem to understand or have insight into the severity of his condition. I left them to visit.   Update: I returned to bedside to discuss with [name] and Charlene in person at bedside. Charlene's husband is also present. Mr. Cabiness is less responsive again and does not awaken during this visit to voice or gentle stimuli. I discussed with family comfort care. We discussed focus on symptom management and not treating numbers. We discussed hospice care and options. [name] initially shares that she would want to take Mr. Meleski home. Once we discussed expectations and poor prognosis family have decided they are more interested in pursuing hospice facility. We discussed prognosis as ~1 week and anticipated trajectory of ongoing decline and need for symptom management.   All questions/concerns addressed. Emotional support provided.   Primary Decision Maker NEXT OF KIN wife with support from daughter    SUMMARY OF RECOMMENDATIONS   - DNR - Comfort care - Hopeful for transition to hospice facility  Code Status/Advance Care Planning: DNR   Symptom Management:  Continue some meds while hospitalized as  able to take.  PRNs available for comfort.   Prognosis:  Days to weeks.   Discharge Planning: Hospice facility      Primary Diagnoses: Present on Admission:  Tobacco use disorder  Pleural effusion on left  CKD (chronic kidney disease) stage 3, GFR 30-59 ml/min (HCC)  BPH (benign prostatic hyperplasia)  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) heart failure (HCC)   I have reviewed the medical record, interviewed the patient and family, and examined the patient. The following aspects are pertinent.  Past Medical History:  Diagnosis Date   Allergic rhinitis    Asthma    Carotid artery disease (HCC)    COPD (chronic obstructive pulmonary disease) (HCC)    Hyperlipidemia    Hypertension    Stenosis of left  subclavian artery (HCC)    Stroke [facility])    Social History   Socioeconomic History   Marital status: Married    Spouse name: Not on file   Number of children: Not on file   Years of education: Not on file   Highest education level: Not on file  Occupational History   Not on file  Tobacco Use   Smoking status: Every Day   Smokeless tobacco: Never  Vaping Use   Vaping status: Never Used  Substance and Sexual Activity   Alcohol use: Yes    Comment: 1-3 beers per day   Drug use: Never   Sexual activity: Not on file  Other Topics Concern   Not on file  Social History Narrative   Not on file   Social Drivers of Health   Financial Resource Strain: Not on file  Food Insecurity: No Food Insecurity (08/14/2023)   Hunger Vital Sign    Worried About Running Out of Food in the Last Year: Never true    Ran Out of Food in the Last Year: Never true  Transportation Needs: No Transportation Needs (08/14/2023)   PRAPARE - Administrator, Civil Service (Medical): No    Lack of Transportation (Non-Medical): No  Physical Activity: Not on file  Stress: Not on file  Social Connections: Unknown (08/14/2023)   Social Connection and Isolation Panel [NHANES]    Frequency of Communication with Friends and Family: Three times a week    Frequency of Social Gatherings with Friends and Family: Three times a week    Attends Religious Services: Patient declined    Active Member of Clubs or Organizations: Patient declined    Attends Banker Meetings: Never    Marital Status: Married   Family History  Problem Relation Age of Onset   Heart disease Other        multiple family members he's not sure   Scheduled Meds:  aspirin  EC  81 mg Oral Q breakfast   atorvastatin   40 mg Oral QPM   budesonide  (PULMICORT ) nebulizer solution  0.25 mg Nebulization BID   clopidogrel   75 mg Oral Daily   famotidine   20 mg Oral Daily   furosemide   20 mg Intravenous Q12H   heparin   5,000 Units  Subcutaneous Q8H   ipratropium-albuterol   3 mL Nebulization BID   midodrine   10 mg Oral TID WC   nicotine   14 mg Transdermal Daily   pantoprazole   40 mg Oral Daily   sodium chloride  flush  3 mL Intravenous Q12H   sodium chloride  flush  3 mL Intravenous Q12H   tamsulosin  0.4 mg Oral QPC supper   Continuous Infusions:  sodium chloride   PRN Meds:.sodium chloride , acetaminophen  **OR** acetaminophen , albuterol , bisacodyl, ondansetron  **OR** ondansetron  (ZOFRAN ) IV, polyethylene glycol, sodium chloride  flush, traZODone Allergies  Allergen Reactions   Bee Pollen Other (See Comments)    Seasonal allergies   Review of Systems  Unable to perform ROS: Acuity of condition    Physical Exam Vitals and nursing note reviewed.  Constitutional:      General: He is not in acute distress.    Appearance: He is ill-appearing.  Cardiovascular:     Rate and Rhythm: Normal rate.  Pulmonary:     Effort: No tachypnea, accessory muscle usage or respiratory distress.  Abdominal:     Palpations: Abdomen is soft.  Neurological:     Comments: Mental status with significant changes and shifts.      Vital Signs: BP 102/80 (BP Location: Left Arm)   Pulse 75   Temp 97.9 F (36.6 C) (Oral)   Resp 20   Ht 5\' 11"  (1.803 m)   Wt 74.1 kg   SpO2 96%   BMI 22.78 kg/m  Pain Scale: 0-10   Pain Score: 0-No pain   SpO2: SpO2: 96 % O2 Device:SpO2: 96 % O2 Flow Rate: .O2 Flow Rate (L/min): 3 L/min  IO: Intake/output summary:  Intake/Output Summary (Last 24 hours) at 08/15/2023 7846 Last data filed at 08/15/2023 0510 Gross per 24 hour  Intake --  Output 500 ml  Net -500 ml    LBM: Last BM Date : 08/13/23 Baseline Weight: Weight: 74.8 kg Most recent weight: Weight: 74.1 kg     Palliative Assessment/Data:    Time Total: 100 min  Greater than 50%  of this time was spent counseling and coordinating care related to the above assessment and plan.  Signed by: Vila Grayer, NP Palliative  Medicine Team Pager # [phone] (M-F 8a-5p) Team Phone # [phone] (Nights/Weekends)

## 2023-08-16 DIAGNOSIS — Z7189 Other specified counseling: Secondary | ICD-10-CM | POA: Diagnosis not present

## 2023-08-16 DIAGNOSIS — Z515 Encounter for palliative care: Secondary | ICD-10-CM | POA: Diagnosis not present

## 2023-08-16 DIAGNOSIS — I5023 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) heart failure: Secondary | ICD-10-CM | POA: Diagnosis not present

## 2023-08-16 MED ORDER — MIDODRINE HCL 10 MG PO TABS: 10.0000 mg | ORAL_TABLET | Freq: Three times a day (TID) | ORAL | 5 refills | Status: DC

## 2023-08-16 MED ORDER — NICOTINE 14 MG/24HR TD PT24: 14.0000 mg | MEDICATED_PATCH | Freq: Every day | TRANSDERMAL | 0 refills | Status: DC

## 2023-08-16 MED ORDER — IPRATROPIUM-ALBUTEROL 0.5-2.5 (3) MG/3ML IN SOLN: 3.0000 mL | Freq: Four times a day (QID) | RESPIRATORY_TRACT | 5 refills | Status: DC | PRN

## 2023-08-16 MED ORDER — CLOPIDOGREL BISULFATE 75 MG PO TABS: 75.0000 mg | ORAL_TABLET | Freq: Every day | ORAL | 5 refills | Status: DC

## 2023-08-16 MED ORDER — ASPIRIN 81 MG PO TBEC: 81.0000 mg | DELAYED_RELEASE_TABLET | Freq: Every day | ORAL | 11 refills | Status: DC

## 2023-08-16 MED ORDER — BUDESONIDE 0.25 MG/2ML IN SUSP: RESPIRATORY_TRACT | 12 refills | Status: DC

## 2023-08-16 MED ORDER — TAMSULOSIN HCL 0.4 MG PO CAPS: 0.4000 mg | ORAL_CAPSULE | Freq: Every day | ORAL | 2 refills | Status: DC

## 2023-08-16 MED ORDER — ALBUTEROL SULFATE HFA 108 (90 BASE) MCG/ACT IN AERS: 2.0000 | INHALATION_SPRAY | RESPIRATORY_TRACT | 2 refills | Status: DC | PRN

## 2023-08-16 MED ORDER — FUROSEMIDE 20 MG PO TABS: 20.0000 mg | ORAL_TABLET | Freq: Two times a day (BID) | ORAL | 5 refills | Status: DC

## 2023-08-16 MED ORDER — FAMOTIDINE 20 MG PO TABS: 20.0000 mg | ORAL_TABLET | Freq: Every day | ORAL | 4 refills | Status: DC

## 2023-08-16 MED ORDER — MORPHINE SULFATE (CONCENTRATE) 10 MG /0.5 ML PO SOLN: 2.5000 mg | ORAL | Status: DC | PRN

## 2023-08-16 NOTE — Progress Notes (Signed)
 Pt discharged home with hospice. All personal belonging taken by pt. Transported by [employer].

## 2023-08-16 NOTE — Progress Notes (Signed)
 From primary team note patient transitioning to comfort care, we will sign off inpatient care  Tommy Raw MD

## 2023-08-16 NOTE — Plan of Care (Signed)

## 2023-08-16 NOTE — Discharge Summary (Signed)
 Tommy Knight, is a 78 y.o. male  DOB 12-10-1945  MRN [id number].  Admission date:  08/13/2023  Admitting Physician  Colin Dawley, MD  Discharge Date:  08/16/2023   Primary MD  [name], NP  Recommendations for primary care physician for things to follow:  1)Very Low-salt diet advised---Less than 2 gm of Sodium per day advised----ok to use Mrs DASH salt substitute instead of Salt 2)Weigh yourself daily, call if you gain more than 3 pounds in 1 day or more than 5 pounds in 1 week as your diuretic medications may need to be adjusted 3)Limit your Fluid  intake to no more than 60 ounces (1.8 Liters) per day 4)follow up with Hospice Team   Admission Diagnosis  Acute exacerbation of CHF (congestive heart failure) (HCC) [I50.9] Pleural effusion on left [J90] Congestive heart failure, unspecified HF chronicity, unspecified heart failure type (HCC) [I50.9]   Discharge Diagnosis  Acute exacerbation of CHF (congestive heart failure) (HCC) [I50.9] Pleural effusion on left [J90] Congestive heart failure, unspecified HF chronicity, unspecified heart failure type (HCC) [I50.9]    Principal Problem:   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) heart failure (HCC) Active Problems:   Severe aortic stenosis   Nonischemic cardiomyopathy (HCC)   Pleural effusion on left   Tobacco use disorder   CKD (chronic kidney disease) stage 3, GFR 30-59 ml/min (HCC)   BPH (benign prostatic hyperplasia)      Past Medical History:  Diagnosis Date   Allergic rhinitis    Asthma    Carotid artery disease (HCC)    COPD (chronic obstructive pulmonary disease) (HCC)    Hyperlipidemia    Hypertension    Stenosis of left subclavian artery (HCC)    Stroke [facility])    Past Surgical History:  Procedure Laterality Date   APPENDECTOMY     RIGHT/LEFT HEART CATH AND CORONARY ANGIOGRAPHY N/A 12/02/2022   Procedure: RIGHT/LEFT HEART CATH  AND CORONARY ANGIOGRAPHY;  Surgeon: Kyra Phy, MD;  Location: [facility] INVASIVE CV LAB;  Service: Cardiovascular;  Laterality: N/A;     HPI  from the history and physical done on the day of admission:  HPI: Tommy Knight is a 78 y.o. 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 - Additional history obtained from patient's wife and daughter at bedside No fever  Or chills  - He does have some cough, mostly nonproductive - Complains of generalized weakness and fatigue and concerns about lethargy and some confusional episodes - Patient received IV Lasix  in the ED with good urine output but then blood pressure became soft - In the ED patient was found to be hypoxic and placed on up to 2 L of oxygen --Patient denies frank chest pains,  leg swelling noted but no pleuritic symptoms In ED  -moderate left-sided effusion on chest x-ray noted Troponin 50 >>48 , EKG shows sinus rhythm with LVH and repolarization abnormalities BNP >>>4,500 - PH is  7.4 on VBG - UA largely unremarkable - CBC largely unremarkable - Sodium is 140, potassium 3.4, creatinine 1.53 which is actually better than recent baseline, bicarb is 29  - LFTs with AST of 43 otherwise not elevated   Review of Systems: As mentioned in the history of present illness. All other systems reviewed and are negative.   Hospital Course:     Brief Narrative:   78 y.o. male with medical history significant significant for tobacco use, COPD/emphysema, known history of CVA 2019, HLD, GERD ,COPD, non- ischemic cardiomyopathy with  combined diastolic (GD3) and systolic dysfunction CHF with EF  in the 25 to 30% range, h/o severe AoS not TAVR candidate EF 25-30% and PAD admitted with acute on chronic combined systolic and diastolic CHF exacerbation with altered mentation/lethargy. Patient transitioning to comfort care/hospice as of 08/15/2023   -Assessment and Plan: 1)Acute on chronic combined systolic and diastolic dysfunction CHF-NICM - complicated by severe low flow low gradient AS - Echo 11/2022: LVE 25-30%, grade III dd, severe LAE, severe AS mean grad 28, AVA VTI 0.61, DI 0.21  - On admission CXR left effusion, BNP >4500 -Patient received IV Lasix  in the ED with good urine output but then blood pressure became soft - Soft blood pressure complicates attempts at diuresis or other cardiac Meds (he cannot tolerate GDMT as he is preload dependent) -Cardiology consult appreciated -S/p Left-sided thoracentesis on 08/15/2023 with removal of 1.4 L--fluid analysis consistent with transudate--pleural effusion due to CHF - Soft BP limits ability to titrate up Lasix  - Patient transitioned to comfort care/hospice as of 08/15/2023 --Lasix  should help with dyspnea -Discharge home on 08/16/23 with hospice at home   2)Severe low flow low gradient aortic stenosis - Previously evaluated by multidiscplinary team, not a TAVR candidate due to severe PAD and no access point. -Primary cardiologist previously recommended palliative approach - Diuresis as above Patient transitioned to comfort care/hospice as of 08/15/2023 --Discharge home on 08/16/23 with hospice at home   3) COPD/paraseptal emphysema--No acute exacerbation at this time  - Avoid steroids due to risk of fluid retention  - Bronchodilators as ordered  -Patient transitioned to comfort care/hospice as of 08/15/2023 --Discharge home on 08/16/23 with hospice at home   4)CKD stage -3A -Creatinine 1.62 which is close to prior baseline -No metabolic acidosis -Patient transitioning to comfort care/hospice as of 08/15/2023   5)Prior CVA 2019 -was on  Atorvastatin , aspirin  and Plavix  for secondary prevention - Stop atorvastatin , continue aspirin  and Plavix  -Patient transitioned to comfort care/hospice as of 08/15/2023 --Discharge home on 08/16/23 with hospice at home   6) BPH urinary retention--reluctantly try Flomax with concerns for possible Flomax induced orthostatic issues Patient transitioned to comfort care/hospice as of 08/15/2023 --Discharge home on 08/16/23 with hospice at home   7)GERD--- continue Protonix    8) acute hypoxic respiratory failure-- -hypoxia resolved, patient is currently on room air -ABG with compensated hypercapnia Patient transitioned to comfort care/hospice as of 08/15/2023 --Discharge home on 08/16/23 with hospice at home   9)Social/ethics--- plan of care and advanced directive discussed with patient, patient's daughter and wife at bedside - He is a DNR --Palliative care consult appreciated Patient transitioned to comfort care/hospice as of 08/15/2023 --Discharge home on 08/16/23 with hospice at home   10) acute metabolic encephalopathy/lethargy---  patient with intermittent episodes of lethargy and unresponsiveness -Probably metabolic -ABG reflects compensated hypercapnia without acidosis -As per Family members mentation appears to be back to baseline -Patient transitioned to comfort care/hospice as of 08/15/2023 --Discharge home on 08/16/23 with hospice at home   Disposition: The patient is from: Home              Anticipated d/c is to: Home with Hospice  Discharge Condition: stable, poor prognosis  Follow UP--hospice   Consults obtained - Palliative and Hospice  Discharge Instructions    Discharge Instructions     Call MD for:  difficulty breathing, headache or visual disturbances   Complete by: As directed    Call MD for:  persistant dizziness or light-headedness   Complete by: As directed    Call MD for:  persistant nausea and vomiting   Complete by: As directed    Call MD for:  temperature >100.4    Complete by: As directed    Diet - low sodium heart healthy   Complete by: As directed    Discharge instructions   Complete by: As directed    1)Very Low-salt diet advised---Less than 2 gm of Sodium per day advised----ok to use Mrs DASH salt substitute instead of Salt 2)Weigh yourself daily, call if you gain more than 3 pounds in 1 day or more than 5 pounds in 1 week as your diuretic medications may need to be adjusted 3)Limit your Fluid  intake to no more than 60 ounces (1.8 Liters) per day 4)follow up with Hospice Team   Increase activity slowly   Complete by: As directed        Discharge Medications     Allergies as of 08/16/2023       Reactions   Grass Pollen([id number] Vern)    Seasonal allergy        Medication List     STOP taking these medications    atorvastatin  40 MG tablet Commonly known as: LIPITOR   omeprazole 20 MG capsule Commonly known as: PRILOSEC       TAKE these medications    acetaminophen  325 MG tablet Commonly known as: TYLENOL  Take 650 mg by mouth every 6 (six) hours as needed for headache.   albuterol  108 (90 Base) MCG/ACT inhaler Commonly known as: VENTOLIN  HFA Inhale 2 puffs into the lungs every 2 (two) hours as needed for wheezing or shortness of breath.   aspirin  EC 81 MG tablet Take 1 tablet (81 mg total) by mouth daily with breakfast. Swallow whole. What changed: when to take this   budesonide  0.25 MG/2ML nebulizer solution Commonly known as: Pulmicort  One twice daily with duoneb   Centrum Silver 50+Men Tabs Take 1 tablet by mouth daily.   PreserVision AREDS 2 Caps Take 1 capsule by mouth 2 (two) times daily.   clopidogrel  75 MG tablet Commonly known as: PLAVIX  Take 1 tablet (75 mg total) by mouth daily.   famotidine  20 MG tablet Commonly known as: Pepcid  Take 1 tablet (20 mg total) by mouth daily.   furosemide  20 MG tablet Commonly known as: Lasix  Take 1 tablet (20 mg total) by mouth 2 (two) times daily. What  changed:  medication strength how much to take additional instructions   ipratropium-albuterol  0.5-2.5 (3) MG/3ML Soln Commonly known as: DUONEB Take 3 mLs by nebulization every 6 (six) hours as needed (for shortness of breath).   levocetirizine 5 MG tablet Commonly known as: XYZAL Take 5 mg by mouth every evening.  midodrine  10 MG tablet Commonly known as: PROAMATINE  Take 1 tablet (10 mg total) by mouth 3 (three) times daily with meals.   montelukast  10 MG tablet Commonly known as: SINGULAIR  Take 10 mg by mouth every evening.   nicotine  14 mg/24hr patch Commonly known as: NICODERM CQ  - dosed in mg/24 hours Place 1 patch (14 mg total) onto the skin daily. Start taking on: August 17, 2023   tamsulosin 0.4 MG Caps capsule Commonly known as: FLOMAX Take 1 capsule (0.4 mg total) by mouth daily after supper.       Major procedures and Radiology Reports - PLEASE review detailed and final reports for all details, in brief -   DG Chest 1 View Result Date: 08/15/2023 CLINICAL DATA:  Post thoracentesis EXAM: CHEST  1 VIEW COMPARISON:  08/14/2023 FINDINGS: Moderate left pleural effusion, decreased since prior study. No pneumothorax. Left base atelectasis. Mild cardiomegaly and vascular congestion. IMPRESSION: Decreasing left pleural effusion following thoracentesis. No pneumothorax. Left base atelectasis. Cardiomegaly, vascular congestion. Electronically Signed   By: Janeece Mechanic M.D.   On: 08/15/2023 12:43   US  THORACENTESIS ASP PLEURAL SPACE W/IMG GUIDE Result Date: 08/15/2023 INDICATION: 78 year old male. History of COPD and CHF. Admitted for altered mental status. Found to be hypoxic with a left-sided pleural effusion. Request is for therapeutic and diagnostic left-sided thoracentesis EXAM: ULTRASOUND GUIDED THERAPEUTIC AND DIAGNOSTIC LEFT-SIDED THORACENTESIS MEDICATIONS: Lidocaine  1% 10 mL COMPLICATIONS: None immediate. PROCEDURE: An ultrasound guided thoracentesis was thoroughly  discussed with the patient and questions answered. The benefits, risks, alternatives and complications were also discussed. The patient understands and wishes to proceed with the procedure. Written consent was obtained. Ultrasound was performed to localize and mark an adequate pocket of fluid in the left chest. The area was then prepped and draped in the normal sterile fashion. 1% Lidocaine  was used for local anesthesia. Under ultrasound guidance a 8 Fr Safe-T-Centesis catheter was introduced. Thoracentesis was performed. The catheter was removed and a dressing applied. FINDINGS: A total of approximately 1.4 L of amber colored fluid was removed. Samples were sent to the laboratory as requested by the clinical team. Patient able to tolerate additional fluid removal at this time. IMPRESSION: Successful ultrasound guided left-sided therapeutic and diagnostic thoracentesis yielding 1.4 L of amber colored pleural fluid. Performed by Reagan Camera NP Electronically Signed   By: Fernando Hoyer M.D.   On: 08/15/2023 12:35   DG Chest Port 1 View Result Date: 08/14/2023 EXAM: 1 VIEW XRAY OF THE CHEST 08/14/2023 12:55:00 AM COMPARISON: 12/08/2022 CLINICAL HISTORY: SOB. Pt BIB EMS from home with c/o weakness all week, wife states that she is worried he has been having TIAs all day. Per EMS, pt became more weak and pale when they attempted to stand him for orthostatic. Has left sided facial droop from previous stroke. Hx of asthma, CAD, hypertension. Current everyday smoker. FINDINGS: LUNGS AND PLEURA: Left lower lobe opacity, likely atelectasis. Moderate left pleural effusion, new/progressive. HEART AND MEDIASTINUM: Mild cardiomegaly. Thoracic aortic atherosclerosis. BONES AND SOFT TISSUES: No acute osseous abnormality. IMPRESSION: 1. Moderate left pleural effusion, new/progressive. 2. Mild cardiomegaly. Electronically signed by: Zadie Herter MD 08/14/2023 01:00 AM EDT RP Workstation: [id number]   CT Head Wo  Contrast Result Date: 08/13/2023 EXAM: CT HEAD WITHOUT 08/13/2023 09:56:32 PM TECHNIQUE: CT of the head was performed without the administration of intravenous contrast. Automated exposure control, iterative reconstruction, and/or weight based adjustment of the mA/kV was utilized to reduce the radiation dose to as low as reasonably achievable. COMPARISON:  08/22/2017 CLINICAL HISTORY: Memory loss. c/o weakness all week, wife states that she is worried he has been having TIAs all day. Per ems, pt became more weak and pale when they attempted to stand him for orthostatic. Has left sided facial droop from previous stroke. FINDINGS: BRAIN AND VENTRICLES: Global cortical atrophy. Subcortical and periventricular small vessel ischemic changes. There is no acute intracranial hemorrhage, mass effect or midline shift. No abnormal extra-axial fluid collection. The gray-white differentiation is maintained without evidence of an acute infarct. There is no evidence of hydrocephalus. ORBITS: The visualized portion of the orbits demonstrate no acute abnormality. SINUSES: The visualized paranasal sinuses and mastoid air cells demonstrate no acute abnormality. SOFT TISSUES AND SKULL: No acute abnormality of the visualized skull or soft tissues. IMPRESSION: 1. No acute intracranial abnormality. Electronically signed by: Zadie Herter MD 08/13/2023 10:01 PM EDT RP Workstation: [id number]   Micro Results   Recent Results (from the past 240 hours)  Culture, body fluid w Gram Stain-bottle     Status: None (Preliminary result)   Collection Time: 08/15/23 11:20 AM   Specimen: Pleura  Result Value Ref Range Status   Specimen Description PLEURAL  Final   Special Requests   Final    BOTTLES DRAWN AEROBIC AND ANAEROBIC Blood Culture adequate volume   Culture   Final    NO GROWTH < 24 HOURS Performed at [facility], [address], [place], [place] [address]    Report Status PENDING  Incomplete  Gram stain     Status: None    Collection Time: 08/15/23 11:20 AM   Specimen: Pleura  Result Value Ref Range Status   Specimen Description PLEURAL  Final   Special Requests PLEURAL  Final   Gram Stain   Final    NO ORGANISMS SEEN WBC PRESENT, PREDOMINANTLY MONONUCLEAR CYTOSPIN SMEAR Performed at [facility], [address] [place], [place] [address]    Report Status 08/15/2023 FINAL  Final   Today   Subjective    Boaz Berisha today has no new complaints  Siting up in the chair         Wife at bedside, questions answered -No dyspnea at rest -Dyspnea on exertion is somewhat improved  Patient has been seen and examined prior to discharge   Objective   Blood pressure (!) 89/80, pulse 82, temperature 98.4 F (36.9 C), temperature source Oral, resp. rate 18, height 5\' 11"  (1.803 m), weight 74.1 kg, SpO2 90%.   Intake/Output Summary (Last 24 hours) at 08/16/2023 1345 Last data filed at 08/16/2023 0930 Gross per 24 hour  Intake 480 ml  Output 1050 ml  Net -570 ml   Exam Gen:- Awake Alert, no acute distress .,  HEENT:- [place].AT, No sclera icterus Neck-Supple Neck,No JVD,.  Lungs-improved air movement, no wheezing  CV- S1, S2 normal, regular, 3/6 SM Abd-  +ve B.Sounds, Abd Soft, No tenderness,    Extremity/Skin:- No  edema,   good pulses Psych-affect is appropriate, oriented x3 Neuro-generalized weakness, no new focal deficits, no tremors    Data Review   CBC w Diff:  Lab Results  Component Value Date   WBC 6.6 08/13/2023   HGB 13.3 08/13/2023   HGB 13.0 06/21/2023   HCT 41.3 08/13/2023   HCT 39.7 06/21/2023   PLT 243 08/13/2023   PLT 297 06/21/2023   LYMPHOPCT 13 08/13/2023   LYMPHOPCT 11.6 04/16/2013   MONOPCT 8 08/13/2023   MONOPCT 8.7 04/16/2013   EOSPCT 3 08/13/2023   EOSPCT 1.5 04/16/2013  BASOPCT 1 08/13/2023   BASOPCT 0.7 04/16/2013   CMP:  Lab Results  Component Value Date   NA 138 08/15/2023   NA 143 06/21/2023   K 3.7 08/15/2023   CL 98 08/15/2023   CO2 30 08/15/2023   BUN  35 (H) 08/15/2023   BUN 34 (H) 06/21/2023   CREATININE 1.62 (H) 08/15/2023   PROT 6.1 (L) 08/13/2023   ALBUMIN 2.7 (L) 08/13/2023   BILITOT 1.0 08/13/2023   ALKPHOS 92 08/13/2023   AST 43 (H) 08/13/2023   ALT 35 08/13/2023   Total Discharge time is about 33 minutes  Colin Dawley M.D on 08/16/2023 at 1:45 PM  Go to [email or web address] -  for contact info  [facility] - Office  [phone]

## 2023-08-16 NOTE — TOC Transition Note (Signed)
 Transition of Care [facility]) - Discharge Note   Patient Details  Name: Tommy Knight MRN: [id number] Date of Birth: 05/16/1945  Transition of Care [facility]) CM/SW Contact:  Orelia Binet, RN Phone Number: 08/16/2023, 2:10 PM   Clinical Narrative:   Moshe Ares with [facility] is here to meet with patient's daughter. Family wants the patient home this evening. They do need equipment, [facility] ordered 3N1, oxygen, Wheel chair, and hospital bed. The equipment is scheduled to be delivered at [facility]. [facility] called EMS they do have a night truck scheduled. Team updated we will need to call EMS when confirmed DME delivery.     Final next level of care: Home w Hospice Care Barriers to Discharge:  (Waiting for DME to be delivered)   Patient Goals and CMS Choice Patient states their goals for this hospitalization and ongoing recovery are:: Hospice at home CMS Medicare.gov Compare Post Acute Care list provided to:: Patient Choice offered to / list presented to : Spouse      Discharge Placement                Patient to be transferred to facility by: EMS   Patient and family notified of of transfer: 08/16/23  Discharge Plan and Services Additional resources added to the After Visit Summary for   In-house Referral: Hospice / Palliative Care, Clinical Social Work Discharge Planning Services: CM Consult Post Acute Care Choice: Durable Medical Equipment          DME Arranged: N/A DME Agency: NA       HH Arranged: NA HH Agency: [facility]        Social Drivers of Health (SDOH) Interventions SDOH Screenings   Food Insecurity: No Food Insecurity (08/14/2023)  Housing: Low Risk  (08/14/2023)  Transportation Needs: No Transportation Needs (08/14/2023)  Utilities: Not At Risk (08/14/2023)  Social Connections: Unknown (08/14/2023)  Tobacco Use: High Risk (08/13/2023)     Readmission Risk Interventions    08/15/2023    1:35 PM  Readmission Risk Prevention Plan  Transportation  Screening Complete  Home Care Screening Complete  Medication Review (RN CM) Complete

## 2023-08-16 NOTE — Progress Notes (Signed)
 Nutrition Brief Note  Chart reviewed. Pt now transitioning to comfort care.  No further nutrition interventions planned at this time.  Please re-consult as needed.   Levada Schilling, RD, LDN, CDCES Registered Dietitian III Certified Diabetes Care and Education Specialist If unable to reach this RD, please use "RD Inpatient" group chat on secure chat between hours of 8am-4 pm daily

## 2023-08-16 NOTE — Plan of Care (Signed)
  Problem: Education: Goal: Knowledge of General Education information will improve Description: Including pain rating scale, medication(s)/side effects and non-pharmacologic comfort measures Outcome: Adequate for Discharge   Problem: Health Behavior/Discharge Planning: Goal: Ability to manage health-related needs will improve Outcome: Adequate for Discharge   Problem: Clinical Measurements: Goal: Ability to maintain clinical measurements within normal limits will improve Outcome: Adequate for Discharge Goal: Will remain free from infection Outcome: Adequate for Discharge Goal: Diagnostic test results will improve Outcome: Adequate for Discharge Goal: Respiratory complications will improve Outcome: Adequate for Discharge Goal: Cardiovascular complication will be avoided Outcome: Adequate for Discharge   Problem: Activity: Goal: Risk for activity intolerance will decrease Outcome: Adequate for Discharge   Problem: Nutrition: Goal: Adequate nutrition will be maintained Outcome: Adequate for Discharge   Problem: Coping: Goal: Level of anxiety will decrease Outcome: Adequate for Discharge   Problem: Elimination: Goal: Will not experience complications related to bowel motility Outcome: Adequate for Discharge Goal: Will not experience complications related to urinary retention Outcome: Adequate for Discharge   Problem: Pain Managment: Goal: General experience of comfort will improve and/or be controlled Outcome: Adequate for Discharge   Problem: Safety: Goal: Ability to remain free from injury will improve Outcome: Adequate for Discharge   Problem: Skin Integrity: Goal: Risk for impaired skin integrity will decrease Outcome: Adequate for Discharge   Problem: Education: Goal: Knowledge of the prescribed therapeutic regimen will improve Outcome: Adequate for Discharge   Problem: Coping: Goal: Ability to identify and develop effective coping behavior will  improve Outcome: Adequate for Discharge   Problem: Clinical Measurements: Goal: Quality of life will improve Outcome: Adequate for Discharge   Problem: Respiratory: Goal: Verbalizations of increased ease of respirations will increase Outcome: Adequate for Discharge   Problem: Role Relationship: Goal: Family's ability to cope with current situation will improve Outcome: Adequate for Discharge Goal: Ability to verbalize concerns, feelings, and thoughts to partner or family member will improve Outcome: Adequate for Discharge   Problem: Pain Management: Goal: Satisfaction with pain management regimen will improve Outcome: Adequate for Discharge

## 2023-08-16 NOTE — Progress Notes (Signed)
   08/16/23 0800  ReDS Vest / Clip  Station Marker D  Ruler Value 36  ReDS Value Range 36 - 40  ReDS Actual Value  (low quality)  Anatomical Comments  (low quality)   Unable to obtain. Low quality.

## 2023-08-16 NOTE — Progress Notes (Signed)
 Palliative:  HPI: 77 y.o. 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 Found to have acute on chronic combined systolic and diastolic heart failure.   I met today with Mr. Tommy Knight and wife, [name], at bedside. [name] is sitting on side of bed and eating some breakfast. He is feeling much better after thoracentesis. [name] asks about going home with hospice and I agree this will be a better option for them. All are on board with hospice at home. They are still hopeful to go home today - Mr. Fiola is anxious to get home. He is more alert but still has poor insight and understanding into his condition. [name] is happy with plan for discharge home with hospice.   All questions/concerns addressed. Emotional support provided. Discussed with Dr. Quintella Buck, [facility], hospice liaison.   Exam: Awake, alert. Ill-appearing. No distress. Sitting on side of bed. Abd flat. Moves all extremities.   Plan: - DNR/DNI - Home with hospice  25 min  Vila Grayer, NP Palliative Medicine Team Pager [phone] (Please see [email or web address] for schedule) Team Phone [phone]

## 2023-08-16 NOTE — Discharge Instructions (Signed)
 1)Very Low-salt diet advised---Less than 2 gm of Sodium per day advised----ok to use Mrs DASH salt substitute instead of Salt 2)Weigh yourself daily, call if you gain more than 3 pounds in 1 day or more than 5 pounds in 1 week as your diuretic medications may need to be adjusted 3)Limit your Fluid  intake to no more than 60 ounces (1.8 Liters) per day 4)follow up with Hospice Team

## 2023-08-16 NOTE — Progress Notes (Signed)
 Cristine Done rm [address] [facility] Liaison RN note:  Received request from [facility] for hospice services at home after discharge. Chart and patient information under review by Hospice physician. Hospice eligibility has been confirmed.  Spoke with patient and family at bedside to initiate education related to hospice philosophy, services, and team approach to care. Patient/family verbalized understanding of information given.   Per discussion, the plan is for discharge home by EMS today after DME delivery.  DME needs discussed. Patient has the following equipment in the home. Walker and rollator.  Patient/family requests the following equipment for deliver: oxygen via nasal cannula, hospital bed, overbed table and BSC.  Address has been verified and is correct in the chart.   Please send signed and completed DNR with patient/family. Please provide prescriptions at discharge as needed for ongoing symptom management.   [facility] information and contact numbers given to family. Above information shared with Ivette Marks.   Please call with any hospice related questions or concerns.  Thank you for the opportunity to participate in this patient's care.  Dwane Gitelman, Charity fundraiser, BSN [employer] [phone]

## 2023-08-17 LAB — CYTOLOGY - NON PAP

## 2023-08-20 LAB — CULTURE, BODY FLUID W GRAM STAIN -BOTTLE
Culture: NO GROWTH
Special Requests: ADEQUATE

## 2023-08-29 ENCOUNTER — Ambulatory Visit: Admitting: Internal Medicine

## 2023-08-30 ENCOUNTER — Other Ambulatory Visit: Admitting: Urology

## 2023-09-01 ENCOUNTER — Ambulatory Visit: Admitting: Student

## 2023-09-26 ENCOUNTER — Ambulatory Visit: Admitting: Internal Medicine

## 2023-10-12 NOTE — Progress Notes (Deleted)
  Cardiology Office Note:  .   Date:  10/12/2023  ID:  Tommy Knight, DOB 1945-08-16, MRN [id number] PCP: Johnson Morna FALCON, NP  [facility] Providers Cardiologist:  Diannah SHAUNNA Maywood, MD {  History of Present Illness: .   Tommy Knight is a 78 y.o. male  with PMHx of PAD, mild to mod CAD, chronic HFrEF, severe AS not a TAVR candidate who reports to [facility] office for follow up.   Recent hospitalization with [facility]care cardiology consult 08/15/2023 with Dr. Alvan for evaluation of SOB.  Diagnosed with acute on chronic HFrEF with BNP  >4500. Complicated by left pleural effusion treated with left-sided thoracentesis. No repeat echo since benefit management. Medication management complicated by soft BPs. Received IV diuresis and midodrine  to support BP.  Recommended diuresis to improve symptoms but overall poor short-term prognosis and encouraged palliative care evaluation.  Ultimately transitioned to comfort care and discharged home on 6/3 with hospice at home.  Today, reports ### and denies ###.  Reports compliance with medications.  Dietary habitats:  Activity level:  Social: Denies tobacco use/alcohol /drug use  Denies any recent hospitalizations or visits to the emergency department.   ROS: 10 point review of system has been reviewed and considered negative except ones been listed in the HPI.   Studies Reviewed: .        *** Risk Assessment/Calculations:   {Does this patient have ATRIAL FIBRILLATION?:[phone]} No BP recorded.  {Refresh Note OR Click here to enter BP  :1}***       Physical Exam:   VS:  There were no vitals taken for this visit.   Wt Readings from Last 3 Encounters:  08/15/23 163 lb 5.8 oz (74.1 kg)  08/10/23 165 lb (74.8 kg)  06/21/23 175 lb 4 oz (79.5 kg)    GEN: Well nourished, well developed in no acute distress while sitting in chair.  NECK: No JVD; No carotid bruits CARDIAC: ***RRR, no murmurs, rubs, gallops RESPIRATORY:  Clear to auscultation  without rales, wheezing or rhonchi  ABDOMEN: Soft, non-tender, non-distended EXTREMITIES:  No edema; No deformity   ASSESSMENT AND PLAN: .   ***    {Are you ordering a CV Procedure (e.g. stress test, cath, DCCV, TEE, etc)?   Press F2        :[id number]}  Dispo: ***  Signed, Lorette CINDERELLA Kapur, PA-C

## 2023-10-13 ENCOUNTER — Ambulatory Visit: Admitting: Student

## 2023-10-20 NOTE — Telephone Encounter (Signed)
 SABRA

## 2023-11-14 DEATH — deceased
